# Patient Record
Sex: Female | Born: 1950 | Race: White | Hispanic: No | Marital: Married | Smoking: Never smoker
Health system: Southern US, Community
[De-identification: ages and names within clinical notes are randomized; demographics above are authoritative.]

## PROBLEM LIST (undated history)

## (undated) DIAGNOSIS — N6019 Diffuse cystic mastopathy of unspecified breast: Secondary | ICD-10-CM

## (undated) DIAGNOSIS — I10 Essential (primary) hypertension: Secondary | ICD-10-CM

## (undated) DIAGNOSIS — K589 Irritable bowel syndrome without diarrhea: Secondary | ICD-10-CM

## (undated) DIAGNOSIS — N029 Recurrent and persistent hematuria with unspecified morphologic changes: Secondary | ICD-10-CM

## (undated) DIAGNOSIS — G4733 Obstructive sleep apnea (adult) (pediatric): Secondary | ICD-10-CM

## (undated) DIAGNOSIS — H269 Unspecified cataract: Secondary | ICD-10-CM

## (undated) DIAGNOSIS — G4730 Sleep apnea, unspecified: Secondary | ICD-10-CM

## (undated) DIAGNOSIS — T7840XA Allergy, unspecified, initial encounter: Secondary | ICD-10-CM

## (undated) DIAGNOSIS — E785 Hyperlipidemia, unspecified: Secondary | ICD-10-CM

## (undated) HISTORY — DX: Irritable bowel syndrome, unspecified: K58.9

## (undated) HISTORY — DX: Recurrent and persistent hematuria with unspecified morphologic changes: N02.9

## (undated) HISTORY — DX: Hyperlipidemia, unspecified: E78.5

## (undated) HISTORY — DX: Obstructive sleep apnea (adult) (pediatric): G47.33

## (undated) HISTORY — DX: Essential (primary) hypertension: I10

## (undated) HISTORY — DX: Sleep apnea, unspecified: G47.30

## (undated) HISTORY — DX: Unspecified cataract: H26.9

## (undated) HISTORY — DX: Allergy, unspecified, initial encounter: T78.40XA

---
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
## 2007-07-24 ENCOUNTER — Encounter: Payer: Self-pay | Admitting: Primary Care

## 2007-09-07 LAB — HM COLONOSCOPY

## 2015-10-23 ENCOUNTER — Encounter: Payer: Self-pay | Admitting: Primary Care

## 2016-06-05 LAB — COLOGUARD: Cologuard: NEGATIVE

## 2016-07-08 LAB — COLOGUARD: Cologuard: NEGATIVE

## 2016-09-25 ENCOUNTER — Encounter: Payer: Self-pay | Admitting: Primary Care

## 2017-02-20 ENCOUNTER — Encounter (INDEPENDENT_AMBULATORY_CARE_PROVIDER_SITE_OTHER): Payer: Self-pay

## 2017-02-20 ENCOUNTER — Ambulatory Visit (INDEPENDENT_AMBULATORY_CARE_PROVIDER_SITE_OTHER): Payer: Medicare Other | Admitting: Primary Care

## 2017-02-20 ENCOUNTER — Encounter: Payer: Self-pay | Admitting: Primary Care

## 2017-02-20 VITALS — BP 124/74 | HR 78 | Temp 98.3°F | Ht 63.5 in | Wt 177.4 lb

## 2017-02-20 DIAGNOSIS — E785 Hyperlipidemia, unspecified: Secondary | ICD-10-CM | POA: Diagnosis not present

## 2017-02-20 DIAGNOSIS — I10 Essential (primary) hypertension: Secondary | ICD-10-CM | POA: Insufficient documentation

## 2017-02-20 DIAGNOSIS — R739 Hyperglycemia, unspecified: Secondary | ICD-10-CM

## 2017-02-20 DIAGNOSIS — K580 Irritable bowel syndrome with diarrhea: Secondary | ICD-10-CM | POA: Diagnosis not present

## 2017-02-20 DIAGNOSIS — K589 Irritable bowel syndrome without diarrhea: Secondary | ICD-10-CM | POA: Insufficient documentation

## 2017-02-20 LAB — LIPID PANEL
Cholesterol: 215 mg/dL — ABNORMAL HIGH (ref 0–200)
HDL: 41.4 mg/dL (ref >39.00)
LDL Cholesterol: 136 mg/dL — ABNORMAL HIGH (ref 0–99)
NONHDL: 173.2
Total CHOL/HDL Ratio: 5
Triglycerides: 186 mg/dL — ABNORMAL HIGH (ref 0.0–149.0)
VLDL: 37.2 mg/dL (ref 0.0–40.0)

## 2017-02-20 LAB — COMPREHENSIVE METABOLIC PANEL
ALT: 59 U/L — AB (ref 0–35)
AST: 41 U/L — AB (ref 0–37)
Albumin: 4.3 g/dL (ref 3.5–5.2)
Alkaline Phosphatase: 33 U/L — ABNORMAL LOW (ref 39–117)
BILIRUBIN TOTAL: 0.6 mg/dL (ref 0.2–1.2)
BUN: 17 mg/dL (ref 6–23)
CO2: 28 meq/L (ref 19–32)
CREATININE: 0.82 mg/dL (ref 0.40–1.20)
Calcium: 9.5 mg/dL (ref 8.4–10.5)
Chloride: 102 mEq/L (ref 96–112)
GFR: 74.26 mL/min (ref >60.00)
GLUCOSE: 141 mg/dL — AB (ref 70–99)
Potassium: 3.5 mEq/L (ref 3.5–5.1)
Sodium: 139 mEq/L (ref 135–145)
TOTAL PROTEIN: 6.7 g/dL (ref 6.0–8.3)

## 2017-02-20 LAB — HEMOGLOBIN A1C: HEMOGLOBIN A1C: 5.9 % (ref 4.6–6.5)

## 2017-02-20 NOTE — Progress Notes (Signed)
Subjective:    Patient ID: Amy Wilcox, female    DOB: October 22, 1950, 66 y.o.   MRN: [id number]  HPI  Amy Wilcox is a 66 year old female who presents today to establish care and discuss the problems mentioned below. Will obtain old records. She moved from [place] recently. Mammogram completed in   1) Hyperlipidemia: Currently managed on Zetia 10 mg. She cannot tolerate statins due to major myalgias. She's tried five different statins including Livalo. Her last lipid panel was in April 2018 with TC and LDL above goal. She started Zetia in early 2018.   2) Essential Hypertension: Currently managed on valsartan-HCTZ 80-12.5 mg and Toprol XL 50 mg. Her BP in the office is 124/74. She denies chest pain, shortness of breath. She's not checked to see if her Valsartan is on the recall list. She did recently pick up her Valsartan and was not told by the pharmacist that her medication was recalled.  3) IBS Diarrhea Type: Previously seeing GI and was once managed on Vibrizi. She will take Imodium as needed. Diarrhea is daily, sometimes several times daily. Overall she feels well managed on Imodium.   4) OSA: Diagnosed in 2003. Compliant to her CPAP machine since 2003. Her recent settings were evaluated in March 2018.  Review of Systems  Constitutional: Negative for fatigue.  Eyes: Negative for visual disturbance.  Respiratory: Negative for shortness of breath.        OSA  Cardiovascular: Negative for chest pain.  Gastrointestinal:       IBS  Neurological: Negative for dizziness and headaches.       Past Medical History:  Diagnosis Date  . Essential hypertension   . Hyperlipidemia      Social History   Social History  . Marital status: Married    Spouse name: N/A  . Number of children: N/A  . Years of education: N/A   Occupational History  . Not on file.   Social History Main Topics  . Smoking status: Not on file  . Smokeless tobacco: Not on file  . Alcohol use Not on file  . Drug  use: Unknown  . Sexual activity: Not on file   Other Topics Concern  . Not on file   Social History Narrative   Married.   3 children.   Retired. Once worked a Agricultural engineer.   Enjoys spending time with family.    Past Surgical History:  Procedure Laterality Date  . TONSILLECTOMY AND ADENOIDECTOMY  1961    Family History  Problem Relation Age of Onset  . Hypertension Mother   . Hyperlipidemia Mother   . Alzheimer's disease Mother   . Heart disease Father   . Heart attack Father   . Hyperlipidemia Father   . Hypertension Father     Allergies  Allergen Reactions  . Livalo [Pitavastatin]   . Statins     No current outpatient prescriptions on file prior to visit.   No current facility-administered medications on file prior to visit.     BP 124/74   Pulse 78   Temp 98.3 F (36.8 C) (Oral)   Ht 5' 3.5" (1.613 m)   Wt 177 lb 6.4 oz (80.5 kg)   SpO2 96%   BMI 30.93 kg/m    Objective:   Physical Exam  Constitutional: She appears well-nourished.  Neck: Neck supple.  Cardiovascular: Normal rate and regular rhythm.   Pulmonary/Chest: Effort normal and breath sounds normal.  Skin: Skin is warm and dry.  Psychiatric: She has a normal mood and affect.          Assessment & Plan:

## 2017-02-20 NOTE — Assessment & Plan Note (Signed)
TC and LDL above goal in April 2018 based off of records she brought today. Intolerant to statins. Check lipid panel today. Continue Zetia. Discussed the importance of a healthy diet and regular exercise in order for weight loss, and to reduce the risk of other medical problems.

## 2017-02-20 NOTE — Patient Instructions (Signed)
Complete lab work prior to leaving today.   I will be in touch with you regarding your next follow up visit based off of your lab results.  It was a pleasure to meet you today! Please don't hesitate to call me with any questions. Welcome to [facility]!

## 2017-02-20 NOTE — Assessment & Plan Note (Signed)
With diarrhea. Taking Imodium PRN, feels well managed.

## 2017-02-20 NOTE — Assessment & Plan Note (Signed)
Stable in the office today. Recall list provided for Valsartan check, she will check with the pharmacist. Continue valsarta-HCTZ and Toprol XL. BMP pending.

## 2017-03-17 ENCOUNTER — Other Ambulatory Visit: Payer: Self-pay

## 2017-03-17 MED ORDER — METOPROLOL SUCCINATE ER 50 MG PO TB24: 50.0000 mg | ORAL_TABLET | Freq: Every day | ORAL | 3 refills | Status: DC

## 2017-03-17 MED ORDER — EZETIMIBE 10 MG PO TABS: 10.0000 mg | ORAL_TABLET | Freq: Every day | ORAL | 3 refills | Status: DC

## 2017-03-17 MED ORDER — VALSARTAN-HYDROCHLOROTHIAZIDE 80-12.5 MG PO TABS: 1.0000 | ORAL_TABLET | Freq: Every day | ORAL | 3 refills | Status: DC

## 2017-03-17 NOTE — Telephone Encounter (Signed)
Pt established care on 02/20/17; pt has lab appt on 05/26/17. Per office note pt to continue zetia, metoprolol succinate and valsartan HCTZ 80-12.5 (pt checked and her valsartan HCTZ has not been recalled). Due to ins pt request # 30 day rx. Refilled per protocol # 30 on each with 3 refills and could be updated after lab appt on 05/26/17. Pt voiced understanding. FYI to [name] NP to verify OK. [facility]

## 2017-04-25 ENCOUNTER — Encounter: Payer: Self-pay | Admitting: Primary Care

## 2017-05-18 ENCOUNTER — Other Ambulatory Visit: Payer: Self-pay | Admitting: Primary Care

## 2017-05-18 DIAGNOSIS — E119 Type 2 diabetes mellitus without complications: Secondary | ICD-10-CM | POA: Insufficient documentation

## 2017-05-18 DIAGNOSIS — R945 Abnormal results of liver function studies: Secondary | ICD-10-CM

## 2017-05-18 DIAGNOSIS — E1165 Type 2 diabetes mellitus with hyperglycemia: Secondary | ICD-10-CM | POA: Insufficient documentation

## 2017-05-18 DIAGNOSIS — K760 Fatty (change of) liver, not elsewhere classified: Secondary | ICD-10-CM | POA: Insufficient documentation

## 2017-05-18 DIAGNOSIS — R7989 Other specified abnormal findings of blood chemistry: Secondary | ICD-10-CM

## 2017-05-18 DIAGNOSIS — R7303 Prediabetes: Secondary | ICD-10-CM | POA: Insufficient documentation

## 2017-05-18 DIAGNOSIS — E785 Hyperlipidemia, unspecified: Secondary | ICD-10-CM

## 2017-05-18 NOTE — Assessment & Plan Note (Signed)
A1C of 5.9 on prior labs in August 2018. Encouraged healthy diet and regular exercise. Repeat A1C pending.

## 2017-05-18 NOTE — Assessment & Plan Note (Signed)
Noted on labs from August 2018. Repeat LFT's pending. Consider liver US if no improvement from diet changes. Not currently on statin.

## 2017-05-26 ENCOUNTER — Other Ambulatory Visit (INDEPENDENT_AMBULATORY_CARE_PROVIDER_SITE_OTHER): Payer: Medicare Other

## 2017-05-26 DIAGNOSIS — R7303 Prediabetes: Secondary | ICD-10-CM

## 2017-05-26 DIAGNOSIS — E785 Hyperlipidemia, unspecified: Secondary | ICD-10-CM | POA: Diagnosis not present

## 2017-05-26 DIAGNOSIS — R945 Abnormal results of liver function studies: Secondary | ICD-10-CM | POA: Diagnosis not present

## 2017-05-26 DIAGNOSIS — R7989 Other specified abnormal findings of blood chemistry: Secondary | ICD-10-CM

## 2017-05-26 LAB — LIPID PANEL
CHOL/HDL RATIO: 5
Cholesterol: 232 mg/dL — ABNORMAL HIGH (ref 0–200)
HDL: 42.4 mg/dL (ref >39.00)
NONHDL: 189.68
Triglycerides: 202 mg/dL — ABNORMAL HIGH (ref 0.0–149.0)
VLDL: 40.4 mg/dL — ABNORMAL HIGH (ref 0.0–40.0)

## 2017-05-26 LAB — HEPATIC FUNCTION PANEL
ALBUMIN: 4.4 g/dL (ref 3.5–5.2)
ALK PHOS: 35 U/L — AB (ref 39–117)
ALT: 45 U/L — ABNORMAL HIGH (ref 0–35)
AST: 32 U/L (ref 0–37)
BILIRUBIN DIRECT: 0.1 mg/dL (ref 0.0–0.3)
BILIRUBIN TOTAL: 0.8 mg/dL (ref 0.2–1.2)
Total Protein: 7.2 g/dL (ref 6.0–8.3)

## 2017-05-26 LAB — HEMOGLOBIN A1C: Hgb A1c MFr Bld: 5.9 % (ref 4.6–6.5)

## 2017-05-26 LAB — LDL CHOLESTEROL, DIRECT: Direct LDL: 162 mg/dL

## 2017-09-19 ENCOUNTER — Other Ambulatory Visit: Payer: Self-pay | Admitting: Primary Care

## 2017-09-19 DIAGNOSIS — I10 Essential (primary) hypertension: Secondary | ICD-10-CM

## 2017-09-19 DIAGNOSIS — E785 Hyperlipidemia, unspecified: Secondary | ICD-10-CM

## 2017-09-19 DIAGNOSIS — Z1159 Encounter for screening for other viral diseases: Secondary | ICD-10-CM

## 2017-09-19 DIAGNOSIS — R7303 Prediabetes: Secondary | ICD-10-CM

## 2017-09-22 ENCOUNTER — Ambulatory Visit (INDEPENDENT_AMBULATORY_CARE_PROVIDER_SITE_OTHER): Payer: Medicare Other

## 2017-09-22 VITALS — BP 128/82 | HR 98 | Temp 98.9°F | Ht 62.5 in | Wt 177.5 lb

## 2017-09-22 DIAGNOSIS — R7303 Prediabetes: Secondary | ICD-10-CM | POA: Diagnosis not present

## 2017-09-22 DIAGNOSIS — I10 Essential (primary) hypertension: Secondary | ICD-10-CM | POA: Diagnosis not present

## 2017-09-22 DIAGNOSIS — E785 Hyperlipidemia, unspecified: Secondary | ICD-10-CM | POA: Diagnosis not present

## 2017-09-22 DIAGNOSIS — Z1159 Encounter for screening for other viral diseases: Secondary | ICD-10-CM

## 2017-09-22 DIAGNOSIS — Z0000 Encounter for general adult medical examination without abnormal findings: Secondary | ICD-10-CM

## 2017-09-22 LAB — COMPREHENSIVE METABOLIC PANEL
ALBUMIN: 4.5 g/dL (ref 3.5–5.2)
ALK PHOS: 32 U/L — AB (ref 39–117)
ALT: 54 U/L — ABNORMAL HIGH (ref 0–35)
AST: 35 U/L (ref 0–37)
BUN: 22 mg/dL (ref 6–23)
CALCIUM: 10.3 mg/dL (ref 8.4–10.5)
CHLORIDE: 101 meq/L (ref 96–112)
CO2: 30 mEq/L (ref 19–32)
Creatinine, Ser: 0.93 mg/dL (ref 0.40–1.20)
GFR: 64.1 mL/min (ref >60.00)
Glucose, Bld: 150 mg/dL — ABNORMAL HIGH (ref 70–99)
POTASSIUM: 3.8 meq/L (ref 3.5–5.1)
Sodium: 142 mEq/L (ref 135–145)
Total Bilirubin: 0.8 mg/dL (ref 0.2–1.2)
Total Protein: 7.4 g/dL (ref 6.0–8.3)

## 2017-09-22 LAB — LIPID PANEL
CHOL/HDL RATIO: 5
CHOLESTEROL: 231 mg/dL — AB (ref 0–200)
HDL: 43.7 mg/dL (ref >39.00)
NonHDL: 187.43
Triglycerides: 216 mg/dL — ABNORMAL HIGH (ref 0.0–149.0)
VLDL: 43.2 mg/dL — AB (ref 0.0–40.0)

## 2017-09-22 LAB — HEMOGLOBIN A1C: Hgb A1c MFr Bld: 6 % (ref 4.6–6.5)

## 2017-09-22 LAB — LDL CHOLESTEROL, DIRECT: Direct LDL: 178 mg/dL

## 2017-09-22 NOTE — Patient Instructions (Signed)
Amy Wilcox , Thank you for taking time to come for your Medicare Wellness Visit. I appreciate your ongoing commitment to your health goals. Please review the following plan we discussed and let me know if I can assist you in the future.   These are the goals we discussed: Goals    . DIET - INCREASE WATER INTAKE     Starting 09/22/2017, I will attempt to drink at least 3 glasses of water daily. Goal is 6-8 glasses of water daily.        This is a list of the screening recommended for you and due dates:  Health Maintenance  Topic Date Due  . Flu Shot  09/29/2017*  . DEXA scan (bone density measurement)  09/23/2018*  . Tetanus Vaccine  09/23/2018*  . Pneumonia vaccines (1 of 2 - PCV13) 09/23/2018*  . Mammogram  10/15/2018  . Colon Cancer Screening  01/22/2026  .  Hepatitis C: One time screening is recommended by Center for Disease Control  (CDC) for  adults born from [birth year] through 1964.   Completed  *Topic was postponed. The date shown is not the original due date.   Preventive Care for Adults  A healthy lifestyle and preventive care can promote health and wellness. Preventive health guidelines for adults include the following key practices.  . A routine yearly physical is a good way to check with your health care provider about your health and preventive screening. It is a chance to share any concerns and updates on your health and to receive a thorough exam.  . Visit your dentist for a routine exam and preventive care every 6 months. Brush your teeth twice a day and floss once a day. Good oral hygiene prevents tooth decay and gum disease.  . The frequency of eye exams is based on your age, health, family medical history, use  of contact lenses, and other factors. Follow your health care provider's recommendations for frequency of eye exams.  . Eat a healthy diet. Foods like vegetables, fruits, whole grains, low-fat dairy products, and lean protein foods contain the nutrients you need  without too many calories. Decrease your intake of foods high in solid fats, added sugars, and salt. Eat the right amount of calories for you. Get information about a proper diet from your health care provider, if necessary.  . Regular physical exercise is one of the most important things you can do for your health. Most adults should get at least 150 minutes of moderate-intensity exercise (any activity that increases your heart rate and causes you to sweat) each week. In addition, most adults need muscle-strengthening exercises on 2 or more days a week.  Silver Sneakers may be a benefit available to you. To determine eligibility, you may visit the website: www.silversneakers.com or contact program at 218 497 2651 Mon-Fri between 8AM-8PM.   . Maintain a healthy weight. The body mass index (BMI) is a screening tool to identify possible weight problems. It provides an estimate of body fat based on height and weight. Your health care provider can find your BMI and can help you achieve or maintain a healthy weight.   For adults 20 years and older: ? A BMI below 18.5 is considered underweight. ? A BMI of 18.5 to 24.9 is normal. ? A BMI of 25 to 29.9 is considered overweight. ? A BMI of 30 and above is considered obese.   . Maintain normal blood lipids and cholesterol levels by exercising and minimizing your intake of saturated fat. Eat  a balanced diet with plenty of fruit and vegetables. Blood tests for lipids and cholesterol should begin at age 28 and be repeated every 5 years. If your lipid or cholesterol levels are high, you are over 50, or you are at high risk for heart disease, you may need your cholesterol levels checked more frequently. Ongoing high lipid and cholesterol levels should be treated with medicines if diet and exercise are not working.  . If you smoke, find out from your health care provider how to quit. If you do not use tobacco, please do not start.  . If you choose to drink  alcohol, please do not consume more than 2 drinks per day. One drink is considered to be 12 ounces (355 mL) of beer, 5 ounces (148 mL) of wine, or 1.5 ounces (44 mL) of liquor.  . If you are 63-2 years old, ask your health care provider if you should take aspirin to prevent strokes.  . Use sunscreen. Apply sunscreen liberally and repeatedly throughout the day. You should seek shade when your shadow is shorter than you. Protect yourself by wearing long sleeves, pants, a wide-brimmed hat, and sunglasses year round, whenever you are outdoors.  . Once a month, do a whole body skin exam, using a mirror to look at the skin on your back. Tell your health care provider of new moles, moles that have irregular borders, moles that are larger than a pencil eraser, or moles that have changed in shape or color.

## 2017-09-22 NOTE — Progress Notes (Signed)
Subjective:   Amy Wilcox is a 67 y.o. female who presents for an Initial Medicare Annual Wellness Visit.  Review of Systems    N/A  Cardiac Risk Factors include: advanced age (>80men, >74 women);obesity (BMI >30kg/m2);hypertension;dyslipidemia;sedentary lifestyle     Objective:    Today's Vitals   09/22/17 0812  BP: 128/82  Pulse: 98  Temp: 98.9 F (37.2 C)  TempSrc: Oral  SpO2: 97%  Weight: 177 lb 8 oz (80.5 kg)  Height: 5' 2.5" (1.588 m)  PainSc: 3   PainLoc: Abdomen   Body mass index is 31.95 kg/m.  Advanced Directives 09/22/2017  Does Patient Have a Medical Advance Directive? Yes  Type of Paramedic of [place];Living will  Copy of [place] in Chart? No - copy requested    Current Medications (verified) Outpatient Encounter Medications as of 09/22/2017  Medication Sig  . aspirin EC 81 MG tablet Take 81 mg by mouth daily.  Marland Kitchen CALCIUM PO Take by mouth.  . Cholecalciferol (VITAMIN D) 2000 units CAPS Take by mouth.  . cyanocobalamin 500 MCG tablet Take 500 mcg by mouth daily.  Marland Kitchen ezetimibe (ZETIA) 10 MG tablet Take 1 tablet (10 mg total) by mouth daily.  Marland Kitchen loperamide (IMODIUM) 2 MG capsule Take 2 mg by mouth as needed for diarrhea or loose stools.  . metoprolol succinate (TOPROL-XL) 50 MG 24 hr tablet Take 1 tablet (50 mg total) by mouth daily. Take with or immediately following a meal.  . Omega-3 Fatty Acids (FISH OIL PO) Take 1,200 mg by mouth 2 (two) times daily.  . valsartan-hydrochlorothiazide (DIOVAN HCT) 80-12.5 MG tablet Take 1 tablet by mouth daily.   No facility-administered encounter medications on file as of 09/22/2017.     Allergies (verified) Livalo [pitavastatin] and Statins   History: Past Medical History:  Diagnosis Date  . Essential hypertension   . Hyperlipidemia   . IBS (irritable bowel syndrome)   . Idiopathic hematuria   . OSA (obstructive sleep apnea)    Past Surgical History:  Procedure  Laterality Date  . TONSILLECTOMY AND ADENOIDECTOMY  1961   Family History  Problem Relation Age of Onset  . Hypertension Mother   . Hyperlipidemia Mother   . Alzheimer's disease Mother   . Heart disease Father   . Heart attack Father   . Hyperlipidemia Father   . Hypertension Father    Social History   Socioeconomic History  . Marital status: Married    Spouse name: None  . Number of children: None  . Years of education: None  . Highest education level: None  Social Needs  . Financial resource strain: None  . Food insecurity - worry: None  . Food insecurity - inability: None  . Transportation needs - medical: None  . Transportation needs - non-medical: None  Occupational History  . None  Tobacco Use  . Smoking status: Never Smoker  . Smokeless tobacco: Never Used  Substance and Sexual Activity  . Alcohol use: No    Frequency: Never  . Drug use: No  . Sexual activity: Yes  Other Topics Concern  . None  Social History Narrative   Married.   3 children.   Retired. Once worked a Agricultural engineer.   Enjoys spending time with family.    Tobacco Counseling Counseling given: No   Clinical Intake:  Pre-visit preparation completed: Yes  Pain : No/denies pain Pain Score: 3      Nutritional Status: BMI > 30  Obese  Nutritional Risks: None Diabetes: No  How often do you need to have someone help you when you read instructions, pamphlets, or other written materials from your doctor or pharmacy?: 1 - Never What is the last grade level you completed in school?: 12th grade + 1 yr college  Interpreter Needed?: No  Comments: pt lives with spouse Information entered by :: LPinson, LPN   Activities of Daily Living In your present state of health, do you have any difficulty performing the following activities: 09/22/2017  Hearing? N  Vision? N  Difficulty concentrating or making decisions? N  Walking or climbing stairs? N  Dressing or bathing? N  Doing errands,  shopping? N  Preparing Food and eating ? N  Using the Toilet? N  In the past six months, have you accidently leaked urine? N  Do you have problems with loss of bowel control? N  Managing your Medications? N  Managing your Finances? N  Housekeeping or managing your Housekeeping? N  Some recent data might be hidden     Immunizations and Health Maintenance  There is no immunization history on file for this patient. There are no preventive care reminders to display for this patient.  Patient Care Team: [name], NP as PCP - General (Internal Medicine)  Indicate any recent Medical Services you may have received from other than [facility] providers in the past year (date may be approximate).     Assessment:   This is a routine wellness examination for Amy Wilcox.   Hearing Screening   125Hz  250Hz  500Hz  1000Hz  2000Hz  3000Hz  4000Hz  6000Hz  8000Hz   Right ear:   40 40 40  40    Left ear:   40 40 40  40      Visual Acuity Screening   Right eye Left eye Both eyes  Without correction:     With correction: 20/25-1 20/30 20/25     Hearing/Vision screen  Hearing Screening   125Hz  250Hz  500Hz  1000Hz  2000Hz  3000Hz  4000Hz  6000Hz  8000Hz   Right ear:   40 40 40  40    Left ear:   40 40 40  40      Visual Acuity Screening   Right eye Left eye Both eyes  Without correction:     With correction: 20/25-1 20/30 20/25    Dietary issues and exercise activities discussed: Current Exercise Habits: The patient does not participate in regular exercise at present, Exercise limited by: None identified  Goals    . DIET - INCREASE WATER INTAKE     Starting 09/22/2017, I will attempt to drink at least 3 glasses of water daily. Goal is 6-8 glasses of water daily.       Depression Screen PHQ 2/9 Scores 09/22/2017  PHQ - 2 Score 0  PHQ- 9 Score 0    Fall Risk Fall Risk  09/22/2017  Falls in the past year? No   Cognitive Function: MMSE - Mini Mental State Exam 09/22/2017  Orientation to time 5    Orientation to Place 5  Registration 3  Attention/ Calculation 0  Recall 3  Language- name 2 objects 0  Language- repeat 1  Language- follow 3 step command 3  Language- read & follow direction 0  Write a sentence 0  Copy design 0  Total score 20     PLEASE NOTE: A Mini-Cog screen was completed. Maximum score is 20. A value of 0 denotes this part of [place] MMSE was not completed or the patient failed this part of  the Mini-Cog screening.   Mini-Cog Screening Orientation to Time - Max 5 pts Orientation to Place - Max 5 pts Registration - Max 3 pts Recall - Max 3 pts Language Repeat - Max 1 pts Language Follow 3 Step Command - Max 3 pts     Screening Tests Health Maintenance  Topic Date Due  . INFLUENZA VACCINE  09/29/2017 (Originally 02/05/2017)  . DEXA SCAN  09/23/2018 (Originally 09/06/2016)  . TETANUS/TDAP  09/23/2018 (Originally 09/07/1970)  . PNA vac Low Risk Adult (1 of 2 - PCV13) 09/23/2018 (Originally 09/06/2016)  . MAMMOGRAM  10/15/2018  . COLONOSCOPY  01/22/2026  . Hepatitis C Screening  Completed       Plan:   I have personally reviewed, addressed, and noted the following in the patient's chart:  A. Medical and social history B. Use of alcohol, tobacco or illicit drugs  C. Current medications and supplements D. Functional ability and status E.  Nutritional status F.  Physical activity G. Advance directives H. List of other physicians I.  Hospitalizations, surgeries, and ER visits in previous 12 months J.  [place] to include hearing, vision, cognitive, depression L. Referrals and appointments - none  In addition, I have reviewed and discussed with patient certain preventive protocols, quality metrics, and best practice recommendations. A written personalized care plan for preventive services as well as general preventive health recommendations were provided to patient.  See attached scanned questionnaire for additional information.   Signed,    Lindell Noe, MHA, BS, LPN Health Coach

## 2017-09-22 NOTE — Progress Notes (Signed)
PCP notes:   Health maintenance:  Vaccination records will be updated upon review of previous medical records.   Bone density - PCP please address at next appt Flu vaccine - PCP please address at next appt  Abnormal screenings:   None  Patient concerns:   Pt verbalized concerns with IBS with diarrhea.   Nurse concerns:  None  Next PCP appt:   09/29/2017 @ 0815

## 2017-09-23 LAB — HEPATITIS C ANTIBODY
Hepatitis C Ab: NONREACTIVE
SIGNAL TO CUT-OFF: 0.02 (ref <1.00)

## 2017-09-28 NOTE — Progress Notes (Signed)
I reviewed health advisor's note, was available for consultation, and agree with documentation and plan.  

## 2017-09-29 ENCOUNTER — Encounter: Payer: Self-pay | Admitting: Primary Care

## 2017-09-29 ENCOUNTER — Ambulatory Visit (INDEPENDENT_AMBULATORY_CARE_PROVIDER_SITE_OTHER): Payer: Medicare Other | Admitting: Primary Care

## 2017-09-29 VITALS — BP 124/78 | HR 76 | Temp 98.2°F | Ht 62.5 in | Wt 177.5 lb

## 2017-09-29 DIAGNOSIS — R7989 Other specified abnormal findings of blood chemistry: Secondary | ICD-10-CM

## 2017-09-29 DIAGNOSIS — E785 Hyperlipidemia, unspecified: Secondary | ICD-10-CM

## 2017-09-29 DIAGNOSIS — K580 Irritable bowel syndrome with diarrhea: Secondary | ICD-10-CM | POA: Diagnosis not present

## 2017-09-29 DIAGNOSIS — I10 Essential (primary) hypertension: Secondary | ICD-10-CM

## 2017-09-29 DIAGNOSIS — Z1239 Encounter for other screening for malignant neoplasm of breast: Secondary | ICD-10-CM

## 2017-09-29 DIAGNOSIS — Z1231 Encounter for screening mammogram for malignant neoplasm of breast: Secondary | ICD-10-CM | POA: Diagnosis not present

## 2017-09-29 DIAGNOSIS — G4733 Obstructive sleep apnea (adult) (pediatric): Secondary | ICD-10-CM | POA: Diagnosis not present

## 2017-09-29 DIAGNOSIS — R7303 Prediabetes: Secondary | ICD-10-CM | POA: Diagnosis not present

## 2017-09-29 DIAGNOSIS — R945 Abnormal results of liver function studies: Secondary | ICD-10-CM

## 2017-09-29 DIAGNOSIS — E2839 Other primary ovarian failure: Secondary | ICD-10-CM | POA: Diagnosis not present

## 2017-09-29 MED ORDER — DICYCLOMINE HCL 10 MG PO CAPS: ORAL_CAPSULE | ORAL | 0 refills | Status: DC

## 2017-09-29 NOTE — Patient Instructions (Addendum)
Try dicyclomine 10 mg capsules for irritable bowel syndrome. Take 1 capsule by mouth prior to meals as needed for IBS.  Call the [facility] to schedule your mammogram and bone density.   Start exercising. You should be getting 150 minutes of moderate intensity exercise weekly.  It's important to improve your diet by reducing consumption of fast food, fried food, processed snack foods, sugary drinks. Increase consumption of fresh vegetables and fruits, whole grains, water.  Ensure you are drinking 64 ounces of water daily.  Please check on the dates for your tetanus, pneumonia, shingles.   Schedule a lab only appointment in 6 months to have your cholesterol, blood sugar, liver enzymes repeated. Work hard on [employer] as discussed.  It was a pleasure to see you today!

## 2017-09-29 NOTE — Assessment & Plan Note (Signed)
Stable in the office today, continue Diovan and metoprolol succinate.

## 2017-09-29 NOTE — Assessment & Plan Note (Signed)
Continued symptoms of cramping, diarrhea, constipation, bloating. Once on Vybrizi but could not afford. Rx for dicyclomine sent to pharmacy for her to try, she'll update.

## 2017-09-29 NOTE — Assessment & Plan Note (Signed)
Slight increase in ALT from November 2018. She endorses a poor diet and no exercise, will repeat in 6 months. If still elevated then consider ultrasound.

## 2017-09-29 NOTE — Progress Notes (Signed)
Subjective:    Patient ID: Amy Wilcox, female    DOB: 1950/10/02, 67 y.o.   MRN: [id number]  HPI  Amy Wilcox is a 67 year old female who presents today for [place] Part 2. She saw our health advisor last week.    Immunizations: -Tetanus: Completed within the last 5 years  -Pneumonia: Thinks she completed one vaccination -Shingles: Thinks she completed one vaccination  Colonoscopy: Completed Cologuard in 2018, negative Dexa: Completed over 2-3 years ago.  Mammogram: Completed in April 2018 Hep C Screen: Negative  The 10-year ASCVD risk score Mikey Bussing DC Jr., et al., 2013) is: 9.1%   Values used to calculate the score:     Age: 58 years     Sex: Female     Is Non-Hispanic African American: No     Diabetic: No     Tobacco smoker: No     Systolic Blood Pressure: 263 mmHg     Is BP treated: Yes     HDL Cholesterol: 43.7 mg/dL     Total Cholesterol: 231 mg/dL    Review of Systems  Constitutional: Negative for unexpected weight change.  HENT: Negative for rhinorrhea.   Respiratory: Negative for cough and shortness of breath.   Cardiovascular: Negative for chest pain.  Gastrointestinal:       Intermittent abdominal cramping with diarrhea/constipation.   Genitourinary: Negative for difficulty urinating.  Musculoskeletal: Negative for arthralgias and myalgias.  Skin: Negative for rash.  Allergic/Immunologic: Negative for environmental allergies.  Neurological: Negative for dizziness, numbness and headaches.       Past Medical History:  Diagnosis Date  . Essential hypertension   . Hyperlipidemia   . IBS (irritable bowel syndrome)   . Idiopathic hematuria   . OSA (obstructive sleep apnea)      Social History   Socioeconomic History  . Marital status: Married    Spouse name: Not on file  . Number of children: Not on file  . Years of education: Not on file  . Highest education level: Not on file  Occupational History  . Not on file  Social Needs  . Financial resource  strain: Not on file  . Food insecurity:    Worry: Not on file    Inability: Not on file  . Transportation needs:    Medical: Not on file    Non-medical: Not on file  Tobacco Use  . Smoking status: Never Smoker  . Smokeless tobacco: Never Used  Substance and Sexual Activity  . Alcohol use: No    Frequency: Never  . Drug use: No  . Sexual activity: Yes  Lifestyle  . Physical activity:    Days per week: Not on file    Minutes per session: Not on file  . Stress: Not on file  Relationships  . Social connections:    Talks on phone: Not on file    Gets together: Not on file    Attends religious service: Not on file    Active member of club or organization: Not on file    Attends meetings of clubs or organizations: Not on file    Relationship status: Not on file  . Intimate partner violence:    Fear of current or ex partner: Not on file    Emotionally abused: Not on file    Physically abused: Not on file    Forced sexual activity: Not on file  Other Topics Concern  . Not on file  Social History Narrative   Married.  3 children.   Retired. Once worked a Agricultural engineer.   Enjoys spending time with family.    Past Surgical History:  Procedure Laterality Date  . TONSILLECTOMY AND ADENOIDECTOMY  1961    Family History  Problem Relation Age of Onset  . Hypertension Mother   . Hyperlipidemia Mother   . Alzheimer's disease Mother   . Heart disease Father   . Heart attack Father   . Hyperlipidemia Father   . Hypertension Father     Allergies  Allergen Reactions  . Livalo [Pitavastatin]   . Statins     Current Outpatient Medications on File Prior to Visit  Medication Sig Dispense Refill  . aspirin EC 81 MG tablet Take 81 mg by mouth daily.    Marland Kitchen CALCIUM PO Take by mouth.    . Cholecalciferol (VITAMIN D) 2000 units CAPS Take by mouth.    . cyanocobalamin 500 MCG tablet Take 500 mcg by mouth daily.    Marland Kitchen ezetimibe (ZETIA) 10 MG tablet Take 1 tablet (10 mg total) by mouth  daily. 30 tablet 3  . loperamide (IMODIUM) 2 MG capsule Take 1 mg by mouth at bedtime.     . metoprolol succinate (TOPROL-XL) 50 MG 24 hr tablet Take 1 tablet (50 mg total) by mouth daily. Take with or immediately following a meal. 30 tablet 3  . Omega-3 Fatty Acids (FISH OIL PO) Take 1,200 mg by mouth 2 (two) times daily.    . valsartan-hydrochlorothiazide (DIOVAN HCT) 80-12.5 MG tablet Take 1 tablet by mouth daily. 30 tablet 3   No current facility-administered medications on file prior to visit.     BP 124/78   Pulse 76   Temp 98.2 F (36.8 C) (Oral)   Ht 5' 2.5" (1.588 m)   Wt 177 lb 8 oz (80.5 kg)   SpO2 98%   BMI 31.95 kg/m    Objective:   Physical Exam  Constitutional: She is oriented to person, place, and time. She appears well-nourished.  HENT:  Right Ear: Tympanic membrane and ear canal normal.  Left Ear: Tympanic membrane and ear canal normal.  Nose: Nose normal.  Mouth/Throat: Oropharynx is clear and moist.  Eyes: Pupils are equal, round, and reactive to light. Conjunctivae and EOM are normal.  Neck: Neck supple. No thyromegaly present.  Cardiovascular: Normal rate and regular rhythm.  No murmur heard. Pulmonary/Chest: Effort normal and breath sounds normal. She has no rales.  Abdominal: Soft. Bowel sounds are normal. There is no tenderness.  Musculoskeletal: Normal range of motion.  Lymphadenopathy:    She has no cervical adenopathy.  Neurological: She is alert and oriented to person, place, and time. She has normal reflexes. No cranial nerve deficit.  Skin: Skin is warm and dry. No rash noted.  Benign appearing nevus to right anterior shoulder  Psychiatric: She has a normal mood and affect.          Assessment & Plan:

## 2017-09-29 NOTE — Assessment & Plan Note (Signed)
A1C of 6.0 on recent labs, will have her work on lifestyle changes and repeat in 6 months.

## 2017-09-29 NOTE — Assessment & Plan Note (Signed)
Complaint to CPAP machine, needing Rx for new nasal pillows and hose. Rx provided.

## 2017-09-29 NOTE — Assessment & Plan Note (Addendum)
Above goal on recent labs, she is intolerant to statin medications. She endorses a poor diet and is not exercising, will have her work on lifestyle changes. Continue Zetia and Fish Oil. Repeat lipids in 6 months.   Discussed ascvd risk score of 9.1%, she verbalized understanding.

## 2017-10-02 ENCOUNTER — Other Ambulatory Visit: Payer: Self-pay | Admitting: Primary Care

## 2017-10-02 ENCOUNTER — Ambulatory Visit (INDEPENDENT_AMBULATORY_CARE_PROVIDER_SITE_OTHER): Payer: Medicare Other | Admitting: *Deleted

## 2017-10-02 DIAGNOSIS — Z23 Encounter for immunization: Secondary | ICD-10-CM | POA: Diagnosis not present

## 2017-10-02 DIAGNOSIS — K580 Irritable bowel syndrome with diarrhea: Secondary | ICD-10-CM

## 2017-10-03 ENCOUNTER — Encounter: Payer: Self-pay | Admitting: Primary Care

## 2017-10-05 ENCOUNTER — Other Ambulatory Visit: Payer: Self-pay | Admitting: Primary Care

## 2017-10-08 ENCOUNTER — Encounter: Payer: Self-pay | Admitting: Primary Care

## 2017-10-08 ENCOUNTER — Ambulatory Visit (INDEPENDENT_AMBULATORY_CARE_PROVIDER_SITE_OTHER): Payer: Medicare Other | Admitting: Primary Care

## 2017-10-08 VITALS — BP 136/82 | HR 84 | Temp 98.1°F | Ht 62.5 in | Wt 180.5 lb

## 2017-10-08 DIAGNOSIS — D229 Melanocytic nevi, unspecified: Secondary | ICD-10-CM | POA: Diagnosis not present

## 2017-10-08 DIAGNOSIS — L820 Inflamed seborrheic keratosis: Secondary | ICD-10-CM | POA: Diagnosis not present

## 2017-10-08 NOTE — Patient Instructions (Signed)
Keep the wound clean and dry, protected with a large band-aid.  Please call me if you develop redness, drainage, pain to the site. Please call me if you develop fevers.   It was a pleasure to see you today!

## 2017-10-08 NOTE — Progress Notes (Signed)
Subjective:    Patient ID: Amy Wilcox, female    DOB: 08/12/1950, 67 y.o.   MRN: [id number]  HPI  [name] is a 67 year old female who presents today for removal of nevus. The nevus is located to the right posterior shoulder that has been present for years. She denies changes in size, color, shape. The nevus is bothersome due to location and she'd like it removed.  Review of Systems  Constitutional: Negative for fever.  Skin: Negative for color change.       nevus       Past Medical History:  Diagnosis Date  . Essential hypertension   . Hyperlipidemia   . IBS (irritable bowel syndrome)   . Idiopathic hematuria   . OSA (obstructive sleep apnea)      Social History   Socioeconomic History  . Marital status: Married    Spouse name: Not on file  . Number of children: Not on file  . Years of education: Not on file  . Highest education level: Not on file  Occupational History  . Not on file  Social Needs  . Financial resource strain: Not on file  . Food insecurity:    Worry: Not on file    Inability: Not on file  . Transportation needs:    Medical: Not on file    Non-medical: Not on file  Tobacco Use  . Smoking status: Never Smoker  . Smokeless tobacco: Never Used  Substance and Sexual Activity  . Alcohol use: No    Frequency: Never  . Drug use: No  . Sexual activity: Yes  Lifestyle  . Physical activity:    Days per week: Not on file    Minutes per session: Not on file  . Stress: Not on file  Relationships  . Social connections:    Talks on phone: Not on file    Gets together: Not on file    Attends religious service: Not on file    Active member of club or organization: Not on file    Attends meetings of clubs or organizations: Not on file    Relationship status: Not on file  . Intimate partner violence:    Fear of current or ex partner: Not on file    Emotionally abused: Not on file    Physically abused: Not on file    Forced sexual activity: Not on  file  Other Topics Concern  . Not on file  Social History Narrative   Married.   3 children.   Retired. Once worked a Agricultural engineer.   Enjoys spending time with family.    Past Surgical History:  Procedure Laterality Date  . TONSILLECTOMY AND ADENOIDECTOMY  1961    Family History  Problem Relation Age of Onset  . Hypertension Mother   . Hyperlipidemia Mother   . Alzheimer's disease Mother   . Heart disease Father   . Heart attack Father   . Hyperlipidemia Father   . Hypertension Father     Allergies  Allergen Reactions  . Livalo [Pitavastatin]   . Statins     Current Outpatient Medications on File Prior to Visit  Medication Sig Dispense Refill  . aspirin EC 81 MG tablet Take 81 mg by mouth daily.    Marland Kitchen CALCIUM PO Take by mouth.    . Cholecalciferol (VITAMIN D) 2000 units CAPS Take by mouth.    . cyanocobalamin 500 MCG tablet Take 500 mcg by mouth daily.    Marland Kitchen dicyclomine (  BENTYL) 10 MG capsule Take 1 capsule by mouth three times daily prior to meals as needed for IBS. 30 capsule 0  . ezetimibe (ZETIA) 10 MG tablet TAKE 1 TABLET BY MOUTH EVERY DAY 30 tablet 5  . loperamide (IMODIUM) 2 MG capsule Take 1 mg by mouth at bedtime.     . metoprolol succinate (TOPROL-XL) 50 MG 24 hr tablet TAKE 1 TABLET (50 MG TOTAL) BY MOUTH DAILY. TAKE WITH OR IMMEDIATELY FOLLOWING A MEAL. 30 tablet 5  . Omega-3 Fatty Acids (FISH OIL PO) Take 1,200 mg by mouth 2 (two) times daily.    . valsartan-hydrochlorothiazide (DIOVAN HCT) 80-12.5 MG tablet Take 1 tablet by mouth daily. 30 tablet 3   No current facility-administered medications on file prior to visit.     BP 136/82   Pulse 84   Temp 98.1 F (36.7 C) (Oral)   Ht 5' 2.5" (1.588 m)   Wt 180 lb 8 oz (81.9 kg)   SpO2 98%   BMI 32.49 kg/m    Objective:   Physical Exam  Cardiovascular: Normal rate.  Pulmonary/Chest: Effort normal.  Skin: Skin is warm and dry. No erythema.     1 cm rounded, brown nevus to right posterior shoulder.  Non tender.          Assessment & Plan:  Nevus Removal:  Benign appearing nevus to right posterior shoulder. Consent obtained. Site cleansed, sprayed with pain ease. Injected 1% of lidocaine with epi injected around nevus for analgesia. Nevus shaved with dermablade Cautery used for bleeding. Site cleansed, dressed. Instructions provided for home care. Specimen sent off for testing. Patient tolerated well.  [name], NP

## 2017-10-15 ENCOUNTER — Other Ambulatory Visit: Payer: Self-pay | Admitting: Primary Care

## 2017-10-17 ENCOUNTER — Encounter: Payer: Self-pay | Admitting: Primary Care

## 2017-10-17 DIAGNOSIS — K580 Irritable bowel syndrome with diarrhea: Secondary | ICD-10-CM

## 2017-10-23 ENCOUNTER — Other Ambulatory Visit: Payer: Medicare Other

## 2017-11-11 ENCOUNTER — Ambulatory Visit
Admission: RE | Admit: 2017-11-11 | Discharge: 2017-11-11 | Disposition: A | Discharge status: Home / Self Care | Payer: Medicare Other | Source: Ambulatory Visit | Attending: Primary Care | Admitting: Primary Care

## 2017-11-11 DIAGNOSIS — M8588 Other specified disorders of bone density and structure, other site: Secondary | ICD-10-CM | POA: Insufficient documentation

## 2017-11-11 DIAGNOSIS — Z780 Asymptomatic menopausal state: Secondary | ICD-10-CM | POA: Diagnosis not present

## 2017-11-11 DIAGNOSIS — M85851 Other specified disorders of bone density and structure, right thigh: Secondary | ICD-10-CM | POA: Insufficient documentation

## 2017-11-11 DIAGNOSIS — E2839 Other primary ovarian failure: Secondary | ICD-10-CM | POA: Diagnosis not present

## 2017-11-11 DIAGNOSIS — M8589 Other specified disorders of bone density and structure, multiple sites: Secondary | ICD-10-CM | POA: Diagnosis not present

## 2017-11-11 DIAGNOSIS — Z1231 Encounter for screening mammogram for malignant neoplasm of breast: Secondary | ICD-10-CM | POA: Insufficient documentation

## 2017-11-11 DIAGNOSIS — Z1239 Encounter for other screening for malignant neoplasm of breast: Secondary | ICD-10-CM

## 2017-11-11 HISTORY — DX: Diffuse cystic mastopathy of unspecified breast: N60.19

## 2017-11-19 ENCOUNTER — Other Ambulatory Visit: Payer: Self-pay | Admitting: *Deleted

## 2017-11-19 ENCOUNTER — Inpatient Hospital Stay
Admission: RE | Admit: 2017-11-19 | Discharge: 2017-11-19 | Disposition: A | Discharge status: Home / Self Care | Payer: Self-pay | Source: Ambulatory Visit | Attending: *Deleted | Admitting: *Deleted

## 2017-11-19 DIAGNOSIS — Z9289 Personal history of other medical treatment: Secondary | ICD-10-CM

## 2017-12-02 ENCOUNTER — Ambulatory Visit (INDEPENDENT_AMBULATORY_CARE_PROVIDER_SITE_OTHER): Payer: Medicare Other | Admitting: Gastroenterology

## 2017-12-02 ENCOUNTER — Encounter: Payer: Self-pay | Admitting: Gastroenterology

## 2017-12-02 ENCOUNTER — Other Ambulatory Visit: Payer: Self-pay

## 2017-12-02 VITALS — BP 144/77 | HR 68 | Temp 98.0°F | Ht 62.5 in | Wt 178.6 lb

## 2017-12-02 DIAGNOSIS — R197 Diarrhea, unspecified: Secondary | ICD-10-CM

## 2017-12-02 DIAGNOSIS — R7989 Other specified abnormal findings of blood chemistry: Secondary | ICD-10-CM

## 2017-12-02 DIAGNOSIS — R945 Abnormal results of liver function studies: Secondary | ICD-10-CM

## 2017-12-02 DIAGNOSIS — K580 Irritable bowel syndrome with diarrhea: Secondary | ICD-10-CM

## 2017-12-02 MED ORDER — AMITRIPTYLINE HCL 50 MG PO TABS: 50.0000 mg | ORAL_TABLET | Freq: Every day | ORAL | 1 refills | Status: DC

## 2017-12-02 NOTE — Progress Notes (Signed)
Cephas Darby, MD [address]  [place], [place] [address]  Main: [phone]  Fax: [phone]    Gastroenterology Consultation  Referring Provider:     [name], NP Primary Care Physician:  [name], NP Primary Gastroenterologist:  Dr. Cephas Darby Reason for Consultation:     Increased bowel frequency        HPI:   Amy Wilcox is a 67 y.o. Caucasian female with metabolic syndrome referred by Dr. Carlis Abbott, Leticia Penna, NP  for consultation & management of chronic diarrhea. She describes sporadic episodes of non bloody diarrhea, approximately 5/day a/w abdominal cramps. Stools are of variable consistency. Certain foods like raw onions, cream cheese, spicy foods, raw fruits and vegetables, a/w bloating. These occur approximately upto 3 times/month. Also, has constant upper abdominal discomfort, and epigastric burning, on pepcid as needed. Takes imodium daily at bedtime. The symptoms have been chronic and occurring for the past 10 years. She underwent colonoscopy in [place] in 2009 and was normal. She reports that her symptoms got worse and her last colonoscopy and would prefer not to undergo another colonoscopy. Random biopsies was not performed at that time. She does not recall if she was tested for celiac disease or H. pylori infection. She did not have EGD in the past. She is not losing weight. She denies nausea or vomiting, use of antibiotics, sick contacts or recent travel. She is retired and moved along with her husband from [place] about 10 years ago. She has elderly parents and has to travel frequently to [place] which she finds it quite stressful.  She reports that she is trying to eat healthy food.  She is also found to have mildly elevated transaminases since 02/2017. She also has hyperlipidemia and unable to tolerate statins. He is currently on ezetimibe. HCV antibody negative. She denies alcohol use or IV drug abuse  NSAIDs:  None  Antiplts/Anticoagulants/Anti thrombotics: None  GI Procedures: Colonoscopy in 2009 in [place], normal, biopsies were not performed She denies family history of GI malignancy, inflammatory bowel disease or celiac disease  Past Medical History:  Diagnosis Date  . Essential hypertension   . Fibrocystic breast   . Hyperlipidemia   . IBS (irritable bowel syndrome)   . Idiopathic hematuria   . OSA (obstructive sleep apnea)     Past Surgical History:  Procedure Laterality Date  . TONSILLECTOMY AND ADENOIDECTOMY  1961    Current Outpatient Medications:  .  aspirin EC 81 MG tablet, Take 81 mg by mouth daily., Disp: , Rfl:  .  CALCIUM PO, Take by mouth., Disp: , Rfl:  .  Cholecalciferol (VITAMIN D) 2000 units CAPS, Take by mouth., Disp: , Rfl:  .  cyanocobalamin 500 MCG tablet, Take 500 mcg by mouth daily., Disp: , Rfl:  .  ezetimibe (ZETIA) 10 MG tablet, TAKE 1 TABLET BY MOUTH EVERY DAY, Disp: 30 tablet, Rfl: 5 .  famotidine (PEPCID) 20 MG tablet, Take 20 mg by mouth daily as needed for heartburn or indigestion., Disp: , Rfl:  .  loperamide (IMODIUM) 2 MG capsule, Take 1 mg by mouth at bedtime. , Disp: , Rfl:  .  metoprolol succinate (TOPROL-XL) 50 MG 24 hr tablet, TAKE 1 TABLET (50 MG TOTAL) BY MOUTH DAILY. TAKE WITH OR IMMEDIATELY FOLLOWING A MEAL., Disp: 30 tablet, Rfl: 5 .  Omega-3 Fatty Acids (FISH OIL PO), Take 1,200 mg by mouth 2 (two) times daily., Disp: , Rfl:  .  valsartan-hydrochlorothiazide (DIOVAN-HCT) 80-12.5 MG tablet, TAKE 1 TABLET BY MOUTH EVERY DAY, Disp: 90 tablet, Rfl: 1 .  amitriptyline (ELAVIL) 50 MG tablet, Take 1 tablet (50 mg total) by mouth at bedtime., Disp: 30 tablet, Rfl: 1 .  dicyclomine (BENTYL) 10 MG capsule, Take 1 capsule by mouth three times daily prior to meals as needed for IBS. (Patient not taking: Reported on 12/02/2017), Disp: 30 capsule, Rfl: 0    Family History  Problem Relation Age of Onset  . Hypertension Mother   . Hyperlipidemia  Mother   . Alzheimer's disease Mother   . Heart disease Father   . Heart attack Father   . Hyperlipidemia Father   . Hypertension Father      Social History   Tobacco Use  . Smoking status: Never Smoker  . Smokeless tobacco: Never Used  Substance Use Topics  . Alcohol use: No    Frequency: Never  . Drug use: No    Allergies as of 12/02/2017 - Review Complete 12/02/2017  Allergen Reaction Noted  . Livalo [pitavastatin]  02/20/2017  . Statins  02/20/2017    Review of Systems:    All systems reviewed and negative except where noted in HPI.   Physical Exam:  BP (!) 144/77   Pulse 68   Temp 98 F (36.7 C) (Oral)   Ht 5' 2.5" (1.588 m)   Wt 178 lb 9.6 oz (81 kg)   BMI 32.15 kg/m  No LMP recorded. Patient is postmenopausal.  General:   Alert,  Well-developed, well-nourished, pleasant and cooperative in NAD Head:  Normocephalic and atraumatic. Eyes:  Sclera clear, no icterus.   Conjunctiva pink. Ears:  Normal auditory acuity. Nose:  No deformity, discharge, or lesions. Mouth:  No deformity or lesions,oropharynx pink & moist. Neck:  Supple; no masses or thyromegaly. Lungs:  Respirations even and unlabored.  Clear throughout to auscultation.   No wheezes, crackles, or rhonchi. No acute distress. Heart:  Regular rate and rhythm; no murmurs, clicks, rubs, or gallops. Abdomen:  Normal bowel sounds. Soft, non-tender and non-distended without masses, hepatosplenomegaly or hernias noted.  No guarding or rebound tenderness.   Rectal: Not performed Msk:  Symmetrical without gross deformities. Good, equal movement & strength bilaterally. Pulses:  Normal pulses noted. Extremities:  No clubbing or edema.  No cyanosis. Neurologic:  Alert and oriented x3;  grossly normal neurologically. Skin:  Intact without significant lesions or rashes. No jaundice. Lymph Nodes:  No significant cervical adenopathy. Psych:  Alert and cooperative. Normal mood and affect.  Imaging Studies: No  abdominal imaging available  Assessment and Plan:   Amy Wilcox is a 67 y.o.  White female with metabolic syndrome, presents with chronic history of nonbloody diarrhea with abdominal bloating, cramps and epigastric discomfort without other alarm signs or symptoms. No evidence of iron deficiency anemia. 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 Other differentials include celiac disease or H. Pylori infection or microscopic colitis or inflammatory bowel disease or SIBO or less likely infectious diarrhea   - Recommend GI pathogen panel, stool for C. difficile, H. pylori stool antigen testing  - check celiac serologies and CRP, fecal calprotectin  - Recommend trial of amitriptyline 50 mg at bedtime - Recommend trial of probiotics, VSL #3  Elevated LFTs: Probably secondary to fatty liver with history of metabolic syndrome Recommend secondary liver disease workup Ultrasound right upper quadrant with elastography    Follow up in 4 weeks  Cephas Darby, MD

## 2017-12-09 ENCOUNTER — Telehealth: Payer: Self-pay | Admitting: Gastroenterology

## 2017-12-09 NOTE — Telephone Encounter (Signed)
[facility] -  Mia called regarding prior auth for amitriptyline 510 mg. Please call her at [phone].

## 2017-12-15 NOTE — Telephone Encounter (Signed)
Prior authorization has been submitted, waiting for response from cover my meds

## 2017-12-17 ENCOUNTER — Other Ambulatory Visit
Admission: RE | Admit: 2017-12-17 | Discharge: 2017-12-17 | Disposition: A | Discharge status: Home / Self Care | Payer: Medicare Other | Source: Ambulatory Visit | Attending: Gastroenterology | Admitting: Gastroenterology

## 2017-12-17 DIAGNOSIS — R197 Diarrhea, unspecified: Secondary | ICD-10-CM | POA: Insufficient documentation

## 2017-12-17 DIAGNOSIS — R945 Abnormal results of liver function studies: Secondary | ICD-10-CM | POA: Diagnosis not present

## 2017-12-17 DIAGNOSIS — R7989 Other specified abnormal findings of blood chemistry: Secondary | ICD-10-CM

## 2017-12-17 LAB — GASTROINTESTINAL PANEL BY PCR, STOOL (REPLACES STOOL CULTURE)
ADENOVIRUS F40/41: NOT DETECTED
ASTROVIRUS: NOT DETECTED
CAMPYLOBACTER SPECIES: NOT DETECTED
Cryptosporidium: NOT DETECTED
Cyclospora cayetanensis: NOT DETECTED
ENTEROAGGREGATIVE E COLI (EAEC): NOT DETECTED
ENTEROTOXIGENIC E COLI (ETEC): NOT DETECTED
Entamoeba histolytica: NOT DETECTED
Enteropathogenic E coli (EPEC): NOT DETECTED
GIARDIA LAMBLIA: NOT DETECTED
NOROVIRUS GI/GII: NOT DETECTED
PLESIMONAS SHIGELLOIDES: NOT DETECTED
ROTAVIRUS A: NOT DETECTED
Salmonella species: NOT DETECTED
Sapovirus (I, II, IV, and V): NOT DETECTED
Shiga like toxin producing E coli (STEC): NOT DETECTED
Shigella/Enteroinvasive E coli (EIEC): NOT DETECTED
Vibrio cholerae: NOT DETECTED
Vibrio species: NOT DETECTED
Yersinia enterocolitica: NOT DETECTED

## 2017-12-17 LAB — C DIFFICILE QUICK SCREEN W PCR REFLEX
C DIFFICILE (CDIFF) INTERP: NOT DETECTED
C DIFFICILE (CDIFF) TOXIN: NEGATIVE
C Diff antigen: NEGATIVE

## 2017-12-17 LAB — FERRITIN: FERRITIN: 236 ng/mL (ref 11–307)

## 2017-12-17 LAB — C-REACTIVE PROTEIN: CRP: <0.8 mg/dL (ref <1.0)

## 2017-12-18 ENCOUNTER — Encounter: Payer: Self-pay | Admitting: Gastroenterology

## 2017-12-18 ENCOUNTER — Other Ambulatory Visit: Payer: Self-pay | Admitting: Gastroenterology

## 2017-12-18 DIAGNOSIS — K580 Irritable bowel syndrome with diarrhea: Secondary | ICD-10-CM

## 2017-12-18 LAB — MITOCHONDRIAL ANTIBODIES: Mitochondrial M2 Ab, IgG: <20 Units (ref 0.0–20.0)

## 2017-12-18 LAB — HEPATITIS PANEL, ACUTE
HEP B S AG: NEGATIVE
Hep A IgM: NEGATIVE
Hep B C IgM: NEGATIVE

## 2017-12-18 LAB — ANA: Anti Nuclear Antibody(ANA): NEGATIVE

## 2017-12-18 LAB — IGA: IgA: 218 mg/dL (ref 87–352)

## 2017-12-18 LAB — CERULOPLASMIN: CERULOPLASMIN: 27 mg/dL (ref 19.0–39.0)

## 2017-12-18 LAB — ANTI-SMOOTH MUSCLE ANTIBODY, IGG: F-ACTIN AB IGG: 9 U (ref 0–19)

## 2017-12-18 MED ORDER — SERTRALINE HCL 50 MG PO TABS: 50.0000 mg | ORAL_TABLET | Freq: Every day | ORAL | 0 refills | Status: DC

## 2017-12-19 LAB — H. PYLORI ANTIGEN, STOOL: H. Pylori Stool Ag, Eia: NEGATIVE

## 2017-12-19 LAB — TISSUE TRANSGLUTAMINASE, IGA: Tissue Transglutaminase Ab, IgA: <2 U/mL (ref 0–3)

## 2017-12-19 LAB — CALPROTECTIN, FECAL: Calprotectin, Fecal: <16 ug/g (ref 0–120)

## 2017-12-24 ENCOUNTER — Encounter: Payer: Self-pay | Admitting: Gastroenterology

## 2017-12-24 ENCOUNTER — Ambulatory Visit
Admission: RE | Admit: 2017-12-24 | Discharge: 2017-12-24 | Disposition: A | Discharge status: Home / Self Care | Payer: Medicare Other | Source: Ambulatory Visit | Attending: Gastroenterology | Admitting: Gastroenterology

## 2017-12-24 DIAGNOSIS — K760 Fatty (change of) liver, not elsewhere classified: Secondary | ICD-10-CM | POA: Diagnosis not present

## 2017-12-24 DIAGNOSIS — R945 Abnormal results of liver function studies: Secondary | ICD-10-CM | POA: Insufficient documentation

## 2017-12-24 DIAGNOSIS — R7989 Other specified abnormal findings of blood chemistry: Secondary | ICD-10-CM

## 2017-12-31 ENCOUNTER — Encounter: Payer: Self-pay | Admitting: Gastroenterology

## 2017-12-31 ENCOUNTER — Ambulatory Visit (INDEPENDENT_AMBULATORY_CARE_PROVIDER_SITE_OTHER): Payer: Medicare Other | Admitting: Gastroenterology

## 2017-12-31 VITALS — BP 118/71 | HR 73 | Resp 17 | Ht 62.5 in | Wt 175.6 lb

## 2017-12-31 DIAGNOSIS — R1084 Generalized abdominal pain: Secondary | ICD-10-CM | POA: Diagnosis not present

## 2017-12-31 DIAGNOSIS — R197 Diarrhea, unspecified: Secondary | ICD-10-CM | POA: Diagnosis not present

## 2017-12-31 DIAGNOSIS — K7400 Hepatic fibrosis, unspecified: Secondary | ICD-10-CM

## 2017-12-31 DIAGNOSIS — K760 Fatty (change of) liver, not elsewhere classified: Secondary | ICD-10-CM

## 2017-12-31 DIAGNOSIS — K580 Irritable bowel syndrome with diarrhea: Secondary | ICD-10-CM

## 2017-12-31 DIAGNOSIS — R140 Abdominal distension (gaseous): Secondary | ICD-10-CM

## 2017-12-31 NOTE — Patient Instructions (Addendum)
Diet for Irritable Bowel Syndrome When you have irritable bowel syndrome (IBS), the foods you eat and your eating habits are very important. IBS may cause various symptoms, such as abdominal pain, constipation, or diarrhea. Choosing the right foods can help ease discomfort caused by these symptoms. Work with your health care provider and dietitian to find the best eating plan to help control your symptoms. What general guidelines do I need to follow?  Keep a food diary. This will help you identify foods that cause symptoms. Write down: ? What you eat and when. ? What symptoms you have. ? When symptoms occur in relation to your meals.  Avoid foods that cause symptoms. Talk with your dietitian about other ways to get the same nutrients that are in these foods.  Eat more foods that contain fiber. Take a fiber supplement if directed by your dietitian.  Eat your meals slowly, in a relaxed setting.  Aim to eat 5-6 small meals per day. Do not skip meals.  Drink enough fluids to keep your urine clear or pale yellow.  Ask your health care provider if you should take an over-the-counter probiotic during flare-ups to help restore healthy gut bacteria.  If you have cramping or diarrhea, try making your meals low in fat and high in carbohydrates. Examples of carbohydrates are pasta, rice, whole grain breads and cereals, fruits, and vegetables.  If dairy products cause your symptoms to flare up, try eating less of them. You might be able to handle yogurt better than other dairy products because it contains bacteria that help with digestion. What foods are not recommended? The following are some foods and drinks that may worsen your symptoms:  Fatty foods, such as [place] fries.  Milk products, such as cheese or ice cream.  Chocolate.  Alcohol.  Products with caffeine, such as coffee.  Carbonated drinks, such as soda.  The items listed above may not be a complete list of foods and beverages to  avoid. Contact your dietitian for more information. What foods are good sources of fiber? Your health care provider or dietitian may recommend that you eat more foods that contain fiber. Fiber can help reduce constipation and other IBS symptoms. Add foods with fiber to your diet a little at a time so that your body can get used to them. Too much fiber at once might cause gas and swelling of your abdomen. The following are some foods that are good sources of fiber:  Apples.  Peaches.  Pears.  Berries.  Figs.  Broccoli (raw).  Cabbage.  Carrots.  Raw peas.  Kidney beans.  Lima beans.  Whole grain bread.  Whole grain cereal.  Where to find more information: [employer] for Functional Gastrointestinal Disorders: www.iffgd.[employer] of Diabetes and Digestive and Kidney Diseases: NetworkAffair.co.za.aspx This information is not intended to replace advice given to you by your health care provider. Make sure you discuss any questions you have with your health care provider. Document Released: 09/14/2003 Document Revised: 11/30/2015 Document Reviewed: 09/24/2013 Elsevier Interactive Patient Education  2018 [employer]. Fat and Cholesterol Restricted Diet High levels of fat and cholesterol in your blood may lead to various health problems, such as diseases of the heart, blood vessels, gallbladder, liver, and pancreas. Fats are concentrated sources of energy that come in various forms. Certain types of fat, including saturated fat, may be harmful in excess. Cholesterol is a substance needed by your body in small amounts. Your body makes all the cholesterol it needs. Excess  cholesterol comes from the food you eat. When you have high levels of cholesterol and saturated fat in your blood, health problems can develop because the excess fat and cholesterol will gather along the walls of your blood  vessels, causing them to narrow. Choosing the right foods will help you control your intake of fat and cholesterol. This will help keep the levels of these substances in your blood within normal limits and reduce your risk of disease. What is my plan? Your health care provider recommends that you:  Limit your fat intake to ______% or less of your total calories per day.  Limit the amount of cholesterol in your diet to less than _________mg per day.  Eat 20-30 grams of fiber each day.  What types of fat should I choose?  Choose healthy fats more often. Choose monounsaturated and polyunsaturated fats, such as olive and canola oil, flaxseeds, walnuts, almonds, and seeds.  Eat more omega-3 fats. Good choices include salmon, mackerel, sardines, tuna, flaxseed oil, and ground flaxseeds. Aim to eat fish at least two times a week.  Limit saturated fats. Saturated fats are primarily found in animal products, such as meats, butter, and cream. Plant sources of saturated fats include palm oil, palm kernel oil, and coconut oil.  Avoid foods with partially hydrogenated oils in them. These contain trans fats. Examples of foods that contain trans fats are stick margarine, some tub margarines, cookies, crackers, and other baked goods. What general guidelines do I need to follow? These guidelines for healthy eating will help you control your intake of fat and cholesterol:  Check food labels carefully to identify foods with trans fats or high amounts of saturated fat.  Fill one half of your plate with vegetables and green salads.  Fill one fourth of your plate with whole grains. Look for the word "whole" as the first word in the ingredient list.  Fill one fourth of your plate with lean protein foods.  Limit fruit to two servings a day. Choose fruit instead of juice.  Eat more foods that contain fiber, such as apples, broccoli, carrots, beans, peas, and barley.  Eat more home-cooked food and less  restaurant, buffet, and fast food.  Limit or avoid alcohol.  Limit foods high in starch and sugar.  Limit fried foods.  Cook foods using methods other than frying. Baking, boiling, grilling, and broiling are all great options.  Lose weight if you are overweight. Losing just 5-10% of your initial body weight can help your overall health and prevent diseases such as diabetes and heart disease.  What foods can I eat? Grains  Whole grains, such as whole wheat or whole grain breads, crackers, cereals, and pasta. Unsweetened oatmeal, bulgur, barley, quinoa, or brown rice. Corn or whole wheat flour tortillas. Vegetables  Fresh or frozen vegetables (raw, steamed, roasted, or grilled). Green salads. Fruits  All fresh, canned (in natural juice), or frozen fruits. Meats and other protein foods  Ground beef (85% or leaner), grass-fed beef, or beef trimmed of fat. Skinless chicken or [place]. Ground chicken or [place]. Pork trimmed of fat. All fish and seafood. Eggs. Dried beans, peas, or lentils. Unsalted nuts or seeds. Unsalted canned or dry beans. Dairy  Low-fat dairy products, such as skim or 1% milk, 2% or reduced-fat cheeses, low-fat ricotta or cottage cheese, or plain low-fat yo Fats and oils  Tub margarines without trans fats. Light or reduced-fat mayonnaise and salad dressings. Avocado. Olive, canola, sesame, or safflower oils. Natural peanut or almond  butter (choose ones without added sugar and oil). The items listed above may not be a complete list of recommended foods or beverages. Contact your dietitian for more options. Foods to avoid Grains  White bread. White pasta. White rice. Cornbread. Bagels, pastries, and croissants. Crackers that contain trans fat. Vegetables  White potatoes. Corn. Creamed or fried vegetables. Vegetables in a cheese sauce. Fruits  Dried fruits. Canned fruit in light or heavy syrup. Fruit juice. Meats and other protein foods  Fatty cuts of meat.  Ribs, chicken wings, bacon, sausage, bologna, salami, chitterlings, fatback, hot dogs, bratwurst, and packaged luncheon meats. Liver and organ meats. Dairy  Whole or 2% milk, cream, half-and-half, and cream cheese. Whole milk cheeses. Whole-fat or sweetened yogurt. Full-fat cheeses. Nondairy creamers and whipped toppings. Processed cheese, cheese spreads, or cheese curds. Beverages  Alcohol. Sweetened drinks (such as sodas, lemonade, and fruit drinks or punches). Fats and oils  Butter, stick margarine, lard, shortening, ghee, or bacon fat. Coconut, palm kernel, or palm oils. Sweets and desserts  Corn syrup, sugars, honey, and molasses. Candy. Jam and jelly. Syrup. Sweetened cereals. Cookies, pies, cakes, donuts, muffins, and ice cream. The items listed above may not be a complete list of foods and beverages to avoid. Contact your dietitian for more information.  This information is not intended to replace advice given to you by your health care provider. Make sure you discuss any questions you have with your health care provider. Document Released: 06/24/2005 Document Revised: 07/15/2014 Document Reviewed: 09/22/2013 Elsevier Interactive Patient Education  [employer].

## 2017-12-31 NOTE — Progress Notes (Signed)
Cephas Darby, MD [address]  [place]  [place], [place] [address]  Main: [phone]  Fax: [phone]    Gastroenterology Consultation  Referring Provider:     [name], NP Primary Care Physician:  [name], NP Primary Gastroenterologist:  Dr. Cephas Darby Reason for Consultation:     Increased bowel frequency        HPI:   Amy Wilcox is a 67 y.o. Caucasian female with metabolic syndrome referred by Dr. Carlis Abbott, Leticia Penna, NP  for consultation & management of chronic diarrhea. She describes sporadic episodes of non bloody diarrhea, approximately 5/day a/w abdominal cramps. Stools are of variable consistency. Certain foods like raw onions, cream cheese, spicy foods, raw fruits and vegetables, a/w bloating. These occur approximately upto 3 times/month. Also, has constant upper abdominal discomfort, and epigastric burning, on pepcid as needed. Takes imodium daily at bedtime. The symptoms have been chronic and occurring for the past 10 years. She underwent colonoscopy in [place] in 2009 and was normal. She reports that her symptoms got worse and her last colonoscopy and would prefer not to undergo another colonoscopy. Random biopsies was not performed at that time. She does not recall if she was tested for celiac disease or H. pylori infection. She did not have EGD in the past. She is not losing weight. She denies nausea or vomiting, use of antibiotics, sick contacts or recent travel. She is retired and moved along with her husband from [place] about 10 years ago. She has elderly parents and has to travel frequently to [place] which she finds it quite stressful.  She reports that she is trying to eat healthy food.  She is also found to have mildly elevated transaminases since 02/2017. She also has hyperlipidemia and unable to tolerate statins. He is currently on ezetimibe. HCV antibody negative. She denies alcohol use or IV drug abuse  Follow-up visit  12/31/2017 Since last visit, patient underwent secondary liver disease workup which came back negative except for fatty liver and ultrasound elastography revealed moderate degree of fibrosis. She feels that her IBS symptoms have responded to VSL #3 but could not find it over-the-counter. her insurance did not approve amitriptyline. I switched to Zoloft and she took only one dose which caused headache. She is very anxious about her ultrasound results of the liver.she is here today to discuss about ultrasound results. Her husband is with her today. She wants to know if fatty liver and IBS are connected She is taking artificial sweeteners  NSAIDs: None  Antiplts/Anticoagulants/Anti thrombotics: None  GI Procedures: Colonoscopy in 2009 in [place], normal, biopsies were not performed She denies family history of GI malignancy, inflammatory bowel disease or celiac disease  Past Medical History:  Diagnosis Date  . Essential hypertension   . Fibrocystic breast   . Hyperlipidemia   . IBS (irritable bowel syndrome)   . Idiopathic hematuria   . OSA (obstructive sleep apnea)     Past Surgical History:  Procedure Laterality Date  . TONSILLECTOMY AND ADENOIDECTOMY  1961    Current Outpatient Medications:  .  aspirin EC 81 MG tablet, Take 81 mg by mouth daily., Disp: , Rfl:  .  CALCIUM PO, Take by mouth., Disp: , Rfl:  .  Cholecalciferol (VITAMIN D) 2000 units CAPS, Take by mouth., Disp: , Rfl:  .  dicyclomine (BENTYL) 10 MG capsule, Take 1 capsule by mouth three times daily prior to meals as needed for IBS., Disp: 30  capsule, Rfl: 0 .  ezetimibe (ZETIA) 10 MG tablet, TAKE 1 TABLET BY MOUTH EVERY DAY, Disp: 30 tablet, Rfl: 5 .  famotidine (PEPCID) 20 MG tablet, Take 20 mg by mouth daily as needed for heartburn or indigestion., Disp: , Rfl:  .  loperamide (IMODIUM) 2 MG capsule, Take 1 mg by mouth at bedtime. , Disp: , Rfl:  .  metoprolol succinate (TOPROL-XL) 50 MG 24 hr tablet, TAKE 1 TABLET (50  MG TOTAL) BY MOUTH DAILY. TAKE WITH OR IMMEDIATELY FOLLOWING A MEAL., Disp: 30 tablet, Rfl: 5 .  Omega-3 Fatty Acids (FISH OIL PO), Take 1,200 mg by mouth 2 (two) times daily., Disp: , Rfl:  .  valsartan-hydrochlorothiazide (DIOVAN-HCT) 80-12.5 MG tablet, TAKE 1 TABLET BY MOUTH EVERY DAY, Disp: 90 tablet, Rfl: 1 .  cyanocobalamin 500 MCG tablet, Take 500 mcg by mouth daily., Disp: , Rfl:  .  sertraline (ZOLOFT) 50 MG tablet, Take 1 tablet (50 mg total) by mouth at bedtime. (Patient not taking: Reported on 12/31/2017), Disp: 30 tablet, Rfl: 0    Family History  Problem Relation Age of Onset  . Hypertension Mother   . Hyperlipidemia Mother   . Alzheimer's disease Mother   . Heart disease Father   . Heart attack Father   . Hyperlipidemia Father   . Hypertension Father      Social History   Tobacco Use  . Smoking status: Never Smoker  . Smokeless tobacco: Never Used  Substance Use Topics  . Alcohol use: No    Frequency: Never  . Drug use: No    Allergies as of 12/31/2017 - Review Complete 12/31/2017  Allergen Reaction Noted  . Livalo [pitavastatin]  02/20/2017  . Statins  02/20/2017    Review of Systems:    All systems reviewed and negative except where noted in HPI.   Physical Exam:  BP 118/71 (BP Location: Left Arm, Patient Position: Sitting, Cuff Size: Large)   Pulse 73   Resp 17   Ht 5' 2.5" (1.588 m)   Wt 175 lb 9.6 oz (79.7 kg)   BMI 31.61 kg/m  No LMP recorded. Patient is postmenopausal.  General:   Alert,  Well-developed, well-nourished, pleasant and cooperative in NAD Head:  Normocephalic and atraumatic. Eyes:  Sclera clear, no icterus.   Conjunctiva pink. Ears:  Normal auditory acuity. Nose:  No deformity, discharge, or lesions. Mouth:  No deformity or lesions,oropharynx pink & moist. Neck:  Supple; no masses or thyromegaly. Lungs:  Respirations even and unlabored.  Clear throughout to auscultation.   No wheezes, crackles, or rhonchi. No acute  distress. Heart:  Regular rate and rhythm; no murmurs, clicks, rubs, or gallops. Abdomen:  Normal bowel sounds. Soft, non-tender and non-distended without masses, hepatosplenomegaly or hernias noted.  No guarding or rebound tenderness.   Rectal: Not performed Msk:  Symmetrical without gross deformities. Good, equal movement & strength bilaterally. Pulses:  Normal pulses noted. Extremities:  No clubbing or edema.  No cyanosis. Neurologic:  Alert and oriented x3;  grossly normal neurologically. Skin:  Intact without significant lesions or rashes. No jaundice. Lymph Nodes:  No significant cervical adenopathy. Psych:  Alert and cooperative. Normal mood and affect.  Imaging Studies: No abdominal imaging available  Assessment and Plan:   Amy Wilcox is a 67 y.o.  White female with metabolic syndrome, presents with chronic history of nonbloody diarrhea with abdominal bloating, cramps and epigastric discomfort without other alarm signs or symptoms. No evidence of iron deficiency anemia. 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   GI pathogen panel, stool for C. difficile, H. pylori stool antigen testing AND back negative   check celiac serologies and CRP, fecal calprotectin came back normal - Recommend trial IB guard - Recommend to continue VSL #3, provided her with resources where she can get the probiotics - she is hesitant to undergo colonoscopy because she is worried about the bowel prep precipitating her IBS symptoms as she experiencedit in the past  Elevated LFTs: secondary to fatty liver with moderate degree of fibrosis, given history of metabolic syndrome secondary liver disease workup unremarkable Discussed in length with her about fatty liver, its progression and management Reassured her that this can be irreversible the following healthy diet and exercise Monitor LFTs every 3-6  months   Follow up in 3 months   Cephas Darby, MD

## 2018-01-12 ENCOUNTER — Other Ambulatory Visit: Payer: Self-pay | Admitting: Gastroenterology

## 2018-01-12 DIAGNOSIS — K580 Irritable bowel syndrome with diarrhea: Secondary | ICD-10-CM

## 2018-03-23 ENCOUNTER — Other Ambulatory Visit: Payer: Self-pay | Admitting: Primary Care

## 2018-03-23 DIAGNOSIS — R7303 Prediabetes: Secondary | ICD-10-CM

## 2018-03-23 DIAGNOSIS — E785 Hyperlipidemia, unspecified: Secondary | ICD-10-CM

## 2018-04-01 ENCOUNTER — Other Ambulatory Visit (INDEPENDENT_AMBULATORY_CARE_PROVIDER_SITE_OTHER): Payer: Medicare Other

## 2018-04-01 DIAGNOSIS — E785 Hyperlipidemia, unspecified: Secondary | ICD-10-CM | POA: Diagnosis not present

## 2018-04-01 DIAGNOSIS — R7303 Prediabetes: Secondary | ICD-10-CM

## 2018-04-01 LAB — HEPATIC FUNCTION PANEL
ALT: 43 U/L — ABNORMAL HIGH (ref 0–35)
AST: 25 U/L (ref 0–37)
Albumin: 4.6 g/dL (ref 3.5–5.2)
Alkaline Phosphatase: 32 U/L — ABNORMAL LOW (ref 39–117)
Bilirubin, Direct: 0.1 mg/dL (ref 0.0–0.3)
Total Bilirubin: 0.8 mg/dL (ref 0.2–1.2)
Total Protein: 7.4 g/dL (ref 6.0–8.3)

## 2018-04-01 LAB — LIPID PANEL
Cholesterol: 215 mg/dL — ABNORMAL HIGH (ref 0–200)
HDL: 38.8 mg/dL — AB (ref >39.00)
LDL CALC: 138 mg/dL — AB (ref 0–99)
NonHDL: 176.26
TRIGLYCERIDES: 192 mg/dL — AB (ref 0.0–149.0)
Total CHOL/HDL Ratio: 6
VLDL: 38.4 mg/dL (ref 0.0–40.0)

## 2018-04-01 LAB — HEMOGLOBIN A1C: Hgb A1c MFr Bld: 5.9 % (ref 4.6–6.5)

## 2018-04-03 ENCOUNTER — Ambulatory Visit (INDEPENDENT_AMBULATORY_CARE_PROVIDER_SITE_OTHER): Payer: Medicare Other | Admitting: Gastroenterology

## 2018-04-03 ENCOUNTER — Other Ambulatory Visit: Payer: Self-pay

## 2018-04-03 ENCOUNTER — Encounter: Payer: Self-pay | Admitting: Gastroenterology

## 2018-04-03 VITALS — BP 132/73 | HR 67 | Resp 16 | Ht 62.5 in | Wt 176.2 lb

## 2018-04-03 DIAGNOSIS — K760 Fatty (change of) liver, not elsewhere classified: Secondary | ICD-10-CM

## 2018-04-03 DIAGNOSIS — K7400 Hepatic fibrosis, unspecified: Secondary | ICD-10-CM

## 2018-04-03 DIAGNOSIS — K580 Irritable bowel syndrome with diarrhea: Secondary | ICD-10-CM

## 2018-04-03 NOTE — Progress Notes (Signed)
Cephas Darby, MD [address]  [place]  [place], [place] [address]  Main: [phone]  Fax: [phone]    Gastroenterology Consultation  Referring Provider:     [name], NP Primary Care Physician:  [name], NP Primary Gastroenterologist:  Dr. Cephas Darby Reason for Consultation: IBS, NASH-fibrosis        HPI:   Amy Wilcox is a 67 y.o. Caucasian female with metabolic syndrome referred by Dr. Carlis Abbott, Leticia Penna, NP  for consultation & management of chronic diarrhea. She describes sporadic episodes of non bloody diarrhea, approximately 5/day a/w abdominal cramps. Stools are of variable consistency. Certain foods like raw onions, cream cheese, spicy foods, raw fruits and vegetables, a/w bloating. These occur approximately upto 3 times/month. Also, has constant upper abdominal discomfort, and epigastric burning, on pepcid as needed. Takes imodium daily at bedtime. The symptoms have been chronic and occurring for the past 10 years. She underwent colonoscopy in [place] in 2009 and was normal. She reports that her symptoms got worse and her last colonoscopy and would prefer not to undergo another colonoscopy. Random biopsies was not performed at that time. She does not recall if she was tested for celiac disease or H. pylori infection. She did not have EGD in the past. She is not losing weight. She denies nausea or vomiting, use of antibiotics, sick contacts or recent travel. She is retired and moved along with her husband from [place] about 10 years ago. She has elderly parents and has to travel frequently to [place] which she finds it quite stressful.  She reports that she is trying to eat healthy food.  She is also found to have mildly elevated transaminases since 02/2017. She also has hyperlipidemia and unable to tolerate statins. He is currently on ezetimibe. HCV antibody negative. She denies alcohol use or IV drug abuse  Follow-up visit 12/31/2017 Since  last visit, patient underwent secondary liver disease workup which came back negative except for fatty liver and ultrasound elastography revealed moderate degree of fibrosis. She feels that her IBS symptoms have responded to VSL #3 but could not find it over-the-counter. her insurance did not approve amitriptyline. I switched to Zoloft and she took only one dose which caused headache. She is very anxious about her ultrasound results of the liver.she is here today to discuss about ultrasound results. Her husband is with her today. She wants to know if fatty liver and IBS are connected She is taking artificial sweeteners  Follow-up visit 04/03/2018 She reports that her IBS symptoms are manageable. She tried amitriptyline 50 mg at bedtime which resulted in drowsiness all day next day.  She also tried IBgard which did not provide any symptom relief.  She ordered VSL#3 online, she reports that she is watching what she is eating. She did not lose any weight since last visit.  She denies any other complaints, otherwise   NSAIDs: None  Antiplts/Anticoagulants/Anti thrombotics: None  GI Procedures: Colonoscopy in 2009 in [place], normal, biopsies were not performed She denies family history of GI malignancy, inflammatory bowel disease or celiac disease  Past Medical History:  Diagnosis Date  . Essential hypertension   . Fibrocystic breast   . Hyperlipidemia   . IBS (irritable bowel syndrome)   . Idiopathic hematuria   . OSA (obstructive sleep apnea)     Past Surgical History:  Procedure Laterality Date  . TONSILLECTOMY AND ADENOIDECTOMY  1961    Current Outpatient Medications:  .  aspirin EC 81 MG tablet, Take 81 mg by mouth daily., Disp: , Rfl:  .  CALCIUM PO, Take by mouth., Disp: , Rfl:  .  Cholecalciferol (VITAMIN D) 2000 units CAPS, Take by mouth., Disp: , Rfl:  .  cyanocobalamin 500 MCG tablet, Take 500 mcg by mouth daily., Disp: , Rfl:  .  dicyclomine (BENTYL) 10 MG capsule, Take 1  capsule by mouth three times daily prior to meals as needed for IBS., Disp: 30 capsule, Rfl: 0 .  ezetimibe (ZETIA) 10 MG tablet, TAKE 1 TABLET BY MOUTH EVERY DAY, Disp: 30 tablet, Rfl: 5 .  famotidine (PEPCID) 20 MG tablet, Take 20 mg by mouth daily as needed for heartburn or indigestion., Disp: , Rfl:  .  loperamide (IMODIUM) 2 MG capsule, Take 1 mg by mouth at bedtime. , Disp: , Rfl:  .  metoprolol succinate (TOPROL-XL) 50 MG 24 hr tablet, TAKE 1 TABLET (50 MG TOTAL) BY MOUTH DAILY. TAKE WITH OR IMMEDIATELY FOLLOWING A MEAL., Disp: 30 tablet, Rfl: 5 .  Omega-3 Fatty Acids (FISH OIL PO), Take 1,200 mg by mouth 2 (two) times daily., Disp: , Rfl:  .  valsartan-hydrochlorothiazide (DIOVAN-HCT) 80-12.5 MG tablet, TAKE 1 TABLET BY MOUTH EVERY DAY, Disp: 90 tablet, Rfl: 1 .  amitriptyline (ELAVIL) 50 MG tablet, TAKE 1 TABLET BY MOUTH EVERYDAY AT BEDTIME, Disp: , Rfl: 1    Family History  Problem Relation Age of Onset  . Hypertension Mother   . Hyperlipidemia Mother   . Alzheimer's disease Mother   . Heart disease Father   . Heart attack Father   . Hyperlipidemia Father   . Hypertension Father      Social History   Tobacco Use  . Smoking status: Never Smoker  . Smokeless tobacco: Never Used  Substance Use Topics  . Alcohol use: No    Frequency: Never  . Drug use: No    Allergies as of 04/03/2018 - Review Complete 04/03/2018  Allergen Reaction Noted  . Livalo [pitavastatin]  02/20/2017  . Statins  02/20/2017    Review of Systems:    All systems reviewed and negative except where noted in HPI.   Physical Exam:  BP 132/73 (BP Location: Left Arm, Patient Position: Sitting, Cuff Size: Large)   Pulse 67   Resp 16   Ht 5' 2.5" (1.588 m)   Wt 176 lb 3.2 oz (79.9 kg)   BMI 31.71 kg/m  No LMP recorded. Patient is postmenopausal.  General:   Alert,  Well-developed, well-nourished, pleasant and cooperative in NAD Head:  Normocephalic and atraumatic. Eyes:  Sclera clear, no  icterus.   Conjunctiva pink. Ears:  Normal auditory acuity. Nose:  No deformity, discharge, or lesions. Mouth:  No deformity or lesions,oropharynx pink & moist. Neck:  Supple; no masses or thyromegaly. Lungs:  Respirations even and unlabored.  Clear throughout to auscultation.   No wheezes, crackles, or rhonchi. No acute distress. Heart:  Regular rate and rhythm; no murmurs, clicks, rubs, or gallops. Abdomen:  Normal bowel sounds. Soft, non-tender and mildly distended, tympanic without masses, hepatosplenomegaly or hernias noted.  No guarding or rebound tenderness.   Rectal: Not performed Msk:  Symmetrical without gross deformities. Good, equal movement & strength bilaterally. Pulses:  Normal pulses noted. Extremities:  No clubbing or edema.  No cyanosis. Neurologic:  Alert and oriented x3;  grossly normal neurologically. Skin:  Intact without significant lesions or rashes. No jaundice. Lymph Nodes:  No significant cervical adenopathy. Psych:  Alert and cooperative.  Normal mood and affect.  Imaging Studies: No abdominal imaging available  Assessment and Plan:   Amy Wilcox is a 67 y.o.  White female with metabolic syndrome, Nash fibrosis, diarrhea predominant IBS here for follow-up.  Diarrhea predominant IBS.   GI pathogen panel, stool for C. difficile, H. pylori stool antigen test negative   celiac serologies and CRP, fecal calprotectin came back normal -Discussed with her to try low-dose amitriptyline, 25 mg at bedtime -VSL #3  Colon cancer screening: Last colonoscopy in 2009, unremarkable Cologuard in 2018 negative She inquired about different bowel prep options that are available currently.  I discussed in detail with her.  She said she will think about it and contact my office to schedule screening colonoscopy   Elevated LFTs: secondary to fatty liver with moderate degree of fibrosis based on ultrasound elastography. secondary liver disease workup unremarkable Her LFTs are  mildly elevated, stable Discussed previously in length with her about fatty liver, its progression and management Reassured her that this can be irreversible by following healthy diet and exercise and weight loss Monitor LFTs every 6 months   Follow up in 6 months   Cephas Darby, MD

## 2018-04-08 DIAGNOSIS — Z23 Encounter for immunization: Secondary | ICD-10-CM | POA: Diagnosis not present

## 2018-04-15 ENCOUNTER — Other Ambulatory Visit: Payer: Self-pay | Admitting: Primary Care

## 2018-04-15 NOTE — Telephone Encounter (Signed)
Amy Wilcox, can you get her a copy of the CPAP compliance report under the Media Tab in Epic? It's dated 2018. See my chart message. Thanks.

## 2018-04-17 NOTE — Telephone Encounter (Signed)
Spoken to patient and left the report in the front office for pick up

## 2018-05-01 ENCOUNTER — Other Ambulatory Visit: Payer: Self-pay | Admitting: Primary Care

## 2018-05-11 ENCOUNTER — Ambulatory Visit (INDEPENDENT_AMBULATORY_CARE_PROVIDER_SITE_OTHER): Payer: Medicare Other | Admitting: Primary Care

## 2018-05-11 ENCOUNTER — Encounter: Payer: Self-pay | Admitting: Primary Care

## 2018-05-11 VITALS — BP 122/76 | HR 66 | Temp 98.2°F | Ht 62.5 in | Wt 176.2 lb

## 2018-05-11 DIAGNOSIS — I10 Essential (primary) hypertension: Secondary | ICD-10-CM | POA: Diagnosis not present

## 2018-05-11 DIAGNOSIS — R42 Dizziness and giddiness: Secondary | ICD-10-CM | POA: Insufficient documentation

## 2018-05-11 LAB — BASIC METABOLIC PANEL
BUN: 22 mg/dL (ref 6–23)
CALCIUM: 10.4 mg/dL (ref 8.4–10.5)
CO2: 28 mEq/L (ref 19–32)
CREATININE: 0.85 mg/dL (ref 0.40–1.20)
Chloride: 103 mEq/L (ref 96–112)
GFR: 70.97 mL/min (ref >60.00)
GLUCOSE: 139 mg/dL — AB (ref 70–99)
Potassium: 3.9 mEq/L (ref 3.5–5.1)
Sodium: 140 mEq/L (ref 135–145)

## 2018-05-11 LAB — CBC
HCT: 44.2 % (ref 36.0–46.0)
HEMOGLOBIN: 15.5 g/dL — AB (ref 12.0–15.0)
MCHC: 35 g/dL (ref 30.0–36.0)
MCV: 88.1 fl (ref 78.0–100.0)
Platelets: 205 10*3/uL (ref 150.0–400.0)
RBC: 5.01 Mil/uL (ref 3.87–5.11)
RDW: 12.7 % (ref 11.5–15.5)
WBC: 6.1 10*3/uL (ref 4.0–10.5)

## 2018-05-11 LAB — TSH: TSH: 0.95 u[IU]/mL (ref 0.35–4.50)

## 2018-05-11 NOTE — Assessment & Plan Note (Addendum)
Consistent for 3-4 days, now nearly resolved. Consider stress vs dehydration as cause. No CVA symptoms. Check CBC, TSH, BMP today.   Discussed importance of stress reduction, proper hydration with water. Discussed use of meclizine if needed. She will update.

## 2018-05-11 NOTE — Patient Instructions (Signed)
Stop by the lab prior to leaving today. I will notify you of your results once received.   Ensure you are consuming 64 ounces of water daily.  You can try meclizine as needed for dizziness. This can be purchased over the counter.  It was a pleasure to see you today!

## 2018-05-11 NOTE — Assessment & Plan Note (Signed)
Office readings at goal, home readings above goal. Question accuracy of BP cuff at home. Continue current regimen.

## 2018-05-11 NOTE — Progress Notes (Signed)
Subjective:    Patient ID: Amy Wilcox, female    DOB: 1950/11/26, 67 y.o.   MRN: [id number]  HPI  Ms. Behlke is a 67 year old female with a history of hypertension who presents today with a chief complaint of dizziness.  She sent a message via My Chart on 05/04/18 with reports of dizziness and feeling unsteady on her feet. She was encouraged to schedule an appointment if symptoms didn't improve.  Today she's feeling better. Her dizziness was consistent for 3-4 days total. She felt off balance each time she moved quickly, also some sensation of the room spinning. After she sent her message she worked on increasing water intake and monitored her BP. Gradually her symptoms improved and have nearly resolved. She does admit that she doesn't drink many liquids at all during the day. She has been under a lot of stress as she's helping to care for her mother who has Alzheimer's.   He home BP readings are ranging 130-160/80's. She denies chest pain, numbness/tingling, unilateral weakness, changes in speech.   BP Readings from Last 3 Encounters:  05/11/18 122/76  04/03/18 132/73  12/31/17 118/71     Review of Systems  Eyes: Negative for visual disturbance.  Respiratory: Negative for shortness of breath.   Cardiovascular: Negative for chest pain.  Neurological: Positive for dizziness.       Past Medical History:  Diagnosis Date  . Essential hypertension   . Fibrocystic breast   . Hyperlipidemia   . IBS (irritable bowel syndrome)   . Idiopathic hematuria   . OSA (obstructive sleep apnea)      Social History   Socioeconomic History  . Marital status: Married    Spouse name: Not on file  . Number of children: Not on file  . Years of education: Not on file  . Highest education level: Not on file  Occupational History  . Not on file  Social Needs  . Financial resource strain: Not on file  . Food insecurity:    Worry: Not on file    Inability: Not on file  . Transportation needs:      Medical: Not on file    Non-medical: Not on file  Tobacco Use  . Smoking status: Never Smoker  . Smokeless tobacco: Never Used  Substance and Sexual Activity  . Alcohol use: No    Frequency: Never  . Drug use: No  . Sexual activity: Yes  Lifestyle  . Physical activity:    Days per week: Not on file    Minutes per session: Not on file  . Stress: Not on file  Relationships  . Social connections:    Talks on phone: Not on file    Gets together: Not on file    Attends religious service: Not on file    Active member of club or organization: Not on file    Attends meetings of clubs or organizations: Not on file    Relationship status: Not on file  . Intimate partner violence:    Fear of current or ex partner: Not on file    Emotionally abused: Not on file    Physically abused: Not on file    Forced sexual activity: Not on file  Other Topics Concern  . Not on file  Social History Narrative   Married.   3 children.   Retired. Once worked a Agricultural engineer.   Enjoys spending time with family.    Past Surgical History:  Procedure Laterality Date  .  TONSILLECTOMY AND ADENOIDECTOMY  1961    Family History  Problem Relation Age of Onset  . Hypertension Mother   . Hyperlipidemia Mother   . Alzheimer's disease Mother   . Heart disease Father   . Heart attack Father   . Hyperlipidemia Father   . Hypertension Father     Allergies  Allergen Reactions  . Livalo [Pitavastatin]   . Statins     Current Outpatient Medications on File Prior to Visit  Medication Sig Dispense Refill  . aspirin EC 81 MG tablet Take 81 mg by mouth daily.    Marland Kitchen CALCIUM PO Take by mouth.    . Cholecalciferol (VITAMIN D) 2000 units CAPS Take by mouth.    . cyanocobalamin 500 MCG tablet Take 500 mcg by mouth daily.    Marland Kitchen ezetimibe (ZETIA) 10 MG tablet TAKE 1 TABLET BY MOUTH EVERY DAY 90 tablet 1  . famotidine (PEPCID) 20 MG tablet Take 20 mg by mouth daily as needed for heartburn or indigestion.    Marland Kitchen  loperamide (IMODIUM) 2 MG capsule Take 1 mg by mouth at bedtime.     . metoprolol succinate (TOPROL-XL) 50 MG 24 hr tablet TAKE 1 TABLET (50 MG TOTAL) BY MOUTH DAILY. TAKE WITH OR IMMEDIATELY FOLLOWING A MEAL. 90 tablet 1  . Omega-3 Fatty Acids (FISH OIL PO) Take 1,200 mg by mouth 2 (two) times daily.    . valsartan-hydrochlorothiazide (DIOVAN-HCT) 80-12.5 MG tablet TAKE 1 TABLET BY MOUTH EVERY DAY 30 tablet 5   No current facility-administered medications on file prior to visit.     BP 122/76   Pulse 66   Temp 98.2 F (36.8 C) (Oral)   Ht 5' 2.5" (1.588 m)   Wt 176 lb 4 oz (79.9 kg)   SpO2 98%   BMI 31.72 kg/m    Objective:   Physical Exam  Constitutional: She appears well-nourished.  HENT:  Right Ear: Tympanic membrane and ear canal normal.  Left Ear: Tympanic membrane and ear canal normal.  Eyes: EOM are normal.  Neck: Neck supple.  Cardiovascular: Normal rate and regular rhythm.  Respiratory: Effort normal and breath sounds normal.  Neurological: No cranial nerve deficit.  Skin: Skin is warm and dry.  Psychiatric: She has a normal mood and affect.           Assessment & Plan:

## 2018-06-10 DIAGNOSIS — Z9989 Dependence on other enabling machines and devices: Secondary | ICD-10-CM | POA: Diagnosis not present

## 2018-06-10 DIAGNOSIS — G4733 Obstructive sleep apnea (adult) (pediatric): Secondary | ICD-10-CM | POA: Diagnosis not present

## 2018-09-09 DIAGNOSIS — G4733 Obstructive sleep apnea (adult) (pediatric): Secondary | ICD-10-CM | POA: Diagnosis not present

## 2018-09-24 ENCOUNTER — Other Ambulatory Visit: Payer: Self-pay | Admitting: Primary Care

## 2018-09-24 DIAGNOSIS — R7303 Prediabetes: Secondary | ICD-10-CM

## 2018-09-24 DIAGNOSIS — I10 Essential (primary) hypertension: Secondary | ICD-10-CM

## 2018-09-24 DIAGNOSIS — E785 Hyperlipidemia, unspecified: Secondary | ICD-10-CM

## 2018-09-30 ENCOUNTER — Other Ambulatory Visit: Payer: Medicare Other

## 2018-09-30 ENCOUNTER — Ambulatory Visit: Payer: Medicare Other

## 2018-10-02 ENCOUNTER — Encounter: Payer: Medicare Other | Admitting: Primary Care

## 2018-10-18 ENCOUNTER — Other Ambulatory Visit: Payer: Self-pay | Admitting: Primary Care

## 2018-10-20 ENCOUNTER — Other Ambulatory Visit: Payer: Self-pay | Admitting: Primary Care

## 2018-11-02 NOTE — Telephone Encounter (Signed)
Left message asking pt to call office  °

## 2018-11-02 NOTE — Telephone Encounter (Signed)
Amy Wilcox, will you please schedule this patient for a virtual visit with me?

## 2018-11-03 ENCOUNTER — Ambulatory Visit (INDEPENDENT_AMBULATORY_CARE_PROVIDER_SITE_OTHER): Payer: Medicare Other | Admitting: Primary Care

## 2018-11-03 ENCOUNTER — Encounter: Payer: Self-pay | Admitting: Primary Care

## 2018-11-03 DIAGNOSIS — F418 Other specified anxiety disorders: Secondary | ICD-10-CM | POA: Diagnosis not present

## 2018-11-03 MED ORDER — HYDROXYZINE HCL 10 MG PO TABS: 10.0000 mg | ORAL_TABLET | Freq: Two times a day (BID) | ORAL | 0 refills | Status: DC | PRN

## 2018-11-03 NOTE — Progress Notes (Signed)
Subjective:    Patient ID: Amy Wilcox, female    DOB: [birth year], 68 y.o.   MRN: [id number]  HPI  Virtual Visit via Video Note  I connected with [name] on 11/03/18 at  2:40 PM EDT by a video enabled telemedicine application and verified that I am speaking with the correct person using two identifiers.   I discussed the limitations of evaluation and management by telemedicine and the availability of in person appointments. The patient expressed understanding and agreed to proceed. She is at home, I am in the office.  History of Present Illness:  Ms. Threat is a 68 year old female with a history of hypertension, OSA, prediabetes who presents today with a chief complaint of anxiety.  She's undergoing a lot of personal stress as her mother has end stage Alzheimer's Disease and she recently tested positive for Covid-19. No one is allowed to visit her mother as the nursing home is on lock down. Her father recently broke his knee so this has been tough on her. Her family often relies on her to be uplifting and positive, and over the last several weeks she's really struggled.   She is really wanting a medication to help with acute anxiety and to help calm her down during certain stressful situations. For the most part she's able to keep herself together but there are certain circumstances where she has trouble calming herself. She denies SI/HI, history of anxiety disorder, depression. Her daughter was prescribed hydroxyzine and it works well for her daughter, she would like to try as well.    Observations/Objective:  Mood appropriate. Calm. Speaking in complete sentences. No distress.  Assessment and Plan:  Situational anxiety from combination of chronically ill mother with Covid-19, general effects of Covid-19, and now her father with a fractured knee. She doesn't meet criteria for GAD and agree that this is situational given her recent circumstances. Discussed options for treatment and she  would like to move forward with low dose PRN hydroxyzine. Rx for hydroxyzine 10 mg sent to pharmacy. Instructions provided for use. She will update. Consider low dose SSRI if needed.  Follow Up Instructions:  You may take the hydroxyzine 10 mg twice daily as needed for anxiety. Start with one tablet, but you may increase up to two tablets if needed.  Please don't hesitate to message me with any questions.  It was a pleasure to see you today! [name], NP-C    I discussed the assessment and treatment plan with the patient. The patient was provided an opportunity to ask questions and all were answered. The patient agreed with the plan and demonstrated an understanding of the instructions.   The patient was advised to call back or seek an in-person evaluation if the symptoms worsen or if the condition fails to improve as anticipated.     [name], NP    Review of Systems  Constitutional: Negative for fever.  Respiratory: Negative for shortness of breath.   Cardiovascular: Negative for chest pain.  Neurological: Negative for dizziness.  Psychiatric/Behavioral: The patient is nervous/anxious.        See HPI       Past Medical History:  Diagnosis Date  . Essential hypertension   . Fibrocystic breast   . Hyperlipidemia   . IBS (irritable bowel syndrome)   . Idiopathic hematuria   . OSA (obstructive sleep apnea)      Social History   Socioeconomic History  . Marital status: Married  Spouse name: Not on file  . Number of children: Not on file  . Years of education: Not on file  . Highest education level: Not on file  Occupational History  . Not on file  Social Needs  . Financial resource strain: Not on file  . Food insecurity:    Worry: Not on file    Inability: Not on file  . Transportation needs:    Medical: Not on file    Non-medical: Not on file  Tobacco Use  . Smoking status: Never Smoker  . Smokeless tobacco: Never Used  Substance and Sexual  Activity  . Alcohol use: No    Frequency: Never  . Drug use: No  . Sexual activity: Yes  Lifestyle  . Physical activity:    Days per week: Not on file    Minutes per session: Not on file  . Stress: Not on file  Relationships  . Social connections:    Talks on phone: Not on file    Gets together: Not on file    Attends religious service: Not on file    Active member of club or organization: Not on file    Attends meetings of clubs or organizations: Not on file    Relationship status: Not on file  . Intimate partner violence:    Fear of current or ex partner: Not on file    Emotionally abused: Not on file    Physically abused: Not on file    Forced sexual activity: Not on file  Other Topics Concern  . Not on file  Social History Narrative   Married.   3 children.   Retired. Once worked a Agricultural engineer.   Enjoys spending time with family.    Past Surgical History:  Procedure Laterality Date  . TONSILLECTOMY AND ADENOIDECTOMY  1961    Family History  Problem Relation Age of Onset  . Hypertension Mother   . Hyperlipidemia Mother   . Alzheimer's disease Mother   . Heart disease Father   . Heart attack Father   . Hyperlipidemia Father   . Hypertension Father     Allergies  Allergen Reactions  . Livalo [Pitavastatin]   . Statins     Current Outpatient Medications on File Prior to Visit  Medication Sig Dispense Refill  . aspirin EC 81 MG tablet Take 81 mg by mouth daily.    Marland Kitchen CALCIUM PO Take by mouth.    . Cholecalciferol (VITAMIN D) 2000 units CAPS Take by mouth.    . cyanocobalamin 500 MCG tablet Take 500 mcg by mouth daily.    Marland Kitchen ezetimibe (ZETIA) 10 MG tablet TAKE 1 TABLET BY MOUTH EVERY DAY 90 tablet 1  . famotidine (PEPCID) 20 MG tablet Take 20 mg by mouth daily as needed for heartburn or indigestion.    Marland Kitchen loperamide (IMODIUM) 2 MG capsule Take 1 mg by mouth at bedtime.     . metoprolol succinate (TOPROL-XL) 50 MG 24 hr tablet TAKE 1 TABLET (50 MG TOTAL) BY  MOUTH DAILY. TAKE WITH OR IMMEDIATELY FOLLOWING A MEAL. 90 tablet 1  . Omega-3 Fatty Acids (FISH OIL PO) Take 1,200 mg by mouth 2 (two) times daily.    . valsartan-hydrochlorothiazide (DIOVAN-HCT) 80-12.5 MG tablet TAKE 1 TABLET BY MOUTH EVERY DAY 90 tablet 1   No current facility-administered medications on file prior to visit.     There were no vitals taken for this visit.   Objective:   Physical Exam  Constitutional: She is oriented  to person, place, and time. She appears well-nourished.  Respiratory: Effort normal.  Neurological: She is alert and oriented to person, place, and time.  Skin: Skin is dry.  Psychiatric: She has a normal mood and affect.           Assessment & Plan:

## 2018-11-03 NOTE — Patient Instructions (Signed)
You may take the hydroxyzine 10 mg twice daily as needed for anxiety. Start with one tablet, but you may increase up to two tablets if needed.  Please don't hesitate to message me with any questions.  It was a pleasure to see you today! [name], NP-C

## 2018-11-03 NOTE — Assessment & Plan Note (Signed)
Situational anxiety from combination of chronically ill mother with Covid-19, general effects of Covid-19, and now her father with a fractured knee. She doesn't meet criteria for GAD and agree that this is situational given her recent circumstances. Discussed options for treatment and she would like to move forward with low dose PRN hydroxyzine. Rx for hydroxyzine 10 mg sent to pharmacy. Instructions provided for use. She will update. Consider low dose SSRI if needed.

## 2018-11-09 NOTE — Telephone Encounter (Signed)
[facility] Patient Name: Amy Wilcox Gender: Female DOB: 1950-09-13 Age: 68 Y 46 M 29 D Return Phone Number: [phone] (Primary), [phone] (Secondary) Address[place] [address] [place] Night - Client Client Site [place] Physician Alma Friendly - NP Contact Type Call Who Is Calling Patient / Member / Family / Caregiver Call Type Triage / Clinical Relationship To Patient Self Return Phone Number [phone] (Primary) Chief Complaint Headache Reason for Call Symptomatic / Request for Health Information Initial Comment Her mother is in a nursing home and tested positive for COVID. The caller is needing to know if they have the virus before they go to see her. She has had a headache for the past few days along with a sore throat for about a month. Translation No Nurse Assessment Nurse: Alveta Heimlich, RN, Santiago Glad Date/Time Eilene Ghazi Time): 11/06/2018 6:09:30 PM Confirm and document reason for call. If symptomatic, describe symptoms. ---Caller states that mother tested positive for covid. Caller states that she has had a headache for the last three days and a sore throat for the last month. Patient denies any fever and has not seen her mother since March 11. Mother was placed in nursing home at beginning of March. Has the patient had close contact with a person known or suspected to have the novel coronavirus illness OR traveled / lives in area with major community spread (including international travel) in the last 14 days from the onset of symptoms? * If Asymptomatic, screen for exposure and travel within the last 14 days. ---No Does the patient have any new or worsening symptoms? ---No Please document clinical information provided and list any resource used. ---Patient was told to call local health  department and physician she states that her and husband have not been in contact with anyone. Patient was also given the cdc website for further recommendations. Guidelines Guideline Title Affirmed Question Affirmed Notes Nurse Date/Time (Eastern Time) Disp. Time Eilene Ghazi Time) Disposition Final User 11/06/2018 6:16:13 PM Clinical Call Yes Alveta Heimlich, RN, Santiago Glad

## 2018-11-09 NOTE — Telephone Encounter (Signed)
Please see documented responses below from the office regarding patient's concerns.

## 2018-11-25 DIAGNOSIS — F418 Other specified anxiety disorders: Secondary | ICD-10-CM

## 2018-11-26 MED ORDER — HYDROXYZINE HCL 10 MG PO TABS: 10.0000 mg | ORAL_TABLET | Freq: Two times a day (BID) | ORAL | 0 refills | Status: DC | PRN

## 2019-01-25 ENCOUNTER — Ambulatory Visit (INDEPENDENT_AMBULATORY_CARE_PROVIDER_SITE_OTHER): Payer: Medicare Other

## 2019-01-25 ENCOUNTER — Other Ambulatory Visit (INDEPENDENT_AMBULATORY_CARE_PROVIDER_SITE_OTHER): Payer: Medicare Other

## 2019-01-25 ENCOUNTER — Ambulatory Visit: Payer: Medicare Other

## 2019-01-25 VITALS — BP 139/75 | HR 68 | Temp 98.2°F | Ht 63.0 in | Wt 172.0 lb

## 2019-01-25 DIAGNOSIS — Z0000 Encounter for general adult medical examination without abnormal findings: Secondary | ICD-10-CM | POA: Diagnosis not present

## 2019-01-25 DIAGNOSIS — E785 Hyperlipidemia, unspecified: Secondary | ICD-10-CM | POA: Diagnosis not present

## 2019-01-25 DIAGNOSIS — R7303 Prediabetes: Secondary | ICD-10-CM

## 2019-01-25 LAB — LIPID PANEL
Cholesterol: 229 mg/dL — ABNORMAL HIGH (ref 0–200)
HDL: 43.5 mg/dL (ref >39.00)
NonHDL: 185.67
Total CHOL/HDL Ratio: 5
Triglycerides: 291 mg/dL — ABNORMAL HIGH (ref 0.0–149.0)
VLDL: 58.2 mg/dL — ABNORMAL HIGH (ref 0.0–40.0)

## 2019-01-25 LAB — COMPREHENSIVE METABOLIC PANEL
ALT: 38 U/L — ABNORMAL HIGH (ref 0–35)
AST: 27 U/L (ref 0–37)
Albumin: 4.7 g/dL (ref 3.5–5.2)
Alkaline Phosphatase: 33 U/L — ABNORMAL LOW (ref 39–117)
BUN: 19 mg/dL (ref 6–23)
CO2: 29 mEq/L (ref 19–32)
Calcium: 9.9 mg/dL (ref 8.4–10.5)
Chloride: 101 mEq/L (ref 96–112)
Creatinine, Ser: 0.83 mg/dL (ref 0.40–1.20)
GFR: 68.49 mL/min (ref >60.00)
Glucose, Bld: 133 mg/dL — ABNORMAL HIGH (ref 70–99)
Potassium: 3.6 mEq/L (ref 3.5–5.1)
Sodium: 141 mEq/L (ref 135–145)
Total Bilirubin: 0.6 mg/dL (ref 0.2–1.2)
Total Protein: 7.1 g/dL (ref 6.0–8.3)

## 2019-01-25 LAB — HEMOGLOBIN A1C: Hgb A1c MFr Bld: 5.8 % (ref 4.6–6.5)

## 2019-01-25 LAB — LDL CHOLESTEROL, DIRECT: Direct LDL: 160 mg/dL

## 2019-01-25 NOTE — Progress Notes (Addendum)
PCP notes:  Health Maintenance:  Tetanus is due. Wants to discuss shingles vaccine further.  Abnormal Screenings:  None  Patient concerns:  None  Nurse concerns:  None  Next PCP appt.: 02/01/2019 at 9:20  I reviewed health advisor's note, was available for consultation, and agree with documentation and plan. Loura Pardon MD

## 2019-01-25 NOTE — Patient Instructions (Signed)
Amy Wilcox , Thank you for taking time to come for your Medicare Wellness Visit. I appreciate your ongoing commitment to your health goals. Please review the following plan we discussed and let me know if I can assist you in the future.   Screening recommendations/referrals: Colonoscopy: 08/2015 Mammogram: 11/2017 Bone Density: 11/2017 Recommended yearly ophthalmology/optometry visit for glaucoma screening and checkup Recommended yearly dental visit for hygiene and checkup  Vaccinations: Influenza vaccine: 04/2018 Pneumococcal vaccine: 09/2017 Tdap vaccine: due Shingles vaccine: discussed    Advanced directives: Please bring a copy of your POA (Power of Attorney) and/or Living Will to your next appointment.    Conditions/risks identified: obesity  Next appointment: 02/01/2019 at 9:20   Preventive Care 78 Years and Older, Female Preventive care refers to lifestyle choices and visits with your health care provider that can promote health and wellness. What does preventive care include?  A yearly physical exam. This is also called an annual well check.  Dental exams once or twice a year.  Routine eye exams. Ask your health care provider how often you should have your eyes checked.  Personal lifestyle choices, including:  Daily care of your teeth and gums.  Regular physical activity.  Eating a healthy diet.  Avoiding tobacco and drug use.  Limiting alcohol use.  Practicing safe sex.  Taking low-dose aspirin every day.  Taking vitamin and mineral supplements as recommended by your health care provider. What happens during an annual well check? The services and screenings done by your health care provider during your annual well check will depend on your age, overall health, lifestyle risk factors, and family history of disease. Counseling  Your health care provider may ask you questions about your:  Alcohol use.  Tobacco use.  Drug use.  Emotional well-being.  Home  and relationship well-being.  Sexual activity.  Eating habits.  History of falls.  Memory and ability to understand (cognition).  Work and work Statistician.  Reproductive health. Screening  You may have the following tests or measurements:  Height, weight, and BMI.  Blood pressure.  Lipid and cholesterol levels. These may be checked every 5 years, or more frequently if you are over 31 years old.  Skin check.  Lung cancer screening. You may have this screening every year starting at age 63 if you have a 30-pack-year history of smoking and currently smoke or have quit within the past 15 years.  Fecal occult blood test (FOBT) of the stool. You may have this test every year starting at age 63.  Flexible sigmoidoscopy or colonoscopy. You may have a sigmoidoscopy every 5 years or a colonoscopy every 10 years starting at age 59.  Hepatitis C blood test.  Hepatitis B blood test.  Sexually transmitted disease (STD) testing.  Diabetes screening. This is done by checking your blood sugar (glucose) after you have not eaten for a while (fasting). You may have this done every 1-3 years.  Bone density scan. This is done to screen for osteoporosis. You may have this done starting at age 38.  Mammogram. This may be done every 1-2 years. Talk to your health care provider about how often you should have regular mammograms. Talk with your health care provider about your test results, treatment options, and if necessary, the need for more tests. Vaccines  Your health care provider may recommend certain vaccines, such as:  Influenza vaccine. This is recommended every year.  Tetanus, diphtheria, and acellular pertussis (Tdap, Td) vaccine. You may need a Td booster  every 10 years.  Zoster vaccine. You may need this after age 37.  Pneumococcal 13-valent conjugate (PCV13) vaccine. One dose is recommended after age 36.  Pneumococcal polysaccharide (PPSV23) vaccine. One dose is recommended  after age 53. Talk to your health care provider about which screenings and vaccines you need and how often you need them. This information is not intended to replace advice given to you by your health care provider. Make sure you discuss any questions you have with your health care provider. Document Released: 07/21/2015 Document Revised: 03/13/2016 Document Reviewed: 04/25/2015 Elsevier Interactive Patient Education  2017 [place] Prevention in the Home Falls can cause injuries. They can happen to people of all ages. There are many things you can do to make your home safe and to help prevent falls. What can I do on the outside of my home?  Regularly fix the edges of walkways and driveways and fix any cracks.  Remove anything that might make you trip as you walk through a door, such as a raised step or threshold.  Trim any bushes or trees on the path to your home.  Use bright outdoor lighting.  Clear any walking paths of anything that might make someone trip, such as rocks or tools.  Regularly check to see if handrails are loose or broken. Make sure that both sides of any steps have handrails.  Any raised decks and porches should have guardrails on the edges.  Have any leaves, snow, or ice cleared regularly.  Use sand or salt on walking paths during winter.  Clean up any spills in your garage right away. This includes oil or grease spills. What can I do in the bathroom?  Use night lights.  Install grab bars by the toilet and in the tub and shower. Do not use towel bars as grab bars.  Use non-skid mats or decals in the tub or shower.  If you need to sit down in the shower, use a plastic, non-slip stool.  Keep the floor dry. Clean up any water that spills on the floor as soon as it happens.  Remove soap buildup in the tub or shower regularly.  Attach bath mats securely with double-sided non-slip rug tape.  Do not have throw rugs and other things on the floor  that can make you trip. What can I do in the bedroom?  Use night lights.  Make sure that you have a light by your bed that is easy to reach.  Do not use any sheets or blankets that are too big for your bed. They should not hang down onto the floor.  Have a firm chair that has side arms. You can use this for support while you get dressed.  Do not have throw rugs and other things on the floor that can make you trip. What can I do in the kitchen?  Clean up any spills right away.  Avoid walking on wet floors.  Keep items that you use a lot in easy-to-reach places.  If you need to reach something above you, use a strong step stool that has a grab bar.  Keep electrical cords out of the way.  Do not use floor polish or wax that makes floors slippery. If you must use wax, use non-skid floor wax.  Do not have throw rugs and other things on the floor that can make you trip. What can I do with my stairs?  Do not leave any items on the stairs.  Make  sure that there are handrails on both sides of the stairs and use them. Fix handrails that are broken or loose. Make sure that handrails are as long as the stairways.  Check any carpeting to make sure that it is firmly attached to the stairs. Fix any carpet that is loose or worn.  Avoid having throw rugs at the top or bottom of the stairs. If you do have throw rugs, attach them to the floor with carpet tape.  Make sure that you have a light switch at the top of the stairs and the bottom of the stairs. If you do not have them, ask someone to add them for you. What else can I do to help prevent falls?  Wear shoes that:  Do not have high heels.  Have rubber bottoms.  Are comfortable and fit you well.  Are closed at the toe. Do not wear sandals.  If you use a stepladder:  Make sure that it is fully opened. Do not climb a closed stepladder.  Make sure that both sides of the stepladder are locked into place.  Ask someone to hold it  for you, if possible.  Clearly mark and make sure that you can see:  Any grab bars or handrails.  First and last steps.  Where the edge of each step is.  Use tools that help you move around (mobility aids) if they are needed. These include:  Canes.  Walkers.  Scooters.  Crutches.  Turn on the lights when you go into a dark area. Replace any light bulbs as soon as they burn out.  Set up your furniture so you have a clear path. Avoid moving your furniture around.  If any of your floors are uneven, fix them.  If there are any pets around you, be aware of where they are.  Review your medicines with your doctor. Some medicines can make you feel dizzy. This can increase your chance of falling. Ask your doctor what other things that you can do to help prevent falls. This information is not intended to replace advice given to you by your health care provider. Make sure you discuss any questions you have with your health care provider. Document Released: 04/20/2009 Document Revised: 11/30/2015 Document Reviewed: 07/29/2014 Elsevier Interactive Patient Education  2017 [employer].

## 2019-01-25 NOTE — Progress Notes (Signed)
Subjective:   Amy Wilcox is a 68 y.o. female who presents for Medicare Annual (Subsequent) preventive examination.  This visit type was conducted due to national recommendations for restrictions regarding the COVID-19 Pandemic (e.g. social distancing). This format is felt to be most appropriate for this patient at this time. All issues noted in this document were discussed and addressed. No physical exam was performed (except for noted visual exam findings with Video Visits). This patient, [name], has given permission to perform this visit via telephone. Vital signs may be absent or patient reported.  Patient location:  At home  Nurse location:  At home     Review of Systems:  n/a Cardiac Risk Factors include: advanced age (>13men, >36 women);dyslipidemia;hypertension;obesity (BMI >30kg/m2);sedentary lifestyle     Objective:     Vitals: BP 139/75 Comment: per patient  Pulse 68 Comment: per patient  Temp 98.2 F (36.8 C) Comment: per patient  Ht 5\' 3"  (1.6 m) Comment: per patient  Wt 172 lb (78 kg) Comment: per patient  BMI 30.47 kg/m   Body mass index is 30.47 kg/m.  Advanced Directives 01/25/2019 09/22/2017  Does Patient Have a Medical Advance Directive? Yes Yes  Type of Paramedic of [place];Living will [place];Living will  Does patient want to make changes to medical advance directive? No - Patient declined -  Copy of [place] in Chart? No - copy requested No - copy requested    Tobacco Social History   Tobacco Use  Smoking Status Never Smoker  Smokeless Tobacco Never Used     Counseling given: Not Answered   Clinical Intake:  Pre-visit preparation completed: Yes  Pain : No/denies pain     Nutritional Status: BMI > 30  Obese Nutritional Risks: Nausea/ vomitting/ diarrhea(has IBS) Diabetes: No  How often do you need to have someone help you when you read instructions, pamphlets, or  other written materials from your doctor or pharmacy?: 1 - Never What is the last grade level you completed in school?: some college  Interpreter Needed?: No  Information entered by :: NAllen LPN  Past Medical History:  Diagnosis Date  . Essential hypertension   . Fibrocystic breast   . Hyperlipidemia   . IBS (irritable bowel syndrome)   . Idiopathic hematuria   . OSA (obstructive sleep apnea)    Past Surgical History:  Procedure Laterality Date  . TONSILLECTOMY AND ADENOIDECTOMY  1961   Family History  Problem Relation Age of Onset  . Hypertension Mother   . Hyperlipidemia Mother   . Alzheimer's disease Mother   . Heart disease Father   . Heart attack Father   . Hyperlipidemia Father   . Hypertension Father    Social History   Socioeconomic History  . Marital status: Married    Spouse name: Not on file  . Number of children: Not on file  . Years of education: Not on file  . Highest education level: Not on file  Occupational History  . Occupation: retired  Scientific laboratory technician  . Financial resource strain: Not hard at all  . Food insecurity    Worry: Never true    Inability: Never true  . Transportation needs    Medical: No    Non-medical: No  Tobacco Use  . Smoking status: Never Smoker  . Smokeless tobacco: Never Used  Substance and Sexual Activity  . Alcohol use: No    Frequency: Never  . Drug use: No  .  Sexual activity: Yes  Lifestyle  . Physical activity    Days per week: 0 days    Minutes per session: 0 min  . Stress: Not at all  Relationships  . Social Herbalist on phone: Not on file    Gets together: Not on file    Attends religious service: Not on file    Active member of club or organization: Not on file    Attends meetings of clubs or organizations: Not on file    Relationship status: Not on file  Other Topics Concern  . Not on file  Social History Narrative   Married.   3 children.   Retired. Once worked a Agricultural engineer.   Enjoys  spending time with family.    Outpatient Encounter Medications as of 01/25/2019  Medication Sig  . aspirin EC 81 MG tablet Take 81 mg by mouth daily.  Marland Kitchen CALCIUM PO Take by mouth.  . Cholecalciferol (VITAMIN D) 2000 units CAPS Take by mouth.  . cyanocobalamin 500 MCG tablet Take 500 mcg by mouth daily.  Marland Kitchen ezetimibe (ZETIA) 10 MG tablet TAKE 1 TABLET BY MOUTH EVERY DAY  . famotidine (PEPCID) 20 MG tablet Take 20 mg by mouth daily as needed for heartburn or indigestion.  . hydrOXYzine (ATARAX/VISTARIL) 10 MG tablet Take 1-2 tablets (10-20 mg total) by mouth 2 (two) times daily as needed for anxiety.  Marland Kitchen loperamide (IMODIUM) 2 MG capsule Take 1 mg by mouth at bedtime.   . metoprolol succinate (TOPROL-XL) 50 MG 24 hr tablet TAKE 1 TABLET (50 MG TOTAL) BY MOUTH DAILY. TAKE WITH OR IMMEDIATELY FOLLOWING A MEAL.  Marland Kitchen Omega-3 Fatty Acids (FISH OIL PO) Take 1,200 mg by mouth 2 (two) times daily.  . valsartan-hydrochlorothiazide (DIOVAN-HCT) 80-12.5 MG tablet TAKE 1 TABLET BY MOUTH EVERY DAY   No facility-administered encounter medications on file as of 01/25/2019.     Activities of Daily Living In your present state of health, do you have any difficulty performing the following activities: 01/25/2019  Hearing? N  Vision? N  Difficulty concentrating or making decisions? N  Walking or climbing stairs? N  Dressing or bathing? N  Doing errands, shopping? N  Preparing Food and eating ? N  Using the Toilet? N  In the past six months, have you accidently leaked urine? N  Do you have problems with loss of bowel control? Y  Comment sometimes with IBS  Managing your Medications? N  Managing your Finances? N  Housekeeping or managing your Housekeeping? N  Some recent data might be hidden    Patient Care Team: [name], NP as PCP - General (Internal Medicine)    Assessment:   This is a routine wellness examination for Amy Wilcox.  Exercise Activities and Dietary recommendations Current  Exercise Habits: The patient does not participate in regular exercise at present  Goals    . DIET - INCREASE WATER INTAKE     Starting 09/22/2017, I will attempt to drink at least 3 glasses of water daily. Goal is 6-8 glasses of water daily.     . Weight (lb) < 200 lb (90.7 kg)     Wants to lose at least 10 pounds       Fall Risk Fall Risk  01/25/2019 09/22/2017  Falls in the past year? 0 No  Risk for fall due to : Medication side effect -  Follow up Falls evaluation completed;Falls prevention discussed -   Is the patient's home free of loose  throw rugs in walkways, pet beds, electrical cords, etc?   yes      Grab bars in the bathroom? yes      Handrails on the stairs?   yes      Adequate lighting?   yes  Timed Get Up and Go performed: n/a  Depression Screen PHQ 2/9 Scores 01/25/2019 09/22/2017  PHQ - 2 Score 0 0  PHQ- 9 Score 0 0     Cognitive Function MMSE - Mini Mental State Exam 01/25/2019 09/22/2017  Orientation to time 5 5  Orientation to Place 5 5  Registration 3 3  Attention/ Calculation 5 0  Recall 3 3  Language- name 2 objects 0 0  Language- repeat 1 1  Language- follow 3 step command 0 3  Language- read & follow direction 0 0  Write a sentence 0 0  Copy design 0 0  Total score 22 20   Mini Cog  Mini-Cog screen was completed. Maximum score is 22. A value of 0 denotes this part of the MMSE was not completed or the patient failed this part of the Mini-Cog screening.       Immunization History  Administered Date(s) Administered  . Influenza, High Dose Seasonal PF 04/08/2018  . Pneumococcal Conjugate-13 04/30/2016  . Pneumococcal Polysaccharide-23 10/02/2017  . Tdap 03/17/2007    Qualifies for Shingles Vaccine? yes  Screening Tests Health Maintenance  Topic Date Due  . TETANUS/TDAP  03/16/2017  . INFLUENZA VACCINE  02/06/2019  . MAMMOGRAM  11/12/2019  . COLONOSCOPY  01/22/2026  . DEXA SCAN  Completed  . Hepatitis C Screening  Completed  . PNA vac  Low Risk Adult  Completed    Cancer Screenings: Lung: Low Dose CT Chest recommended if Age 52-80 years, 30 pack-year currently smoking OR have quit w/in 15years. Patient does not qualify. Breast:  Up to date on Mammogram? Yes   Up to date of Bone Density/Dexa? Yes Colorectal: up to date  Additional Screenings: : Hepatitis C Screening: 12/2017     Plan:    Wants to lose at least 10 pounds. Will consider doing some exercise routines from youtube.   I have personally reviewed and noted the following in the patient's chart:   . Medical and social history . Use of alcohol, tobacco or illicit drugs  . Current medications and supplements . Functional ability and status . Nutritional status . Physical activity . Advanced directives . List of other physicians . Hospitalizations, surgeries, and ER visits in previous 12 months . Vitals . Screenings to include cognitive, depression, and falls . Referrals and appointments  In addition, I have reviewed and discussed with patient certain preventive protocols, quality metrics, and best practice recommendations. A written personalized care plan for preventive services as well as general preventive health recommendations were provided to patient.     Kellie Simmering, LPN  [id number]/41/[id number]

## 2019-02-01 ENCOUNTER — Other Ambulatory Visit: Payer: Self-pay

## 2019-02-01 ENCOUNTER — Ambulatory Visit (INDEPENDENT_AMBULATORY_CARE_PROVIDER_SITE_OTHER): Payer: Medicare Other | Admitting: Primary Care

## 2019-02-01 ENCOUNTER — Encounter: Payer: Self-pay | Admitting: Primary Care

## 2019-02-01 ENCOUNTER — Ambulatory Visit (INDEPENDENT_AMBULATORY_CARE_PROVIDER_SITE_OTHER)
Admission: RE | Admit: 2019-02-01 | Discharge: 2019-02-01 | Disposition: A | Discharge status: Home / Self Care | Payer: Medicare Other | Source: Ambulatory Visit | Attending: Primary Care | Admitting: Primary Care

## 2019-02-01 VITALS — BP 124/76 | HR 70 | Temp 97.8°F | Ht 63.0 in | Wt 173.5 lb

## 2019-02-01 DIAGNOSIS — K760 Fatty (change of) liver, not elsewhere classified: Secondary | ICD-10-CM

## 2019-02-01 DIAGNOSIS — R7303 Prediabetes: Secondary | ICD-10-CM | POA: Diagnosis not present

## 2019-02-01 DIAGNOSIS — I10 Essential (primary) hypertension: Secondary | ICD-10-CM

## 2019-02-01 DIAGNOSIS — E785 Hyperlipidemia, unspecified: Secondary | ICD-10-CM

## 2019-02-01 DIAGNOSIS — K580 Irritable bowel syndrome with diarrhea: Secondary | ICD-10-CM

## 2019-02-01 DIAGNOSIS — G4733 Obstructive sleep apnea (adult) (pediatric): Secondary | ICD-10-CM

## 2019-02-01 DIAGNOSIS — M25551 Pain in right hip: Secondary | ICD-10-CM

## 2019-02-01 DIAGNOSIS — G8929 Other chronic pain: Secondary | ICD-10-CM

## 2019-02-01 DIAGNOSIS — F418 Other specified anxiety disorders: Secondary | ICD-10-CM

## 2019-02-01 DIAGNOSIS — Z1239 Encounter for other screening for malignant neoplasm of breast: Secondary | ICD-10-CM

## 2019-02-01 DIAGNOSIS — Z23 Encounter for immunization: Secondary | ICD-10-CM | POA: Diagnosis not present

## 2019-02-01 MED ORDER — ZOSTER VAC RECOMB ADJUVANTED 50 MCG/0.5ML IM SUSR: 0.5000 mL | Freq: Once | INTRAMUSCULAR | 1 refills | Status: AC

## 2019-02-01 NOTE — Patient Instructions (Signed)
Complete xray(s) prior to leaving today. I will notify you of your results once received.  You will be contacted regarding your referral to orthopedics.  Please let us know if you have not been contacted within one week.   Start exercising. You should be getting 150 minutes of moderate intensity exercise weekly.  It's important to improve your diet by reducing consumption of fast food, fried food, processed snack foods, sugary drinks. Increase consumption of fresh vegetables and fruits, whole grains, water.  Ensure you are drinking 64 ounces of water daily.  Call the breast center to schedule your mammogram.  Take the shingles vaccination to your pharmacy.  Continue calcium and vitamin D daily.  It was a pleasure to see you today!

## 2019-02-01 NOTE — Assessment & Plan Note (Addendum)
Absolutely intolerant to statin therapy. LDL at 160 on recent labs, compliant to Zetia. Cannot take Fish Oil due to GERD symptoms.  Discussed risks of uncontrolled hyperlipidemia, she verbalized understanding and would like to proceed off of statin therapy. Continue Zetia.  The 10-year ASCVD risk score Mikey Bussing DC Brooke Bonito., et al., 2013) is: 9.9%   Values used to calculate the score:     Age: 68 years     Sex: Female     Is Non-Hispanic African American: No     Diabetic: No     Tobacco smoker: No     Systolic Blood Pressure: 958 mmHg     Is BP treated: Yes     HDL Cholesterol: 43.5 mg/dL     Total Cholesterol: 229 mg/dL

## 2019-02-01 NOTE — Assessment & Plan Note (Signed)
Compliant to CPAP machine nightly. Continue same. 

## 2019-02-01 NOTE — Assessment & Plan Note (Signed)
Chronic to right hip for about one year. No radiation of pain. Slight decrease in ROM due to pain with external rotation and flexion.  Check plain films today. Referral placed to orthopedics for evaluation.

## 2019-02-01 NOTE — Assessment & Plan Note (Signed)
Recent A1C about the same as last year. Encouraged regular exercise, healthy diet. Continue to monitor.

## 2019-02-01 NOTE — Assessment & Plan Note (Signed)
Recent LFT's stable, nearly normal. Continue to monitor.

## 2019-02-01 NOTE — Assessment & Plan Note (Signed)
Improved, using hydroxyzine PRN with improvement. Continue same.

## 2019-02-01 NOTE — Assessment & Plan Note (Signed)
Following with GI. Taking Imodium nightly with improvement. Will be scheduled for colonoscopy per GI.

## 2019-02-01 NOTE — Progress Notes (Signed)
Subjective:    Patient ID: Amy Wilcox, female    DOB: [birth year], 68 y.o.   MRN: [id number]  HPI  Amy Wilcox is a 68 year old female who presents today for [place] Part 2. She was evaluated by our health advisor last week. She also reports chronic right hip pain.  Chronic pain to the right hip for nearly one year. She denies injury/trauma. Pain is with ROM such as climbing stairs, sitting or standing for prolonged periods of time. She denies weakness, radiculopathy. 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  Immunizations: -Tetanus: Completed in 2008 -Influenza: Due this season  -Pneumonia: Completed course. -Shingles: Never completed.   Diet: She endorses a healthy diet. Exercise: She is not exercising.  Colonoscopy: Completed in 2009, did Cologuard in 2018 Mammogram: Completed in 2019 Dexa: Completed in 2019, osteopenia. Compliant to calcium and vitamin D. Hep C Screen: Negative  BP Readings from Last 3 Encounters:  02/01/19 124/76  01/25/19 139/75  05/11/18 122/76      Review of Systems  Eyes: Negative for visual disturbance.  Respiratory: Negative for shortness of breath.   Cardiovascular: Negative for chest pain.  Gastrointestinal:       Chronic IBS diarrhea type symptoms  Neurological: Negative for dizziness.  Psychiatric/Behavioral:       Doing well on PRN hydroxyzine, continue same.       Past Medical History:  Diagnosis Date  . Essential hypertension   . Fibrocystic breast   . Hyperlipidemia   . IBS (irritable bowel syndrome)   . Idiopathic hematuria   . OSA (obstructive sleep apnea)      Social History   Socioeconomic History  . Marital status: Married    Spouse name: Not on file  . Number of children: Not on file  . Years of education: Not on file  . Highest education level: Not on file  Occupational History  . Occupation: retired  Scientific laboratory technician  . Financial resource strain: Not hard at all  . Food  insecurity    Worry: Never true    Inability: Never true  . Transportation needs    Medical: No    Non-medical: No  Tobacco Use  . Smoking status: Never Smoker  . Smokeless tobacco: Never Used  Substance and Sexual Activity  . Alcohol use: No    Frequency: Never  . Drug use: No  . Sexual activity: Yes  Lifestyle  . Physical activity    Days per week: 0 days    Minutes per session: 0 min  . Stress: Not at all  Relationships  . Social Herbalist on phone: Not on file    Gets together: Not on file    Attends religious service: Not on file    Active member of club or organization: Not on file    Attends meetings of clubs or organizations: Not on file    Relationship status: Not on file  . Intimate partner violence    Fear of current or ex partner: No    Emotionally abused: No    Physically abused: No    Forced sexual activity: No  Other Topics Concern  . Not on file  Social History Narrative   Married.   3 children.   Retired. Once worked a Agricultural engineer.   Enjoys spending time with family.    Past Surgical History:  Procedure Laterality Date  . [place]  Family History  Problem Relation Age of Onset  . Hypertension Mother   . Hyperlipidemia Mother   . Alzheimer's disease Mother   . Heart disease Father   . Heart attack Father   . Hyperlipidemia Father   . Hypertension Father     Allergies  Allergen Reactions  . Livalo [Pitavastatin]   . Statins     Current Outpatient Medications on File Prior to Visit  Medication Sig Dispense Refill  . aspirin EC 81 MG tablet Take 81 mg by mouth daily.    Marland Kitchen CALCIUM PO Take by mouth.    . Cholecalciferol (VITAMIN D) 2000 units CAPS Take by mouth.    . cyanocobalamin 500 MCG tablet Take 500 mcg by mouth daily.    Marland Kitchen ezetimibe (ZETIA) 10 MG tablet TAKE 1 TABLET BY MOUTH EVERY DAY 90 tablet 1  . famotidine (PEPCID) 20 MG tablet Take 20 mg by mouth daily as needed for heartburn or  indigestion.    . hydrOXYzine (ATARAX/VISTARIL) 10 MG tablet Take 1-2 tablets (10-20 mg total) by mouth 2 (two) times daily as needed for anxiety. 30 tablet 0  . loperamide (IMODIUM) 2 MG capsule Taking 1/2 capsule by mouth at bedtime    . metoprolol succinate (TOPROL-XL) 50 MG 24 hr tablet TAKE 1 TABLET (50 MG TOTAL) BY MOUTH DAILY. TAKE WITH OR IMMEDIATELY FOLLOWING A MEAL. 90 tablet 1  . Omega-3 Fatty Acids (FISH OIL PO) Take 1,200 mg by mouth 2 (two) times daily.    . valsartan-hydrochlorothiazide (DIOVAN-HCT) 80-12.5 MG tablet TAKE 1 TABLET BY MOUTH EVERY DAY 90 tablet 1   No current facility-administered medications on file prior to visit.     BP 124/76   Pulse 70   Temp 97.8 F (36.6 C) (Temporal)   Ht 5\' 3"  (1.6 m)   Wt 173 lb 8 oz (78.7 kg)   SpO2 98%   BMI 30.73 kg/m    Objective:   Physical Exam  Constitutional: She appears well-nourished.  Neck: Neck supple.  Cardiovascular: Normal rate and regular rhythm.  Respiratory: Effort normal and breath sounds normal.  Skin: Skin is warm and dry.  Psychiatric: She has a normal mood and affect.           Assessment & Plan:

## 2019-02-01 NOTE — Assessment & Plan Note (Signed)
Stable in the office today, continue to monitor. BMP reviewed.

## 2019-02-16 DIAGNOSIS — G8929 Other chronic pain: Secondary | ICD-10-CM | POA: Diagnosis not present

## 2019-02-16 DIAGNOSIS — M47816 Spondylosis without myelopathy or radiculopathy, lumbar region: Secondary | ICD-10-CM | POA: Diagnosis not present

## 2019-02-16 DIAGNOSIS — M25551 Pain in right hip: Secondary | ICD-10-CM | POA: Diagnosis not present

## 2019-04-02 DIAGNOSIS — Z23 Encounter for immunization: Secondary | ICD-10-CM | POA: Diagnosis not present

## 2019-04-07 ENCOUNTER — Other Ambulatory Visit: Payer: Self-pay

## 2019-04-07 DIAGNOSIS — F418 Other specified anxiety disorders: Secondary | ICD-10-CM

## 2019-04-08 ENCOUNTER — Other Ambulatory Visit: Payer: Self-pay | Admitting: Primary Care

## 2019-04-08 DIAGNOSIS — Z1231 Encounter for screening mammogram for malignant neoplasm of breast: Secondary | ICD-10-CM

## 2019-04-08 MED ORDER — HYDROXYZINE HCL 10 MG PO TABS: 10.0000 mg | ORAL_TABLET | Freq: Two times a day (BID) | ORAL | 0 refills | Status: DC | PRN

## 2019-04-13 ENCOUNTER — Other Ambulatory Visit: Payer: Self-pay | Admitting: Primary Care

## 2019-05-10 DIAGNOSIS — H2513 Age-related nuclear cataract, bilateral: Secondary | ICD-10-CM | POA: Diagnosis not present

## 2019-05-10 DIAGNOSIS — H16223 Keratoconjunctivitis sicca, not specified as Sjogren's, bilateral: Secondary | ICD-10-CM | POA: Diagnosis not present

## 2019-05-10 DIAGNOSIS — H02882 Meibomian gland dysfunction right lower eyelid: Secondary | ICD-10-CM | POA: Diagnosis not present

## 2019-05-10 DIAGNOSIS — H02885 Meibomian gland dysfunction left lower eyelid: Secondary | ICD-10-CM | POA: Diagnosis not present

## 2019-05-14 ENCOUNTER — Ambulatory Visit
Admission: RE | Admit: 2019-05-14 | Discharge: 2019-05-14 | Disposition: A | Discharge status: Home / Self Care | Payer: Medicare Other | Source: Ambulatory Visit | Attending: Primary Care | Admitting: Primary Care

## 2019-05-14 DIAGNOSIS — Z1231 Encounter for screening mammogram for malignant neoplasm of breast: Secondary | ICD-10-CM | POA: Diagnosis not present

## 2019-06-02 ENCOUNTER — Other Ambulatory Visit: Payer: Self-pay

## 2019-06-18 ENCOUNTER — Other Ambulatory Visit: Payer: Self-pay

## 2019-06-18 DIAGNOSIS — F418 Other specified anxiety disorders: Secondary | ICD-10-CM

## 2019-06-21 MED ORDER — HYDROXYZINE HCL 10 MG PO TABS: 10.0000 mg | ORAL_TABLET | Freq: Two times a day (BID) | ORAL | 0 refills | Status: DC | PRN

## 2019-06-21 NOTE — Telephone Encounter (Signed)
Refill sent to pharmacy.   

## 2019-06-21 NOTE — Telephone Encounter (Signed)
Last prescribed on 04/08/2019 . Last appointment on 02/01/2019. Next future appointment on 08/06/2019

## 2019-07-26 ENCOUNTER — Other Ambulatory Visit: Payer: Self-pay | Admitting: Primary Care

## 2019-07-26 DIAGNOSIS — R7303 Prediabetes: Secondary | ICD-10-CM

## 2019-07-26 DIAGNOSIS — E785 Hyperlipidemia, unspecified: Secondary | ICD-10-CM

## 2019-08-03 ENCOUNTER — Other Ambulatory Visit (INDEPENDENT_AMBULATORY_CARE_PROVIDER_SITE_OTHER): Payer: Medicare Other

## 2019-08-03 ENCOUNTER — Other Ambulatory Visit: Payer: Self-pay

## 2019-08-03 DIAGNOSIS — E785 Hyperlipidemia, unspecified: Secondary | ICD-10-CM

## 2019-08-03 DIAGNOSIS — R7303 Prediabetes: Secondary | ICD-10-CM

## 2019-08-03 LAB — LIPID PANEL
Cholesterol: 226 mg/dL — ABNORMAL HIGH (ref 0–200)
HDL: 41 mg/dL (ref >39.00)
NonHDL: 184.74
Total CHOL/HDL Ratio: 6
Triglycerides: 343 mg/dL — ABNORMAL HIGH (ref 0.0–149.0)
VLDL: 68.6 mg/dL — ABNORMAL HIGH (ref 0.0–40.0)

## 2019-08-03 LAB — HEMOGLOBIN A1C: Hgb A1c MFr Bld: 5.9 % (ref 4.6–6.5)

## 2019-08-03 LAB — LDL CHOLESTEROL, DIRECT: Direct LDL: 142 mg/dL

## 2019-08-06 ENCOUNTER — Other Ambulatory Visit: Payer: Self-pay

## 2019-08-06 ENCOUNTER — Ambulatory Visit (INDEPENDENT_AMBULATORY_CARE_PROVIDER_SITE_OTHER): Payer: Medicare Other | Admitting: Primary Care

## 2019-08-06 ENCOUNTER — Encounter: Payer: Self-pay | Admitting: Primary Care

## 2019-08-06 DIAGNOSIS — R7303 Prediabetes: Secondary | ICD-10-CM | POA: Diagnosis not present

## 2019-08-06 DIAGNOSIS — I10 Essential (primary) hypertension: Secondary | ICD-10-CM | POA: Diagnosis not present

## 2019-08-06 DIAGNOSIS — E785 Hyperlipidemia, unspecified: Secondary | ICD-10-CM

## 2019-08-06 NOTE — Assessment & Plan Note (Signed)
Home readings stable, continue current regimen.

## 2019-08-06 NOTE — Patient Instructions (Signed)
Start exercising. You should be getting 150 minutes of moderate intensity exercise weekly.  Continue to work on a healthy diet. Ensure you are consuming 64 ounces of water daily.  We will see you in July/August 2021 for your annual exam.  It was a pleasure to see you today! [name], NP-C

## 2019-08-06 NOTE — Progress Notes (Signed)
Subjective:    Patient ID: Amy Wilcox, female    DOB: 1950/07/29, 69 y.o.   MRN: [id number]  HPI  Virtual Visit via Video Note  I connected with Amy Wilcox on 08/06/19 at  9:00 AM EST by a video enabled telemedicine application and verified that I am speaking with the correct person using two identifiers.  Location: Patient: Home Provider: Office   I discussed the limitations of evaluation and management by telemedicine and the availability of in person appointments. The patient expressed understanding and agreed to proceed.  History of Present Illness:  Amy Wilcox is a 69 year old female who presents today for follow up.  1) Prediabetes: Recent A1C of 5.8 which is about the same as A1C from July 2020. Since her last visit she's working on improving her diet, has been limiting fried/fatty foods. She thinks Christmas kept her reading from decreasing due to cookies, etc. She is motivated to work on weight loss.   2) Hyperlipidemia: Currently managed on Zetia. She is absolutely intolerant to all statin therapy. LDL of 160 in July 2020, now 142. Since her last visit she's avoiding fatty/fried food. She just resumed her fish oil use as she realized that it doesn't cause GERD.  BP Readings from Last 3 Encounters:  08/06/19 130/70  02/01/19 124/76  01/25/19 139/75       Observations/Objective:  Alert and oriented. Appears well, not sickly. No distress. Speaking in complete sentences.   Assessment and Plan:  See problem based charting.  Follow Up Instructions:  Start exercising. You should be getting 150 minutes of moderate intensity exercise weekly.  Continue to work on a healthy diet. Ensure you are consuming 64 ounces of water daily.  We will see you in July/August 2021 for your annual exam.  It was a pleasure to see you today! Amy Bossier, NP-C    I discussed the assessment and treatment plan with the patient. The patient was provided an opportunity to ask questions  and all were answered. The patient agreed with the plan and demonstrated an understanding of the instructions.   The patient was advised to call back or seek an in-person evaluation if the symptoms worsen or if the condition fails to improve as anticipated.   Amy Koch, NP     Review of Systems  Respiratory: Negative for shortness of breath.   Cardiovascular: Negative for chest pain.  Neurological: Negative for dizziness and headaches.       Past Medical History:  Diagnosis Date  . Essential hypertension   . Fibrocystic breast   . Hyperlipidemia   . IBS (irritable bowel syndrome)   . Idiopathic hematuria   . OSA (obstructive sleep apnea)      Social History   Socioeconomic History  . Marital status: Married    Spouse name: Not on file  . Number of children: Not on file  . Years of education: Not on file  . Highest education level: Not on file  Occupational History  . Occupation: retired  Tobacco Use  . Smoking status: Never Smoker  . Smokeless tobacco: Never Used  Substance and Sexual Activity  . Alcohol use: No  . Drug use: No  . Sexual activity: Yes  Other Topics Concern  . Not on file  Social History Narrative   Married.   3 children.   Retired. Once worked a Agricultural engineer.   Enjoys spending time with family.   Social Determinants of Health   Financial Resource Strain: Low  Risk   . Difficulty of Paying Living Expenses: Not hard at all  Food Insecurity: No Food Insecurity  . Worried About Charity fundraiser in the Last Year: Never true  . Ran Out of Food in the Last Year: Never true  Transportation Needs: No Transportation Needs  . Lack of Transportation (Medical): No  . Lack of Transportation (Non-Medical): No  Physical Activity: Inactive  . Days of Exercise per Week: 0 days  . Minutes of Exercise per Session: 0 min  Stress: No Stress Concern Present  . Feeling of Stress : Not at all  Social Connections:   . Frequency of Communication with  Friends and Family: Not on file  . Frequency of Social Gatherings with Friends and Family: Not on file  . Attends Religious Services: Not on file  . Active Member of Clubs or Organizations: Not on file  . Attends Archivist Meetings: Not on file  . Marital Status: Not on file  Intimate Partner Violence: Not At Risk  . Fear of Current or Ex-Partner: No  . Emotionally Abused: No  . Physically Abused: No  . Sexually Abused: No    Past Surgical History:  Procedure Laterality Date  . TONSILLECTOMY AND ADENOIDECTOMY  1961    Family History  Problem Relation Age of Onset  . Hypertension Mother   . Hyperlipidemia Mother   . Alzheimer's disease Mother   . Heart disease Father   . Heart attack Father   . Hyperlipidemia Father   . Hypertension Father     Allergies  Allergen Reactions  . Livalo [Pitavastatin]   . Statins     Current Outpatient Medications on File Prior to Visit  Medication Sig Dispense Refill  . aspirin EC 81 MG tablet Take 81 mg by mouth daily.    Marland Kitchen CALCIUM PO Take by mouth.    . Cholecalciferol (VITAMIN D) 2000 units CAPS Take by mouth.    . cyanocobalamin 500 MCG tablet Take 500 mcg by mouth daily.    Marland Kitchen ezetimibe (ZETIA) 10 MG tablet TAKE 1 TABLET BY MOUTH EVERY DAY 90 tablet 1  . famotidine (PEPCID) 20 MG tablet Take 20 mg by mouth daily as needed for heartburn or indigestion.    . hydrOXYzine (ATARAX/VISTARIL) 10 MG tablet Take 1-2 tablets (10-20 mg total) by mouth 2 (two) times daily as needed for anxiety. 30 tablet 0  . loperamide (IMODIUM) 2 MG capsule Taking 1/2 capsule by mouth at bedtime    . metoprolol succinate (TOPROL-XL) 50 MG 24 hr tablet TAKE 1 TABLET (50 MG TOTAL) BY MOUTH DAILY. TAKE WITH OR IMMEDIATELY FOLLOWING A MEAL. 90 tablet 1  . Omega-3 Fatty Acids (FISH OIL PO) Take 1,200 mg by mouth 2 (two) times daily.    . valsartan-hydrochlorothiazide (DIOVAN-HCT) 80-12.5 MG tablet TAKE 1 TABLET BY MOUTH EVERY DAY 90 tablet 1   No current  facility-administered medications on file prior to visit.    BP 130/70   Pulse 67   Wt 166 lb (75.3 kg)   BMI 29.41 kg/m    Objective:   Physical Exam  Constitutional: She is oriented to person, place, and time. She appears well-nourished.  Respiratory: Effort normal.  Neurological: She is alert and oriented to person, place, and time.  Psychiatric: She has a normal mood and affect.           Assessment & Plan:

## 2019-08-06 NOTE — Assessment & Plan Note (Signed)
Improvement in LDL to 142, commended her on dietary changes. Trigs are up, she has recently resumed Fish Oil.  Repeat labs in 6 months.

## 2019-08-06 NOTE — Assessment & Plan Note (Signed)
A1C on recent labs about the same, she is motivated to work on diet and exercise. Repeat in 6 months.

## 2019-09-06 ENCOUNTER — Ambulatory Visit (INDEPENDENT_AMBULATORY_CARE_PROVIDER_SITE_OTHER): Payer: Medicare Other | Admitting: Gastroenterology

## 2019-09-06 ENCOUNTER — Other Ambulatory Visit: Payer: Self-pay

## 2019-09-06 ENCOUNTER — Telehealth: Payer: Self-pay

## 2019-09-06 ENCOUNTER — Encounter: Payer: Self-pay | Admitting: Gastroenterology

## 2019-09-06 VITALS — BP 146/82 | HR 71 | Temp 98.2°F | Ht 63.0 in | Wt 170.0 lb

## 2019-09-06 DIAGNOSIS — K219 Gastro-esophageal reflux disease without esophagitis: Secondary | ICD-10-CM

## 2019-09-06 DIAGNOSIS — K760 Fatty (change of) liver, not elsewhere classified: Secondary | ICD-10-CM | POA: Diagnosis not present

## 2019-09-06 DIAGNOSIS — G8929 Other chronic pain: Secondary | ICD-10-CM | POA: Diagnosis not present

## 2019-09-06 DIAGNOSIS — K580 Irritable bowel syndrome with diarrhea: Secondary | ICD-10-CM

## 2019-09-06 DIAGNOSIS — R1013 Epigastric pain: Secondary | ICD-10-CM | POA: Diagnosis not present

## 2019-09-06 DIAGNOSIS — Z1211 Encounter for screening for malignant neoplasm of colon: Secondary | ICD-10-CM | POA: Diagnosis not present

## 2019-09-06 MED ORDER — PEG 3350-KCL-NA BICARB-NACL 420 G PO SOLR: 4000.0000 mL | Freq: Once | ORAL | 0 refills | Status: AC

## 2019-09-06 MED ORDER — SUTAB 1479-225-188 MG PO TABS: 12.0000 | ORAL_TABLET | Freq: Two times a day (BID) | ORAL | 0 refills | Status: DC

## 2019-09-06 NOTE — Progress Notes (Addendum)
Cephas Darby, MD [address]  [place]  [place], [place] [address]  Main: [phone]  Fax: [phone]    Gastroenterology Consultation  Referring Provider:     [name], NP Primary Care Physician:  [name], NP Primary Gastroenterologist:  Dr. Cephas Darby Reason for Consultation: IBS, NASH-fibrosis        HPI:   Amy Wilcox is a 69 y.o. Caucasian female with metabolic syndrome referred by Dr. Carlis Abbott, Leticia Penna, NP  for consultation & management of chronic diarrhea. She describes sporadic episodes of non bloody diarrhea, approximately 5/day a/w abdominal cramps. Stools are of variable consistency. Certain foods like raw onions, cream cheese, spicy foods, raw fruits and vegetables, a/w bloating. These occur approximately upto 3 times/month. Also, has constant upper abdominal discomfort, and epigastric burning, on pepcid as needed. Takes imodium daily at bedtime. The symptoms have been chronic and occurring for the past 10 years. She underwent colonoscopy in [place] in 2009 and was normal. She reports that her symptoms got worse and her last colonoscopy and would prefer not to undergo another colonoscopy. Random biopsies was not performed at that time. She does not recall if she was tested for celiac disease or H. pylori infection. She did not have EGD in the past. She is not losing weight. She denies nausea or vomiting, use of antibiotics, sick contacts or recent travel. She is retired and moved along with her husband from [place] about 10 years ago. She has elderly parents and has to travel frequently to [place] which she finds it quite stressful.  She reports that she is trying to eat healthy food.  She is also found to have mildly elevated transaminases since 02/2017. She also has hyperlipidemia and unable to tolerate statins. He is currently on ezetimibe. HCV antibody negative. She denies alcohol use or IV drug abuse  Follow-up visit 12/31/2017 Since  last visit, patient underwent secondary liver disease workup which came back negative except for fatty liver and ultrasound elastography revealed moderate degree of fibrosis. She feels that her IBS symptoms have responded to VSL #3 but could not find it over-the-counter. her insurance did not approve amitriptyline. I switched to Zoloft and she took only one dose which caused headache. She is very anxious about her ultrasound results of the liver.she is here today to discuss about ultrasound results. Her husband is with her today. She wants to know if fatty liver and IBS are connected She is taking artificial sweeteners  Follow-up visit 04/03/2018 She reports that her IBS symptoms are manageable. She tried amitriptyline 50 mg at bedtime which resulted in drowsiness all day next day.  She also tried IBgard which did not provide any symptom relief.  She ordered VSL#3 online, she reports that she is watching what she is eating. She did not lose any weight since last visit.  She denies any other complaints, otherwise  Follow-up visit 09/06/2019 Patient continues to have ongoing loose bowel movements for which she takes half a pill of Imodium at bedtime daily.  She had 2 flareups in the last few months that have lasted for about 2 days.  She is interested to undergo colonoscopy for colon cancer screening.  She is also concerned about ongoing heartburn, epigastric discomfort.  She takes Pepcid as needed which does provide some relief.   NSAIDs: None  Antiplts/Anticoagulants/Anti thrombotics: None  GI Procedures: Colonoscopy in 2009 in [place], normal, biopsies were not performed She denies family history of  GI malignancy, inflammatory bowel disease or celiac disease  Past Medical History:  Diagnosis Date  . Essential hypertension   . Fibrocystic breast   . Hyperlipidemia   . IBS (irritable bowel syndrome)   . Idiopathic hematuria   . OSA (obstructive sleep apnea)     Past Surgical History:    Procedure Laterality Date  . TONSILLECTOMY AND ADENOIDECTOMY  1961    Current Outpatient Medications:  .  aspirin EC 81 MG tablet, Take 81 mg by mouth daily., Disp: , Rfl:  .  CALCIUM PO, Take by mouth., Disp: , Rfl:  .  Cholecalciferol (VITAMIN D) 2000 units CAPS, Take by mouth., Disp: , Rfl:  .  cyanocobalamin 500 MCG tablet, Take 500 mcg by mouth daily., Disp: , Rfl:  .  ezetimibe (ZETIA) 10 MG tablet, TAKE 1 TABLET BY MOUTH EVERY DAY, Disp: 90 tablet, Rfl: 1 .  famotidine (PEPCID) 20 MG tablet, Take 20 mg by mouth daily as needed for heartburn or indigestion., Disp: , Rfl:  .  hydrOXYzine (ATARAX/VISTARIL) 10 MG tablet, Take 1-2 tablets (10-20 mg total) by mouth 2 (two) times daily as needed for anxiety., Disp: 30 tablet, Rfl: 0 .  loperamide (IMODIUM) 2 MG capsule, Taking 1/2 capsule by mouth at bedtime, Disp: , Rfl:  .  metoprolol succinate (TOPROL-XL) 50 MG 24 hr tablet, TAKE 1 TABLET (50 MG TOTAL) BY MOUTH DAILY. TAKE WITH OR IMMEDIATELY FOLLOWING A MEAL., Disp: 90 tablet, Rfl: 1 .  Omega-3 Fatty Acids (FISH OIL PO), Take 1,200 mg by mouth 2 (two) times daily., Disp: , Rfl:  .  valsartan-hydrochlorothiazide (DIOVAN-HCT) 80-12.5 MG tablet, TAKE 1 TABLET BY MOUTH EVERY DAY, Disp: 90 tablet, Rfl: 1 .  polyethylene glycol-electrolytes (NULYTELY) 420 g solution, Take 4,000 mLs by mouth once for 1 dose., Disp: 4000 mL, Rfl: 0 .  Sodium Sulfate-Mag Sulfate-KCl (SUTAB) [phone] MG TABS, Take 12 tablets by mouth in the morning and at bedtime. At 5pm take 12 tablets and then 5 hours prior to colonoscopy take the other 12., Disp: 24 tablet, Rfl: 0    Family History  Problem Relation Age of Onset  . Hypertension Mother   . Hyperlipidemia Mother   . Alzheimer's disease Mother   . Heart disease Father   . Heart attack Father   . Hyperlipidemia Father   . Hypertension Father      Social History   Tobacco Use  . Smoking status: Never Smoker  . Smokeless tobacco: Never Used   Substance Use Topics  . Alcohol use: No  . Drug use: No    Allergies as of 09/06/2019 - Review Complete 09/06/2019  Allergen Reaction Noted  . Livalo [pitavastatin]  02/20/2017  . Statins  02/20/2017    Review of Systems:    All systems reviewed and negative except where noted in HPI.   Physical Exam:  BP (!) 146/82 (BP Location: Left Arm, Patient Position: Sitting, Cuff Size: Normal)   Pulse 71   Temp 98.2 F (36.8 C) (Oral)   Ht 5\' 3"  (1.6 m)   Wt 170 lb (77.1 kg)   BMI 30.11 kg/m  No LMP recorded. Patient is postmenopausal.  General:   Alert,  Well-developed, well-nourished, pleasant and cooperative in NAD Head:  Normocephalic and atraumatic. Eyes:  Sclera clear, no icterus.   Conjunctiva pink. Ears:  Normal auditory acuity. Nose:  No deformity, discharge, or lesions. Mouth:  No deformity or lesions,oropharynx pink & moist. Neck:  Supple; no masses or thyromegaly. Lungs:  Respirations even and unlabored.  Clear throughout to auscultation.   No wheezes, crackles, or rhonchi. No acute distress. Heart:  Regular rate and rhythm; no murmurs, clicks, rubs, or gallops. Abdomen:  Normal bowel sounds. Soft, non-tender and mildly distended, tympanic without masses, hepatosplenomegaly or hernias noted.  No guarding or rebound tenderness.   Rectal: Not performed Msk:  Symmetrical without gross deformities. Good, equal movement & strength bilaterally. Pulses:  Normal pulses noted. Extremities:  No clubbing or edema.  No cyanosis. Neurologic:  Alert and oriented x3;  grossly normal neurologically. Skin:  Intact without significant lesions or rashes. No jaundice. Psych:  Alert and cooperative. Normal mood and affect.  Imaging Studies: No abdominal imaging available  Assessment and Plan:   Amy Wilcox is a 69 y.o.  White female with metabolic syndrome, [place] fibrosis, diarrhea predominant IBS here for follow-up.  Diarrhea predominant IBS.   GI pathogen panel, stool for C.  difficile, H. pylori stool antigen test negative   celiac serologies and CRP, fecal calprotectin came back normal Colonoscopy with colon biopsies to evaluate for microscopic colitis given her age EGD with duodenal biopsies as patient is on valsartan  Colon cancer screening: Last colonoscopy in 2009, unremarkable Cologuard in 2018 negative Schedule colonoscopy for colon cancer screening  Elevated LFTs: secondary to fatty liver with moderate degree of fibrosis based on ultrasound elastography. secondary liver disease workup unremarkable Her LFTs are mildly elevated, stable Discussed previously in length with her about fatty liver, its progression and management Reassured her that this can be irreversible by following healthy diet and exercise and weight loss Monitor LFTs every 6 months, recheck today Recommend NASH fibrosure  Chronic GERD and epigastric pain Recommend EGD for further evaluation Continue Pepcid for now   Follow up in 6 months   Cephas Darby, MD

## 2019-09-06 NOTE — Telephone Encounter (Signed)
Patient pharmacy does not cover the Sutab. Called in alternative for patient

## 2019-09-07 NOTE — Telephone Encounter (Signed)
Called and informed patient. Patient states she really wants to try the tablets. Informed patient I would give her samples. Placed samples up front

## 2019-09-08 DIAGNOSIS — G4733 Obstructive sleep apnea (adult) (pediatric): Secondary | ICD-10-CM | POA: Diagnosis not present

## 2019-09-08 LAB — NASH FIBROSURE
ALPHA 2-MACROGLOBULINS, QN: 372 mg/dL — ABNORMAL HIGH (ref 110–276)
ALT (SGPT) P5P: 39 IU/L (ref 0–40)
AST (SGOT) P5P: 36 IU/L (ref 0–40)
Apolipoprotein A-1: 158 mg/dL (ref 116–209)
Bilirubin, Total: 0.3 mg/dL (ref 0.0–1.2)
Cholesterol, Total: 269 mg/dL — ABNORMAL HIGH (ref 100–199)
Fibrosis Score: 0.35 — ABNORMAL HIGH (ref 0.00–0.21)
GGT: 25 IU/L (ref 0–60)
Glucose: 107 mg/dL — ABNORMAL HIGH (ref 65–99)
Haptoglobin: 178 mg/dL (ref 37–355)
Height: 62 in
NASH Score: 0.75 — ABNORMAL HIGH
Steatosis Score: 0.63 — ABNORMAL HIGH (ref 0.00–0.30)
Triglycerides: 254 mg/dL — ABNORMAL HIGH (ref 0–149)
Weight: 170 [lb_av]

## 2019-09-08 LAB — HEPATIC FUNCTION PANEL
ALT: 34 IU/L — ABNORMAL HIGH (ref 0–32)
AST: 28 IU/L (ref 0–40)
Albumin: 5 g/dL — ABNORMAL HIGH (ref 3.8–4.8)
Alkaline Phosphatase: 38 IU/L — ABNORMAL LOW (ref 39–117)
Bilirubin Total: 0.5 mg/dL (ref 0.0–1.2)
Bilirubin, Direct: 0.14 mg/dL (ref 0.00–0.40)
Total Protein: 7.3 g/dL (ref 6.0–8.5)

## 2019-09-20 DIAGNOSIS — H16223 Keratoconjunctivitis sicca, not specified as Sjogren's, bilateral: Secondary | ICD-10-CM | POA: Diagnosis not present

## 2019-09-20 DIAGNOSIS — H02882 Meibomian gland dysfunction right lower eyelid: Secondary | ICD-10-CM | POA: Diagnosis not present

## 2019-09-20 DIAGNOSIS — H02885 Meibomian gland dysfunction left lower eyelid: Secondary | ICD-10-CM | POA: Diagnosis not present

## 2019-09-23 ENCOUNTER — Other Ambulatory Visit
Admission: RE | Admit: 2019-09-23 | Discharge: 2019-09-23 | Disposition: A | Discharge status: Home / Self Care | Payer: Medicare Other | Source: Ambulatory Visit | Attending: Gastroenterology | Admitting: Gastroenterology

## 2019-09-23 DIAGNOSIS — Z01812 Encounter for preprocedural laboratory examination: Secondary | ICD-10-CM | POA: Insufficient documentation

## 2019-09-23 DIAGNOSIS — Z20822 Contact with and (suspected) exposure to covid-19: Secondary | ICD-10-CM | POA: Diagnosis not present

## 2019-09-23 LAB — SARS CORONAVIRUS 2 (TAT 6-24 HRS): SARS Coronavirus 2: NEGATIVE

## 2019-09-27 ENCOUNTER — Other Ambulatory Visit: Payer: Self-pay

## 2019-09-27 ENCOUNTER — Ambulatory Visit: Payer: Medicare Other | Admitting: Anesthesiology

## 2019-09-27 ENCOUNTER — Encounter
Admission: RE | Disposition: A | Discharge status: Home / Self Care | Payer: Self-pay | Source: Home / Self Care | Attending: Gastroenterology

## 2019-09-27 ENCOUNTER — Ambulatory Visit
Admission: RE | Admit: 2019-09-27 | Discharge: 2019-09-27 | Disposition: A | Discharge status: Home / Self Care | Payer: Medicare Other | Attending: Gastroenterology | Admitting: Gastroenterology

## 2019-09-27 DIAGNOSIS — G4733 Obstructive sleep apnea (adult) (pediatric): Secondary | ICD-10-CM | POA: Insufficient documentation

## 2019-09-27 DIAGNOSIS — K2951 Unspecified chronic gastritis with bleeding: Secondary | ICD-10-CM | POA: Diagnosis not present

## 2019-09-27 DIAGNOSIS — K219 Gastro-esophageal reflux disease without esophagitis: Secondary | ICD-10-CM | POA: Diagnosis not present

## 2019-09-27 DIAGNOSIS — Z1211 Encounter for screening for malignant neoplasm of colon: Secondary | ICD-10-CM | POA: Diagnosis not present

## 2019-09-27 DIAGNOSIS — I10 Essential (primary) hypertension: Secondary | ICD-10-CM | POA: Diagnosis not present

## 2019-09-27 DIAGNOSIS — Z888 Allergy status to other drugs, medicaments and biological substances status: Secondary | ICD-10-CM | POA: Insufficient documentation

## 2019-09-27 DIAGNOSIS — Z8349 Family history of other endocrine, nutritional and metabolic diseases: Secondary | ICD-10-CM | POA: Insufficient documentation

## 2019-09-27 DIAGNOSIS — Z7982 Long term (current) use of aspirin: Secondary | ICD-10-CM | POA: Diagnosis not present

## 2019-09-27 DIAGNOSIS — K253 Acute gastric ulcer without hemorrhage or perforation: Secondary | ICD-10-CM | POA: Diagnosis not present

## 2019-09-27 DIAGNOSIS — K2950 Unspecified chronic gastritis without bleeding: Secondary | ICD-10-CM | POA: Diagnosis not present

## 2019-09-27 DIAGNOSIS — K319 Disease of stomach and duodenum, unspecified: Secondary | ICD-10-CM | POA: Insufficient documentation

## 2019-09-27 DIAGNOSIS — K2100 Gastro-esophageal reflux disease with esophagitis, without bleeding: Secondary | ICD-10-CM | POA: Diagnosis not present

## 2019-09-27 DIAGNOSIS — E785 Hyperlipidemia, unspecified: Secondary | ICD-10-CM | POA: Diagnosis not present

## 2019-09-27 DIAGNOSIS — Z79899 Other long term (current) drug therapy: Secondary | ICD-10-CM | POA: Insufficient documentation

## 2019-09-27 DIAGNOSIS — K589 Irritable bowel syndrome without diarrhea: Secondary | ICD-10-CM | POA: Insufficient documentation

## 2019-09-27 DIAGNOSIS — Z8249 Family history of ischemic heart disease and other diseases of the circulatory system: Secondary | ICD-10-CM | POA: Insufficient documentation

## 2019-09-27 DIAGNOSIS — R1013 Epigastric pain: Secondary | ICD-10-CM | POA: Diagnosis present

## 2019-09-27 DIAGNOSIS — K259 Gastric ulcer, unspecified as acute or chronic, without hemorrhage or perforation: Secondary | ICD-10-CM | POA: Diagnosis not present

## 2019-09-27 HISTORY — PX: COLONOSCOPY WITH PROPOFOL: SHX5780

## 2019-09-27 HISTORY — PX: ESOPHAGOGASTRODUODENOSCOPY (EGD) WITH PROPOFOL: SHX5813

## 2019-09-27 SURGERY — COLONOSCOPY WITH PROPOFOL
Anesthesia: General

## 2019-09-27 MED ORDER — LIDOCAINE HCL (CARDIAC) PF 100 MG/5ML IV SOSY
PREFILLED_SYRINGE | INTRAVENOUS | Status: DC | PRN
  Administered 2019-09-27: 40 mg via INTRAVENOUS

## 2019-09-27 MED ORDER — EPHEDRINE SULFATE 50 MG/ML IJ SOLN
INTRAMUSCULAR | Status: DC | PRN
  Administered 2019-09-27: 10 mg via INTRAVENOUS

## 2019-09-27 MED ORDER — OMEPRAZOLE 40 MG PO CPDR: 40.0000 mg | DELAYED_RELEASE_CAPSULE | Freq: Two times a day (BID) | ORAL | 2 refills | Status: DC

## 2019-09-27 MED ORDER — MIDAZOLAM HCL 2 MG/2ML IJ SOLN
INTRAMUSCULAR | Status: DC | PRN
  Administered 2019-09-27: 2 mg via INTRAVENOUS

## 2019-09-27 MED ORDER — MIDAZOLAM HCL 2 MG/2ML IJ SOLN
INTRAMUSCULAR | Status: AC
  Filled 2019-09-27: qty 2

## 2019-09-27 MED ORDER — PROPOFOL 10 MG/ML IV BOLUS
INTRAVENOUS | Status: DC | PRN
  Administered 2019-09-27: 80 mg via INTRAVENOUS
  Administered 2019-09-27: 10 mg via INTRAVENOUS

## 2019-09-27 MED ORDER — SODIUM CHLORIDE 0.9 % IV SOLN
INTRAVENOUS | Status: DC
  Administered 2019-09-27: 1000 mL via INTRAVENOUS

## 2019-09-27 MED ORDER — PROPOFOL 500 MG/50ML IV EMUL
INTRAVENOUS | Status: DC | PRN
  Administered 2019-09-27: 150 ug/kg/min via INTRAVENOUS

## 2019-09-27 MED ORDER — PROPOFOL 500 MG/50ML IV EMUL
INTRAVENOUS | Status: AC
  Filled 2019-09-27: qty 50

## 2019-09-27 MED ORDER — GLYCOPYRROLATE 0.2 MG/ML IJ SOLN
INTRAMUSCULAR | Status: DC | PRN
  Administered 2019-09-27: .2 mg via INTRAVENOUS

## 2019-09-27 NOTE — H&P (Signed)
Cephas Darby, MD [address]  [place]  [place], [place] [address]  Main: [phone]  Fax: [phone] Pager: [phone]  Primary Care Physician:  [name], NP Primary Gastroenterologist:  Dr. Cephas Darby  Pre-Procedure History & Physical: HPI:  Amy Wilcox is a 69 y.o. female is here for an endoscopy and colonoscopy.   Past Medical History:  Diagnosis Date  . Essential hypertension   . Fibrocystic breast   . Hyperlipidemia   . IBS (irritable bowel syndrome)   . Idiopathic hematuria   . OSA (obstructive sleep apnea)     Past Surgical History:  Procedure Laterality Date  . TONSILLECTOMY AND ADENOIDECTOMY  1961    Prior to Admission medications   Medication Sig Start Date End Date Taking? Authorizing Provider  aspirin EC 81 MG tablet Take 81 mg by mouth daily.   Yes [provider]  CALCIUM PO Take by mouth.   Yes [provider]  Cholecalciferol (VITAMIN D) 2000 units CAPS Take by mouth.   Yes [provider]  cyanocobalamin 500 MCG tablet Take 500 mcg by mouth daily.   Yes [provider]  ezetimibe (ZETIA) 10 MG tablet TAKE 1 TABLET BY MOUTH EVERY DAY 04/13/19  Yes [name], NP  famotidine (PEPCID) 20 MG tablet Take 20 mg by mouth daily as needed for heartburn or indigestion.   Yes [provider]  hydrOXYzine (ATARAX/VISTARIL) 10 MG tablet Take 1-2 tablets (10-20 mg total) by mouth 2 (two) times daily as needed for anxiety. 06/21/19  Yes [name], NP  loperamide (IMODIUM) 2 MG capsule Taking 1/2 capsule by mouth at bedtime   Yes [provider]  metoprolol succinate (TOPROL-XL) 50 MG 24 hr tablet TAKE 1 TABLET (50 MG TOTAL) BY MOUTH DAILY. TAKE WITH OR IMMEDIATELY FOLLOWING A MEAL. 04/13/19  Yes [name], NP  Omega-3 Fatty Acids (FISH OIL PO) Take 1,200 mg by mouth 2 (two) times daily.   Yes [provider]  Sodium Sulfate-Mag Sulfate-KCl (SUTAB)  [id number] MG TABS Take 12 tablets by mouth in the morning and at bedtime. At 5pm take 12 tablets and then 5 hours prior to colonoscopy take the other 12. 09/06/19  Yes Danajah Birdsell, Tally Due, MD  valsartan-hydrochlorothiazide (DIOVAN-HCT) 80-12.5 MG tablet TAKE 1 TABLET BY MOUTH EVERY DAY 04/08/19  Yes [name], NP    Allergies as of 09/07/2019 - Review Complete 09/06/2019  Allergen Reaction Noted  . Livalo [pitavastatin]  02/20/2017  . Statins  02/20/2017    Family History  Problem Relation Age of Onset  . Hypertension Mother   . Hyperlipidemia Mother   . Alzheimer's disease Mother   . Heart disease Father   . Heart attack Father   . Hyperlipidemia Father   . Hypertension Father     Social History   Socioeconomic History  . Marital status: Married    Spouse name: Not on file  . Number of children: Not on file  . Years of education: Not on file  . Highest education level: Not on file  Occupational History  . Occupation: retired  Tobacco Use  . Smoking status: Never Smoker  . Smokeless tobacco: Never Used  Substance and Sexual Activity  . Alcohol use: No  . Drug use: No  . Sexual activity: Yes  Other Topics Concern  . Not on file  Social History Narrative   Married.   3 children.   Retired. Once worked a Agricultural engineer.  Enjoys spending time with family.   Social Determinants of Health   Financial Resource Strain: Low Risk   . Difficulty of Paying Living Expenses: Not hard at all  Food Insecurity: No Food Insecurity  . Worried About Charity fundraiser in the Last Year: Never true  . Ran Out of Food in the Last Year: Never true  Transportation Needs: No Transportation Needs  . Lack of Transportation (Medical): No  . Lack of Transportation (Non-Medical): No  Physical Activity: Inactive  . Days of Exercise per Week: 0 days  . Minutes of Exercise per Session: 0 min  Stress: No Stress Concern Present  . Feeling of Stress : Not at all  Social Connections:     . Frequency of Communication with Friends and Family:   . Frequency of Social Gatherings with Friends and Family:   . Attends Religious Services:   . Active Member of Clubs or Organizations:   . Attends Archivist Meetings:   Marland Kitchen Marital Status:   Intimate Partner Violence: Not At Risk  . Fear of Current or Ex-Partner: No  . Emotionally Abused: No  . Physically Abused: No  . Sexually Abused: No    Review of Systems: See HPI, otherwise negative ROS  Physical Exam: BP (!) 145/87   Pulse 92   Temp (!) 96.1 F (35.6 C) (Temporal)   Resp 20   Ht 5\' 2"  (1.575 m)   Wt 75.3 kg   SpO2 100%   BMI 30.36 kg/m  General:   Alert,  pleasant and cooperative in NAD Head:  Normocephalic and atraumatic. Neck:  Supple; no masses or thyromegaly. Lungs:  Clear throughout to auscultation.    Heart:  Regular rate and rhythm. Abdomen:  Soft, nontender and nondistended. Normal bowel sounds, without guarding, and without rebound.   Neurologic:  Alert and  oriented x4;  grossly normal neurologically.  Impression/Plan: Amy Wilcox is here for an endoscopy and colonoscopy to be performed for colon cancer screening and GERD, epigastric pain  Risks, benefits, limitations, and alternatives regarding  endoscopy and colonoscopy have been reviewed with the patient.  Questions have been answered.  All parties agreeable.   Sherri Sear, MD  09/27/2019, 8:15 AM

## 2019-09-27 NOTE — Op Note (Signed)
[facility] Gastroenterology Patient Name: Amy Wilcox Procedure Date: 09/27/2019 8:33 AM MRN: [id number] Account #: [id number] Date of Birth: 28-Nov-1950 Admit Type: Outpatient Age: 69 Room: [facility] ENDO ROOM 1 Gender: Female Note Status: Finalized Procedure:             Colonoscopy Indications:           Screening for colorectal malignant neoplasm Providers:             Lin Landsman MD, MD Medicines:             Monitored Anesthesia Care Complications:         No immediate complications. Estimated blood loss: None. Procedure:             Pre-Anesthesia Assessment:                        - Prior to the procedure, a History and Physical was                         performed, and patient medications and allergies were                         reviewed. The patient is competent. The risks and                         benefits of the procedure and the sedation options and                         risks were discussed with the patient. All questions                         were answered and informed consent was obtained.                         Patient identification and proposed procedure were                         verified by the physician, the nurse, the                         anesthesiologist, the anesthetist and the technician                         in the pre-procedure area in the procedure room in the                         endoscopy suite. Mental Status Examination: alert and                         oriented. Airway Examination: normal oropharyngeal                         airway and neck mobility. Respiratory Examination:                         clear to auscultation. CV Examination: normal.                         Prophylactic Antibiotics: The patient does not  require                         prophylactic antibiotics. Prior Anticoagulants: The                         patient has taken no previous anticoagulant or                         antiplatelet agents.  ASA Grade Assessment: II - A                         patient with mild systemic disease. After reviewing                         the risks and benefits, the patient was deemed in                         satisfactory condition to undergo the procedure. The                         anesthesia plan was to use monitored anesthesia care                         (MAC). Immediately prior to administration of                         medications, the patient was re-assessed for adequacy                         to receive sedatives. The heart rate, respiratory                         rate, oxygen saturations, blood pressure, adequacy of                         pulmonary ventilation, and response to care were                         monitored throughout the procedure. The physical                         status of the patient was re-assessed after the                         procedure.                        After obtaining informed consent, the colonoscope was                         passed under direct vision. Throughout the procedure,                         the patient's blood pressure, pulse, and oxygen                         saturations were monitored continuously. The  Colonoscope was introduced through the anus and                         advanced to the the terminal ileum, with                         identification of the appendiceal orifice and IC                         valve. The colonoscopy was performed without                         difficulty. The patient tolerated the procedure well.                         The quality of the bowel preparation was evaluated                         using the BBPS [facility] Bowel Preparation Scale) with                         scores of: Right Colon = 3, Transverse Colon = 3 and                         Left Colon = 3 (entire mucosa seen well with no                         residual staining, small fragments of stool or opaque                          liquid). The total BBPS score equals 9. Findings:      The perianal and digital rectal examinations were normal. Pertinent       negatives include normal sphincter tone and no palpable rectal lesions.      The terminal ileum appeared normal.      The entire examined colon appeared normal.      The retroflexed view of the distal rectum and anal verge was normal and       showed no anal or rectal abnormalities. Impression:            - The examined portion of the ileum was normal.                        - The entire examined colon is normal.                        - The distal rectum and anal verge are normal on                         retroflexion view.                        - No specimens collected. Recommendation:        - Discharge patient to home (with escort).                        - Resume previous diet today.                        -  Continue present medications.                        - Repeat colonoscopy in 10 years for surveillance. Procedure Code(s):     --- Professional ---                        I1030, Colorectal cancer screening; colonoscopy on                         individual not meeting criteria for high risk Diagnosis Code(s):     --- Professional ---                        Z12.11, Encounter for screening for malignant neoplasm                         of colon CPT copyright 2019 American Medical Association. All rights reserved. The codes documented in this report are preliminary and upon coder review may  be revised to meet current compliance requirements. Dr. Ulyess Mort Lin Landsman MD, MD 09/27/2019 9:15:05 AM This report has been signed electronically. Number of Addenda: 0 Note Initiated On: 09/27/2019 8:33 AM Scope Withdrawal Time: 0 hours 8 minutes 25 seconds  Total Procedure Duration: 0 hours 11 minutes 45 seconds  Estimated Blood Loss:  Estimated blood loss: none.      [facility]

## 2019-09-27 NOTE — Anesthesia Postprocedure Evaluation (Signed)
Anesthesia Post Note  Patient: Amy Wilcox  Procedure(s) Performed: COLONOSCOPY WITH PROPOFOL (N/A ) ESOPHAGOGASTRODUODENOSCOPY (EGD) WITH PROPOFOL (N/A )  Patient location during evaluation: Endoscopy Anesthesia Type: General Level of consciousness: awake and alert Pain management: pain level controlled Vital Signs Assessment: post-procedure vital signs reviewed and stable Respiratory status: spontaneous breathing, nonlabored ventilation, respiratory function stable and patient connected to nasal cannula oxygen Cardiovascular status: blood pressure returned to baseline and stable Postop Assessment: no apparent nausea or vomiting Anesthetic complications: no     Last Vitals:  Vitals:   09/27/19 0927 09/27/19 0937  BP: (!) 109/53 (!) 113/49  Pulse: 87 81  Resp: 18 16  Temp:    SpO2: 100% 98%    Last Pain:  Vitals:   09/27/19 0937  TempSrc:   PainSc: 0-No pain                 Arita Miss

## 2019-09-27 NOTE — Anesthesia Procedure Notes (Signed)
Date/Time: 09/27/2019 8:44 AM Performed by: Doreen Salvage, CRNA Pre-anesthesia Checklist: Patient identified, Emergency Drugs available, Suction available and Patient being monitored Patient Re-evaluated:Patient Re-evaluated prior to induction Oxygen Delivery Method: Nasal cannula Induction Type: IV induction Dental Injury: Teeth and Oropharynx as per pre-operative assessment  Comments: Nasal cannula with etCO2 monitoring

## 2019-09-27 NOTE — Transfer of Care (Signed)
Immediate Anesthesia Transfer of Care Note  Patient: Amy Wilcox  Procedure(s) Performed: Procedure(s): COLONOSCOPY WITH PROPOFOL (N/A) ESOPHAGOGASTRODUODENOSCOPY (EGD) WITH PROPOFOL (N/A)  Patient Location: PACU and Endoscopy Unit  Anesthesia Type:General  Level of Consciousness: sedated  Airway & Oxygen Therapy: Patient Spontanous Breathing and Patient connected to nasal cannula oxygen  Post-op Assessment: Report given to RN and Post -op Vital signs reviewed and stable  Post vital signs: Reviewed and stable  Last Vitals:  Vitals:   09/27/19 0808 09/27/19 0917  BP: (!) 145/87 (!) 102/51  Pulse: 92 93  Resp: 20 18  Temp: (!) 35.6 C 36.4 C  SpO2: [id number]    Complications: No apparent anesthesia complications

## 2019-09-27 NOTE — Anesthesia Preprocedure Evaluation (Signed)
Anesthesia Evaluation  Patient identified by MRN, date of birth, ID band Patient awake    Reviewed: Allergy & Precautions, NPO status , Patient's Chart, lab work & pertinent test results  History of Anesthesia Complications Negative for: history of anesthetic complications  Airway Mallampati: II  TM Distance: >3 FB Neck ROM: Full    Dental no notable dental hx. (+) Teeth Intact   Pulmonary sleep apnea and Continuous Positive Airway Pressure Ventilation , neg COPD, Patient abstained from smoking.Not current smoker,    Pulmonary exam normal breath sounds clear to auscultation       Cardiovascular Exercise Tolerance: Good METShypertension, Pt. on medications (-) CAD and (-) Past MI negative cardio ROS  (-) dysrhythmias  Rhythm:Regular Rate:Normal - Systolic murmurs    Neuro/Psych PSYCHIATRIC DISORDERS Anxiety negative neurological ROS     GI/Hepatic neg GERD  ,(+)     (-) substance abuse  ,   Endo/Other  neg diabetes  Renal/GU negative Renal ROS     Musculoskeletal   Abdominal   Peds  Hematology   Anesthesia Other Findings Past Medical History: No date: Essential hypertension No date: Fibrocystic breast No date: Hyperlipidemia No date: IBS (irritable bowel syndrome) No date: Idiopathic hematuria No date: OSA (obstructive sleep apnea)  Reproductive/Obstetrics                             Anesthesia Physical Anesthesia Plan  ASA: II  Anesthesia Plan: General   Post-op Pain Management:    Induction: Intravenous  PONV Risk Score and Plan: 3 and Ondansetron, Propofol infusion and TIVA  Airway Management Planned: Nasal Cannula  Additional Equipment: None  Intra-op Plan:   Post-operative Plan:   Informed Consent: I have reviewed the patients History and Physical, chart, labs and discussed the procedure including the risks, benefits and alternatives for the proposed anesthesia  with the patient or authorized representative who has indicated his/her understanding and acceptance.     Dental advisory given  Plan Discussed with: CRNA and Surgeon  Anesthesia Plan Comments: (Discussed risks of anesthesia with patient, including possibility of difficulty with spontaneous ventilation under anesthesia necessitating airway intervention, PONV, and rare risks such as cardiac or respiratory or neurological events. Patient understands.)        Anesthesia Quick Evaluation

## 2019-09-27 NOTE — Op Note (Signed)
[facility] Gastroenterology Patient Name: Amy Wilcox Procedure Date: 09/27/2019 8:34 AM MRN: [id number] Account #: [id number] Date of Birth: [birth year] Admit Type: Outpatient Age: 69 Room: [facility] ENDO ROOM 1 Gender: Female Note Status: Finalized Procedure:             Upper GI endoscopy Indications:           Epigastric abdominal pain, Heartburn Providers:             Lin Landsman MD, MD Referring MD:          [name] (Referring MD) Medicines:             Monitored Anesthesia Care Complications:         No immediate complications. Estimated blood loss: None. Procedure:             Pre-Anesthesia Assessment:                        - Prior to the procedure, a History and Physical was                         performed, and patient medications and allergies were                         reviewed. The patient is competent. The risks and                         benefits of the procedure and the sedation options and                         risks were discussed with the patient. All questions                         were answered and informed consent was obtained.                         Patient identification and proposed procedure were                         verified by the physician, the nurse, the                         anesthesiologist, the anesthetist and the technician                         in the pre-procedure area in the procedure room in the                         endoscopy suite. Mental Status Examination: alert and                         oriented. Airway Examination: normal oropharyngeal                         airway and neck mobility. Respiratory Examination:                         clear to auscultation. CV Examination: normal.  Prophylactic Antibiotics: The patient does not require                         prophylactic antibiotics. Prior Anticoagulants: The                         patient has taken no previous  anticoagulant or                         antiplatelet agents. ASA Grade Assessment: II - A                         patient with mild systemic disease. After reviewing                         the risks and benefits, the patient was deemed in                         satisfactory condition to undergo the procedure. The                         anesthesia plan was to use monitored anesthesia care                         (MAC). Immediately prior to administration of                         medications, the patient was re-assessed for adequacy                         to receive sedatives. The heart rate, respiratory                         rate, oxygen saturations, blood pressure, adequacy of                         pulmonary ventilation, and response to care were                         monitored throughout the procedure. The physical                         status of the patient was re-assessed after the                         procedure.                        After obtaining informed consent, the endoscope was                         passed under direct vision. Throughout the procedure,                         the patient's blood pressure, pulse, and oxygen                         saturations were monitored continuously. The Endoscope  was introduced through the mouth, and advanced to the                         second part of duodenum. The upper GI endoscopy was                         accomplished without difficulty. The patient tolerated                         the procedure well. Findings:      The duodenal bulb and second portion of the duodenum were normal.      Multiple dispersed medium sized erosions with stigmata of recent       bleeding were found on the greater curvature of the gastric body.       Biopsies were taken with a cold forceps for Helicobacter pylori testing.      The cardia and gastric fundus were normal on retroflexion.      Esophagogastric  landmarks were identified: the gastroesophageal junction       was found at 36 cm from the incisors.      The gastroesophageal junction and examined esophagus were normal. Impression:            - Normal duodenal bulb and second portion of the                         duodenum.                        - Erosive gastropathy with stigmata of recent                         bleeding. Biopsied.                        - Esophagogastric landmarks identified.                        - Normal gastroesophageal junction and esophagus. Recommendation:        - Await pathology results.                        - Use Prilosec (omeprazole) 40 mg PO BID.                        - Return to my office as previously scheduled.                        - Proceed with colonoscopy as scheduled                        See colonoscopy report Procedure Code(s):     --- Professional ---                        [id number], Esophagogastroduodenoscopy, flexible,                         transoral; with biopsy, single or multiple Diagnosis Code(s):     --- Professional ---  K92.2, Gastrointestinal hemorrhage, unspecified                        R10.13, Epigastric pain                        R12, Heartburn CPT copyright 2019 American Medical Association. All rights reserved. The codes documented in this report are preliminary and upon coder review may  be revised to meet current compliance requirements. Dr. Ulyess Mort Lin Landsman MD, MD 09/27/2019 9:00:19 AM This report has been signed electronically. Number of Addenda: 0 Note Initiated On: 09/27/2019 8:34 AM Estimated Blood Loss:  Estimated blood loss: none.      [facility]

## 2019-09-28 ENCOUNTER — Encounter: Payer: Self-pay | Admitting: *Deleted

## 2019-09-29 ENCOUNTER — Other Ambulatory Visit: Payer: Self-pay | Admitting: Primary Care

## 2019-09-29 LAB — SURGICAL PATHOLOGY

## 2019-09-30 ENCOUNTER — Other Ambulatory Visit: Payer: Self-pay | Admitting: Primary Care

## 2019-10-05 ENCOUNTER — Encounter: Payer: Self-pay | Admitting: Gastroenterology

## 2019-10-06 ENCOUNTER — Other Ambulatory Visit: Payer: Self-pay | Admitting: Gastroenterology

## 2019-10-06 DIAGNOSIS — K580 Irritable bowel syndrome with diarrhea: Secondary | ICD-10-CM

## 2019-10-06 MED ORDER — DICYCLOMINE HCL 10 MG PO CAPS: 10.0000 mg | ORAL_CAPSULE | Freq: Three times a day (TID) | ORAL | 2 refills | Status: DC

## 2019-10-11 ENCOUNTER — Telehealth: Payer: Self-pay

## 2019-10-11 NOTE — Telephone Encounter (Signed)
Patient pharmacy is calling because the patient is requesting 120 capsules of the Dicyclomine instead of 30 pills. We sent 30 pills on 10/06/2019

## 2019-10-11 NOTE — Telephone Encounter (Signed)
I'm ok with it  RV

## 2019-10-12 ENCOUNTER — Other Ambulatory Visit: Payer: Self-pay

## 2019-10-12 DIAGNOSIS — K580 Irritable bowel syndrome with diarrhea: Secondary | ICD-10-CM

## 2019-10-12 MED ORDER — DICYCLOMINE HCL 10 MG PO CAPS: 10.0000 mg | ORAL_CAPSULE | Freq: Three times a day (TID) | ORAL | 2 refills | Status: DC

## 2019-10-12 NOTE — Telephone Encounter (Signed)
Prescription for Dicyclomine has been sent to pt's pharmacy for #120 tablets. Tried contacting pt to inform her but her mailbox was full.

## 2019-10-18 ENCOUNTER — Telehealth: Payer: Self-pay

## 2019-10-18 NOTE — Telephone Encounter (Signed)
Yes, I can see her on Wednesday  RV

## 2019-10-18 NOTE — Telephone Encounter (Signed)
Patient called and wants an emergency appt for this week. Says she has felt bad since her procedure.

## 2019-10-18 NOTE — Telephone Encounter (Signed)
Patient states she has IBS for years and it was very controlled. Patient states ever since her colonoscopy she has had diarrhea every 3 days. Patient states she can not control it now. Patient states the only thing that helps is Imodium. Patient states she would really like to be seen this week but can not come Tuesday or Friday. Please advised you do have 1 appointments on Wednesday in [place]. Can I bring her in Wednesday in [place]

## 2019-10-18 NOTE — Telephone Encounter (Signed)
Made appointment for Wednesday

## 2019-10-20 ENCOUNTER — Ambulatory Visit (INDEPENDENT_AMBULATORY_CARE_PROVIDER_SITE_OTHER): Payer: Medicare Other | Admitting: Gastroenterology

## 2019-10-20 ENCOUNTER — Other Ambulatory Visit: Payer: Self-pay

## 2019-10-20 ENCOUNTER — Encounter: Payer: Self-pay | Admitting: Gastroenterology

## 2019-10-20 VITALS — BP 127/74 | HR 67 | Temp 98.3°F | Ht 63.0 in | Wt 167.4 lb

## 2019-10-20 DIAGNOSIS — K580 Irritable bowel syndrome with diarrhea: Secondary | ICD-10-CM

## 2019-10-20 NOTE — Progress Notes (Signed)
Cephas Darby, MD [address]  [place]  [place], [place] [address]  Main: [phone]  Fax: [phone]    Gastroenterology Consultation  Referring Provider:     [name], NP Primary Care Physician:  [name], NP Primary Gastroenterologist:  Dr. Cephas Darby Reason for Consultation: IBS, NASH-fibrosis        HPI:   Amy Wilcox is a 69 y.o. Caucasian female with metabolic syndrome referred by Dr. Carlis Abbott, Leticia Penna, NP  for consultation & management of chronic diarrhea. She describes sporadic episodes of non bloody diarrhea, approximately 5/day a/w abdominal cramps. Stools are of variable consistency. Certain foods like raw onions, cream cheese, spicy foods, raw fruits and vegetables, a/w bloating. These occur approximately upto 3 times/month. Also, has constant upper abdominal discomfort, and epigastric burning, on pepcid as needed. Takes imodium daily at bedtime. The symptoms have been chronic and occurring for the past 10 years. She underwent colonoscopy in [place] in 2009 and was normal. She reports that her symptoms got worse and her last colonoscopy and would prefer not to undergo another colonoscopy. Random biopsies was not performed at that time. She does not recall if she was tested for celiac disease or H. pylori infection. She did not have EGD in the past. She is not losing weight. She denies nausea or vomiting, use of antibiotics, sick contacts or recent travel. She is retired and moved along with her husband from [place] about 10 years ago. She has elderly parents and has to travel frequently to [place] which she finds it quite stressful.  She reports that she is trying to eat healthy food.  She is also found to have mildly elevated transaminases since 02/2017. She also has hyperlipidemia and unable to tolerate statins. He is currently on ezetimibe. HCV antibody negative. She denies alcohol use or IV drug abuse  Follow-up visit 12/31/2017 Since  last visit, patient underwent secondary liver disease workup which came back negative except for fatty liver and ultrasound elastography revealed moderate degree of fibrosis. She feels that her IBS symptoms have responded to VSL #3 but could not find it over-the-counter. her insurance did not approve amitriptyline. I switched to Zoloft and she took only one dose which caused headache. She is very anxious about her ultrasound results of the liver.she is here today to discuss about ultrasound results. Her husband is with her today. She wants to know if fatty liver and IBS are connected She is taking artificial sweeteners  Follow-up visit 04/03/2018 She reports that her IBS symptoms are manageable. She tried amitriptyline 50 mg at bedtime which resulted in drowsiness all day next day.  She also tried IBgard which did not provide any symptom relief.  She ordered VSL#3 online, she reports that she is watching what she is eating. She did not lose any weight since last visit.  She denies any other complaints, otherwise  Follow-up visit 09/06/2019 Patient continues to have ongoing loose bowel movements for which she takes half a pill of Imodium at bedtime daily.  She had 2 flareups in the last few months that have lasted for about 2 days.  She is interested to undergo colonoscopy for colon cancer screening.  She is also concerned about ongoing heartburn, epigastric discomfort.  She takes Pepcid as needed which does provide some relief.  Follow-up visit 10/20/2019 Patient requested an urgent follow-up visit due to onset of diarrhea right after the colonoscopy performed on 09/27/2019.  Patient reports that 2  days later, she started experiencing nonbloody bowel movements, several times a day which would last for 3 days followed by no bowel movement for 1 to 2 days.  She has been very frustrated with diarrhea as she cannot go out or visit her father.  Her weight has been stable.  She has been taking Imodium as needed.  She  denies consuming carbonated beverages   NSAIDs: None  Antiplts/Anticoagulants/Anti thrombotics: None  GI Procedures: Colonoscopy in 2009 in [place], normal, biopsies were not performed She denies family history of GI malignancy, inflammatory bowel disease or celiac disease  Past Medical History:  Diagnosis Date  . Essential hypertension   . Fibrocystic breast   . Hyperlipidemia   . IBS (irritable bowel syndrome)   . Idiopathic hematuria   . OSA (obstructive sleep apnea)     Past Surgical History:  Procedure Laterality Date  . COLONOSCOPY WITH PROPOFOL N/A 09/27/2019   Procedure: COLONOSCOPY WITH PROPOFOL;  Surgeon: Lin Landsman, MD;  Location: [facility] ENDOSCOPY;  Service: Gastroenterology;  Laterality: N/A;  . ESOPHAGOGASTRODUODENOSCOPY (EGD) WITH PROPOFOL N/A 09/27/2019   Procedure: ESOPHAGOGASTRODUODENOSCOPY (EGD) WITH PROPOFOL;  Surgeon: Lin Landsman, MD;  Location: [facility] ENDOSCOPY;  Service: Gastroenterology;  Laterality: N/A;  . TONSILLECTOMY AND ADENOIDECTOMY  1961    Current Outpatient Medications:  .  aspirin EC 81 MG tablet, Take 81 mg by mouth daily., Disp: , Rfl:  .  CALCIUM PO, Take by mouth., Disp: , Rfl:  .  Cholecalciferol (VITAMIN D) 2000 units CAPS, Take by mouth., Disp: , Rfl:  .  cyanocobalamin 500 MCG tablet, Take 500 mcg by mouth daily., Disp: , Rfl:  .  dicyclomine (BENTYL) 10 MG capsule, Take 1 capsule (10 mg total) by mouth 4 (four) times daily -  before meals and at bedtime., Disp: 120 capsule, Rfl: 2 .  ezetimibe (ZETIA) 10 MG tablet, TAKE 1 TABLET BY MOUTH EVERY DAY, Disp: 90 tablet, Rfl: 1 .  hydrOXYzine (ATARAX/VISTARIL) 10 MG tablet, Take 1-2 tablets (10-20 mg total) by mouth 2 (two) times daily as needed for anxiety., Disp: 30 tablet, Rfl: 0 .  loperamide (IMODIUM) 2 MG capsule, Taking 1/2 capsule by mouth at bedtime, Disp: , Rfl:  .  metoprolol succinate (TOPROL-XL) 50 MG 24 hr tablet, TAKE 1 TABLET (50 MG TOTAL) BY MOUTH DAILY. TAKE WITH  OR IMMEDIATELY FOLLOWING A MEAL., Disp: 90 tablet, Rfl: 1 .  Omega-3 Fatty Acids (FISH OIL PO), Take 1,200 mg by mouth 2 (two) times daily., Disp: , Rfl:  .  omeprazole (PRILOSEC) 40 MG capsule, Take 1 capsule (40 mg total) by mouth 2 (two) times daily before a meal., Disp: 60 capsule, Rfl: 2 .  valsartan-hydrochlorothiazide (DIOVAN-HCT) 80-12.5 MG tablet, TAKE 1 TABLET BY MOUTH EVERY DAY, Disp: 90 tablet, Rfl: 1    Family History  Problem Relation Age of Onset  . Hypertension Mother   . Hyperlipidemia Mother   . Alzheimer's disease Mother   . Heart disease Father   . Heart attack Father   . Hyperlipidemia Father   . Hypertension Father      Social History   Tobacco Use  . Smoking status: Never Smoker  . Smokeless tobacco: Never Used  Substance Use Topics  . Alcohol use: No  . Drug use: No    Allergies as of 10/20/2019 - Review Complete 10/20/2019  Allergen Reaction Noted  . Livalo [pitavastatin]  02/20/2017  . Statins  02/20/2017    Review of Systems:    All  systems reviewed and negative except where noted in HPI.   Physical Exam:  BP 127/74   Pulse 67   Temp 98.3 F (36.8 C) (Oral)   Ht 5\' 3"  (1.6 m)   Wt 167 lb 6.4 oz (75.9 kg)   BMI 29.65 kg/m  No LMP recorded. Patient is postmenopausal.  General:   Alert,  Well-developed, well-nourished, pleasant and cooperative in NAD Head:  Normocephalic and atraumatic. Eyes:  Sclera clear, no icterus.   Conjunctiva pink. Ears:  Normal auditory acuity. Nose:  No deformity, discharge, or lesions. Mouth:  No deformity or lesions,oropharynx pink & moist. Neck:  Supple; no masses or thyromegaly. Lungs:  Respirations even and unlabored.  Clear throughout to auscultation.   No wheezes, crackles, or rhonchi. No acute distress. Heart:  Regular rate and rhythm; no murmurs, clicks, rubs, or gallops. Abdomen:  Normal bowel sounds. Soft, non-tender and nondistended without masses, hepatosplenomegaly or hernias noted.  No guarding  or rebound tenderness.   Rectal: Not performed Msk:  Symmetrical without gross deformities. Good, equal movement & strength bilaterally. Pulses:  Normal pulses noted. Extremities:  No clubbing or edema.  No cyanosis. Neurologic:  Alert and oriented x3;  grossly normal neurologically. Skin:  Intact without significant lesions or rashes. No jaundice. Psych:  Alert and cooperative. Normal mood and affect.  Imaging Studies: No abdominal imaging available  Assessment and Plan:   Amy Wilcox is a 69 y.o.  White female with metabolic syndrome, Nash fibrosis, diarrhea predominant IBS here for follow-up of recent exacerbation of diarrhea after colonoscopy.  Diarrhea predominant IBS, recent flareup post colonoscopy.  GI pathogen panel, stool for C. difficile, H. pylori stool antigen test negative, celiac serologies and CRP, fecal calprotectin came back normal Recheck stool studies to rule out infection Trial of align probiotic and IBgard, samples provided If diarrhea is persistent, will try amitriptyline at bedtime Patient will message me via MyChart in 2 weeks  Follow up as needed   Cephas Darby, MD

## 2019-10-21 DIAGNOSIS — K580 Irritable bowel syndrome with diarrhea: Secondary | ICD-10-CM | POA: Diagnosis not present

## 2019-10-25 ENCOUNTER — Other Ambulatory Visit: Payer: Self-pay | Admitting: Gastroenterology

## 2019-10-25 ENCOUNTER — Encounter: Payer: Self-pay | Admitting: Gastroenterology

## 2019-10-25 ENCOUNTER — Telehealth: Payer: Self-pay

## 2019-10-25 DIAGNOSIS — A498 Other bacterial infections of unspecified site: Secondary | ICD-10-CM

## 2019-10-25 DIAGNOSIS — A09 Infectious gastroenteritis and colitis, unspecified: Secondary | ICD-10-CM

## 2019-10-25 LAB — GI PROFILE, STOOL, PCR
Adenovirus F 40/41: NOT DETECTED
Astrovirus: NOT DETECTED
C difficile toxin A/B: NOT DETECTED
Campylobacter: NOT DETECTED
Cryptosporidium: NOT DETECTED
Cyclospora cayetanensis: NOT DETECTED
Entamoeba histolytica: NOT DETECTED
Enteroaggregative E coli: NOT DETECTED
Enteropathogenic E coli: NOT DETECTED
Enterotoxigenic E coli: DETECTED — AB
Giardia lamblia: NOT DETECTED
Norovirus GI/GII: NOT DETECTED
Plesiomonas shigelloides: NOT DETECTED
Rotavirus A: NOT DETECTED
Salmonella: NOT DETECTED
Sapovirus: NOT DETECTED
Shiga-toxin-producing E coli: NOT DETECTED
Shigella/Enteroinvasive E coli: NOT DETECTED
Vibrio cholerae: NOT DETECTED
Vibrio: NOT DETECTED
Yersinia enterocolitica: NOT DETECTED

## 2019-10-25 MED ORDER — AZITHROMYCIN 500 MG PO TABS: 500.0000 mg | ORAL_TABLET | Freq: Every day | ORAL | 0 refills | Status: AC

## 2019-10-25 NOTE — Telephone Encounter (Signed)
-----   Message from Lin Landsman, MD sent at 10/25/2019 12:40 PM EDT ----- I have released results via MyChart.  Please let her know to pick up prescription for antibiotic, azithromycin for 3 daysThank youRohini Vanga

## 2019-10-25 NOTE — Telephone Encounter (Signed)
Patient verbalized understanding. Patient wants to thank Dr. Marius Ditch for all her care

## 2019-11-29 IMAGING — MG MM DIGITAL SCREENING BILAT W/ TOMO W/ CAD
8 series · 8 of 24 positions shown · non-contrast
Comparison: Previous exam(s).

CLINICAL DATA: Screening.

EXAM:
DIGITAL SCREENING BILATERAL MAMMOGRAM WITH TOMO AND CAD

[L MLO synth-2D]
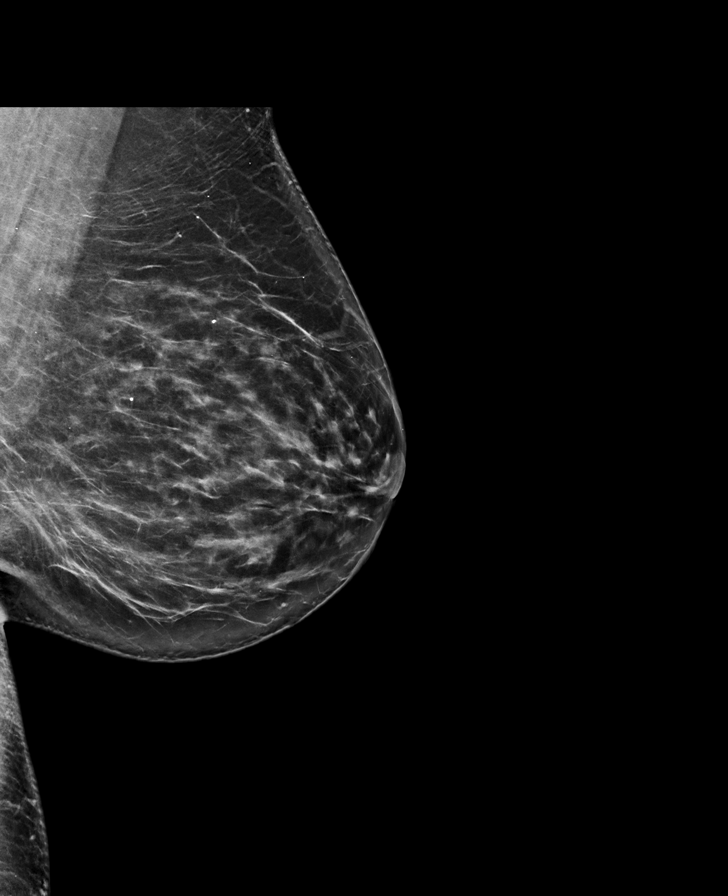

[R CC synth-2D]
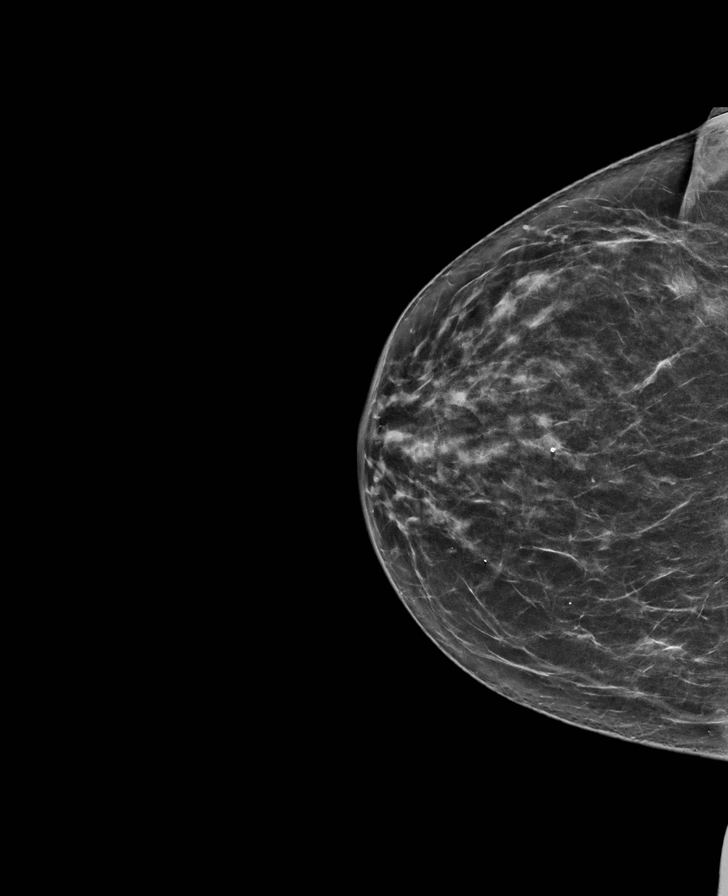

[L CC synth-2D]
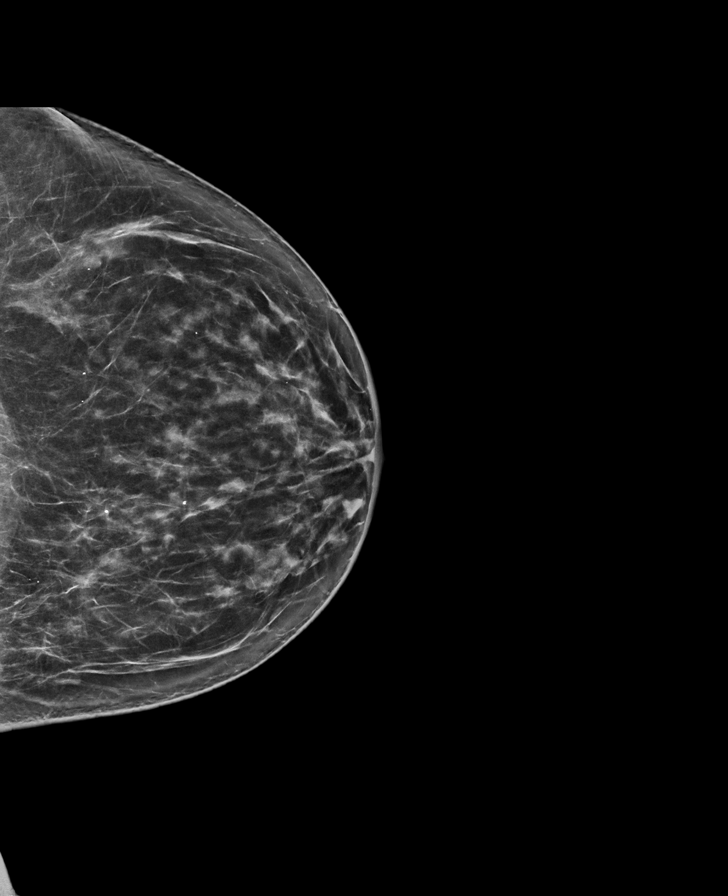

[R MLO synth-2D]
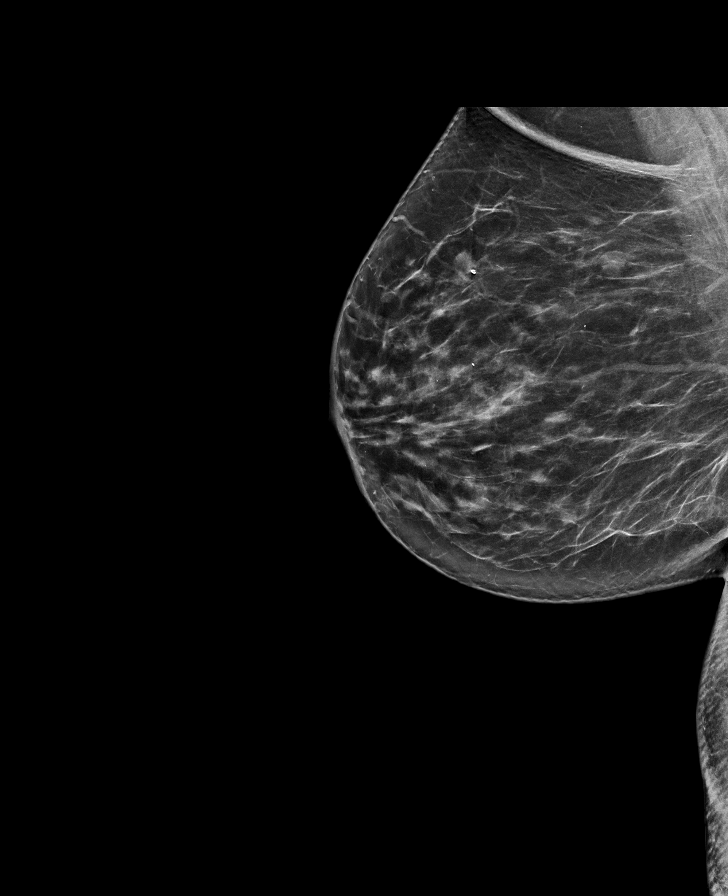

[R CC tomo · tomo slice 34/67.0]
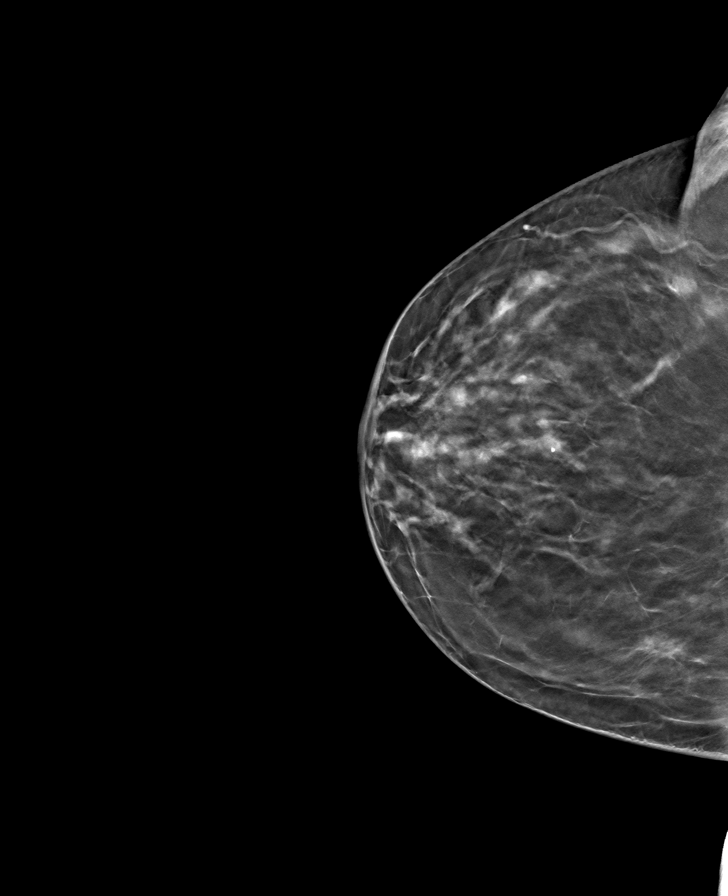

[L MLO tomo · tomo slice 43/85.0]
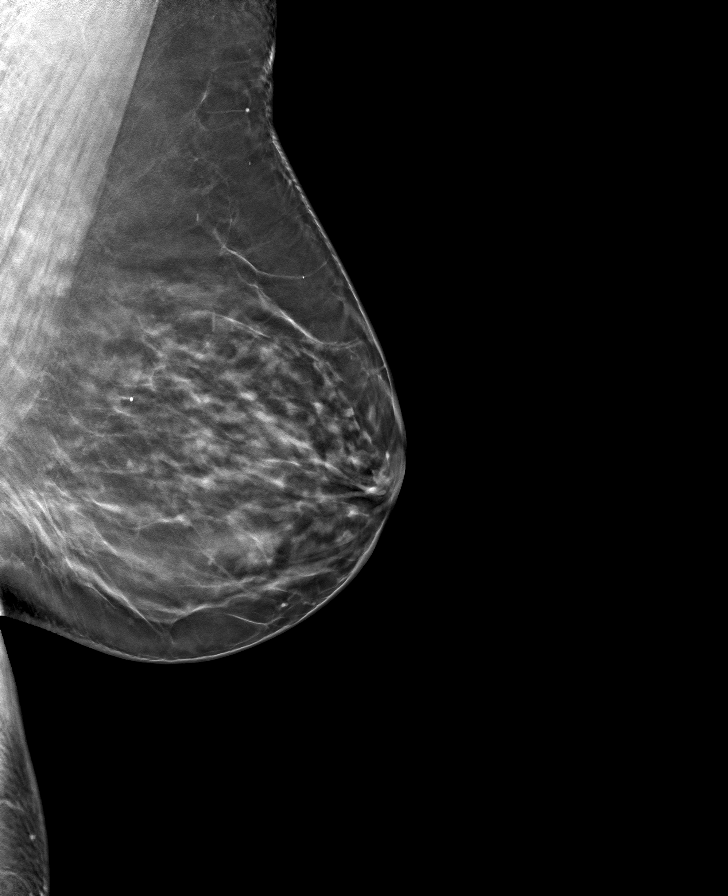

[R MLO tomo · tomo slice 39/78.0]
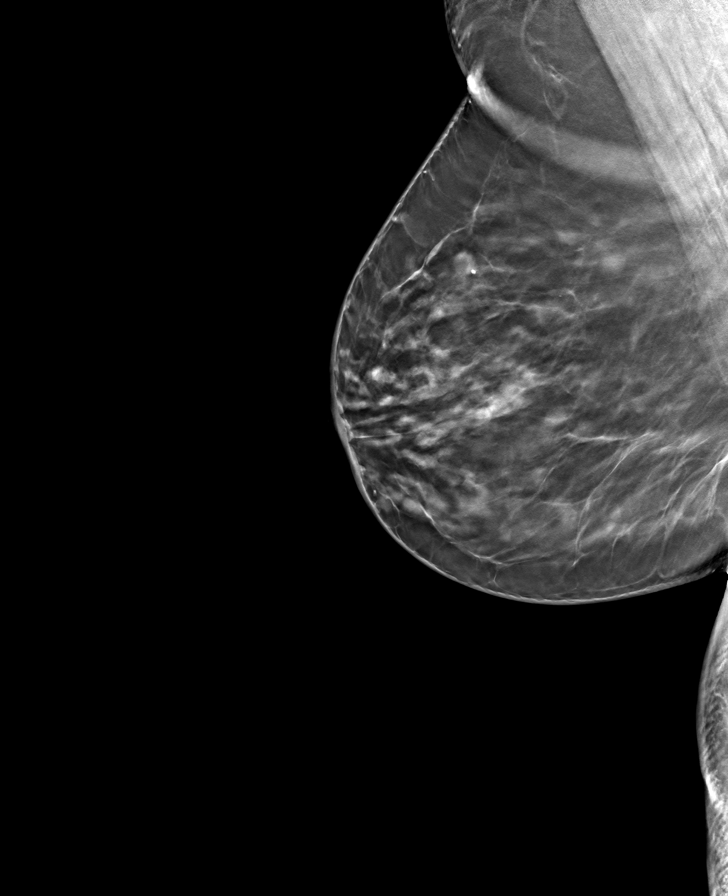

[L CC tomo · tomo slice 37/74.0]
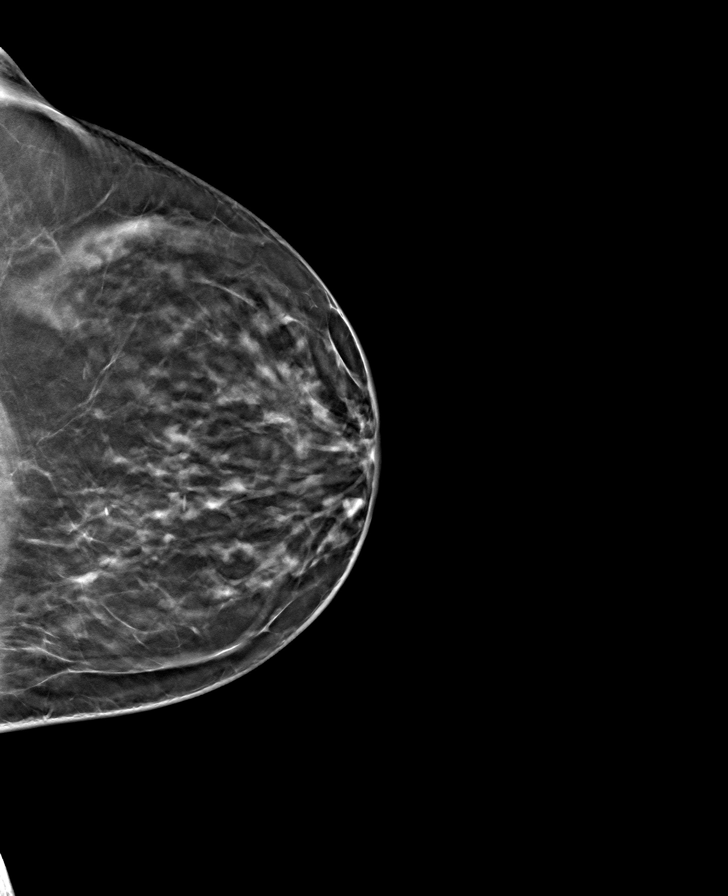

[8 of 24 positions shown; findings below may reference images not displayed]

ACR Breast Density Category b: There are scattered areas of
fibroglandular density.
FINDINGS: There are no findings suspicious for malignancy. Images were
processed with CAD.
IMPRESSION: No mammographic evidence of malignancy. A result letter of this
screening mammogram will be mailed directly to the patient.

RECOMMENDATION:
Screening mammogram in one year. (Code:[id number])

BI-RADS CATEGORY  1: Negative.

## 2019-12-18 ENCOUNTER — Other Ambulatory Visit: Payer: Self-pay | Admitting: Gastroenterology

## 2019-12-28 ENCOUNTER — Encounter: Payer: Self-pay | Admitting: Gastroenterology

## 2019-12-28 ENCOUNTER — Other Ambulatory Visit: Payer: Self-pay | Admitting: Gastroenterology

## 2019-12-28 ENCOUNTER — Other Ambulatory Visit: Payer: Self-pay

## 2019-12-28 DIAGNOSIS — R197 Diarrhea, unspecified: Secondary | ICD-10-CM

## 2019-12-28 DIAGNOSIS — F418 Other specified anxiety disorders: Secondary | ICD-10-CM

## 2019-12-29 MED ORDER — HYDROXYZINE HCL 10 MG PO TABS: 10.0000 mg | ORAL_TABLET | Freq: Two times a day (BID) | ORAL | 0 refills | Status: DC | PRN

## 2019-12-30 ENCOUNTER — Telehealth: Payer: Self-pay

## 2019-12-30 NOTE — Telephone Encounter (Signed)
Per Nichole: husband wants wife's prescriptions sent to [facility] in [place] [phone] if any medicines need to sent/ it's only while she's out of town temporarily

## 2019-12-31 ENCOUNTER — Telehealth: Payer: Self-pay | Admitting: *Deleted

## 2019-12-31 NOTE — Telephone Encounter (Signed)
PA for pt's Atarax was approved. Pharmacy notified and letter placed in PCP's inbox to sign and send for scanning.

## 2019-12-31 NOTE — Telephone Encounter (Signed)
Noted, signed  placed in Chan's inbox

## 2020-01-02 LAB — GI PROFILE, STOOL, PCR
Adenovirus F 40/41: NOT DETECTED
Astrovirus: NOT DETECTED
C difficile toxin A/B: NOT DETECTED
Campylobacter: NOT DETECTED
Cryptosporidium: NOT DETECTED
Cyclospora cayetanensis: NOT DETECTED
Entamoeba histolytica: NOT DETECTED
Enteroaggregative E coli: NOT DETECTED
Enteropathogenic E coli: DETECTED — AB
Enterotoxigenic E coli: NOT DETECTED
Giardia lamblia: NOT DETECTED
Norovirus GI/GII: DETECTED — AB
Plesiomonas shigelloides: NOT DETECTED
Rotavirus A: NOT DETECTED
Salmonella: NOT DETECTED
Sapovirus: NOT DETECTED
Shiga-toxin-producing E coli: NOT DETECTED
Shigella/Enteroinvasive E coli: NOT DETECTED
Vibrio cholerae: NOT DETECTED
Vibrio: NOT DETECTED
Yersinia enterocolitica: NOT DETECTED

## 2020-01-03 ENCOUNTER — Telehealth: Payer: Self-pay | Admitting: Gastroenterology

## 2020-01-03 ENCOUNTER — Encounter: Payer: Self-pay | Admitting: Gastroenterology

## 2020-01-03 MED ORDER — AZITHROMYCIN 500 MG PO TABS: 500.0000 mg | ORAL_TABLET | Freq: Every day | ORAL | 0 refills | Status: AC

## 2020-01-03 NOTE — Telephone Encounter (Signed)
Lori with [employer] called & states pt had a GI Panel dos 10-21-19 by DR Marius Ditch and was denied insurance. They need additional codes to resubmit. DJT#[id number].

## 2020-01-04 NOTE — Telephone Encounter (Signed)
Already took care of this she called me on my phone yesterday. Gave her all the codes that could be used

## 2020-01-17 ENCOUNTER — Other Ambulatory Visit: Payer: Self-pay | Admitting: Primary Care

## 2020-01-17 DIAGNOSIS — I10 Essential (primary) hypertension: Secondary | ICD-10-CM

## 2020-01-17 DIAGNOSIS — E785 Hyperlipidemia, unspecified: Secondary | ICD-10-CM

## 2020-01-17 DIAGNOSIS — R7303 Prediabetes: Secondary | ICD-10-CM

## 2020-01-28 ENCOUNTER — Other Ambulatory Visit (INDEPENDENT_AMBULATORY_CARE_PROVIDER_SITE_OTHER): Payer: Medicare Other

## 2020-01-28 ENCOUNTER — Other Ambulatory Visit: Payer: Self-pay

## 2020-01-28 ENCOUNTER — Ambulatory Visit (INDEPENDENT_AMBULATORY_CARE_PROVIDER_SITE_OTHER): Payer: Medicare Other

## 2020-01-28 DIAGNOSIS — E785 Hyperlipidemia, unspecified: Secondary | ICD-10-CM

## 2020-01-28 DIAGNOSIS — R7303 Prediabetes: Secondary | ICD-10-CM

## 2020-01-28 DIAGNOSIS — Z0000 Encounter for general adult medical examination without abnormal findings: Secondary | ICD-10-CM

## 2020-01-28 DIAGNOSIS — I10 Essential (primary) hypertension: Secondary | ICD-10-CM

## 2020-01-28 LAB — COMPREHENSIVE METABOLIC PANEL
ALT: 16 U/L (ref 0–35)
AST: 14 U/L (ref 0–37)
Albumin: 4.5 g/dL (ref 3.5–5.2)
Alkaline Phosphatase: 40 U/L (ref 39–117)
BUN: 29 mg/dL — ABNORMAL HIGH (ref 6–23)
CO2: 30 mEq/L (ref 19–32)
Calcium: 10.3 mg/dL (ref 8.4–10.5)
Chloride: 104 mEq/L (ref 96–112)
Creatinine, Ser: 1.04 mg/dL (ref 0.40–1.20)
GFR: 52.64 mL/min — ABNORMAL LOW (ref >60.00)
Glucose, Bld: 149 mg/dL — ABNORMAL HIGH (ref 70–99)
Potassium: 3.9 mEq/L (ref 3.5–5.1)
Sodium: 142 mEq/L (ref 135–145)
Total Bilirubin: 0.5 mg/dL (ref 0.2–1.2)
Total Protein: 7.3 g/dL (ref 6.0–8.3)

## 2020-01-28 LAB — LIPID PANEL
Cholesterol: 249 mg/dL — ABNORMAL HIGH (ref 0–200)
HDL: 42.3 mg/dL (ref >39.00)
NonHDL: 206.51
Total CHOL/HDL Ratio: 6
Triglycerides: 250 mg/dL — ABNORMAL HIGH (ref 0.0–149.0)
VLDL: 50 mg/dL — ABNORMAL HIGH (ref 0.0–40.0)

## 2020-01-28 LAB — CBC
HCT: 42.9 % (ref 36.0–46.0)
Hemoglobin: 15 g/dL (ref 12.0–15.0)
MCHC: 35 g/dL (ref 30.0–36.0)
MCV: 86.4 fl (ref 78.0–100.0)
Platelets: 211 10*3/uL (ref 150.0–400.0)
RBC: 4.96 Mil/uL (ref 3.87–5.11)
RDW: 12.8 % (ref 11.5–15.5)
WBC: 7.6 10*3/uL (ref 4.0–10.5)

## 2020-01-28 LAB — LDL CHOLESTEROL, DIRECT: Direct LDL: 169 mg/dL

## 2020-01-28 LAB — HEMOGLOBIN A1C: Hgb A1c MFr Bld: 6 % (ref 4.6–6.5)

## 2020-01-28 NOTE — Progress Notes (Signed)
Subjective:   Amy Wilcox is a 69 y.o. female who presents for Medicare Annual (Subsequent) preventive examination.  Review of Systems: N/A      I connected with the patient today by telephone and verified that I am speaking with the correct person using two identifiers. Location patient: home Location nurse: work Persons participating in the virtual visit: patient, Marine scientist.   I discussed the limitations, risks, security and privacy concerns of performing an evaluation and management service by telephone and the availability of in person appointments. I also discussed with the patient that there may be a patient responsible charge related to this service. The patient expressed understanding and verbally consented to this telephonic visit.    Interactive audio and video telecommunications were attempted between this nurse and patient, however failed, due to patient having technical difficulties OR patient did not have access to video capability.  We continued and completed visit with audio only.     Cardiac Risk Factors include: advanced age (>34men, >28 women);hypertension;dyslipidemia     Objective:    Today's Vitals   There is no height or weight on file to calculate BMI.  Advanced Directives 01/28/2020 09/27/2019 01/25/2019 09/22/2017  Does Patient Have a Medical Advance Directive? Yes Yes Yes Yes  Type of Paramedic of [place];Living will Living will [place];Living will [place];Living will  Does patient want to make changes to medical advance directive? - - No - Patient declined -  Copy of [place] in Chart? No - copy requested - No - copy requested No - copy requested    Current Medications (verified) Outpatient Encounter Medications as of 01/28/2020  Medication Sig  . aspirin EC 81 MG tablet Take 81 mg by mouth daily.  Marland Kitchen CALCIUM PO Take by mouth.  . Cholecalciferol (VITAMIN D) 2000 units CAPS  Take by mouth.  . cyanocobalamin 500 MCG tablet Take 500 mcg by mouth daily.  Marland Kitchen ezetimibe (ZETIA) 10 MG tablet TAKE 1 TABLET BY MOUTH EVERY DAY  . hydrOXYzine (ATARAX/VISTARIL) 10 MG tablet Take 1-2 tablets (10-20 mg total) by mouth 2 (two) times daily as needed for anxiety.  Marland Kitchen loperamide (IMODIUM) 2 MG capsule Taking 1/2 capsule by mouth at bedtime  . metoprolol succinate (TOPROL-XL) 50 MG 24 hr tablet TAKE 1 TABLET (50 MG TOTAL) BY MOUTH DAILY. TAKE WITH OR IMMEDIATELY FOLLOWING A MEAL.  Marland Kitchen Omega-3 Fatty Acids (FISH OIL PO) Take 1,200 mg by mouth 2 (two) times daily.  . valsartan-hydrochlorothiazide (DIOVAN-HCT) 80-12.5 MG tablet TAKE 1 TABLET BY MOUTH EVERY DAY  . dicyclomine (BENTYL) 10 MG capsule Take 1 capsule (10 mg total) by mouth 4 (four) times daily -  before meals and at bedtime.  Marland Kitchen omeprazole (PRILOSEC) 40 MG capsule TAKE 1 CAPSULE (40 MG TOTAL) BY MOUTH 2 (TWO) TIMES DAILY BEFORE A MEAL.   No facility-administered encounter medications on file as of 01/28/2020.    Allergies (verified) Livalo [pitavastatin] and Statins   History: Past Medical History:  Diagnosis Date  . Essential hypertension   . Fibrocystic breast   . Hyperlipidemia   . IBS (irritable bowel syndrome)   . Idiopathic hematuria   . OSA (obstructive sleep apnea)    Past Surgical History:  Procedure Laterality Date  . COLONOSCOPY WITH PROPOFOL N/A 09/27/2019   Procedure: COLONOSCOPY WITH PROPOFOL;  Surgeon: Lin Landsman, MD;  [facility] ENDOSCOPY;  Service: Gastroenterology;  Laterality: N/A;  . ESOPHAGOGASTRODUODENOSCOPY (EGD) WITH PROPOFOL N/A  09/27/2019   Procedure: ESOPHAGOGASTRODUODENOSCOPY (EGD) WITH PROPOFOL;  Surgeon: Lin Landsman, MD;  [facility] ENDOSCOPY;  Service: Gastroenterology;  Laterality: N/A;  . TONSILLECTOMY AND ADENOIDECTOMY  1961   Family History  Problem Relation Age of Onset  . Hypertension Mother   . Hyperlipidemia Mother   . Alzheimer's disease Mother   .  Heart disease Father   . Heart attack Father   . Hyperlipidemia Father   . Hypertension Father    Social History   Socioeconomic History  . Marital status: Married    Spouse name: Not on file  . Number of children: Not on file  . Years of education: Not on file  . Highest education level: Not on file  Occupational History  . Occupation: retired  Tobacco Use  . Smoking status: Never Smoker  . Smokeless tobacco: Never Used  Vaping Use  . Vaping Use: Never used  Substance and Sexual Activity  . Alcohol use: No  . Drug use: No  . Sexual activity: Yes  Other Topics Concern  . Not on file  Social History Narrative   Married.   3 children.   Retired. Once worked a Agricultural engineer.   Enjoys spending time with family.   Social Determinants of Health   Financial Resource Strain: Low Risk   . Difficulty of Paying Living Expenses: Not hard at all  Food Insecurity: No Food Insecurity  . Worried About Charity fundraiser in the Last Year: Never true  . Ran Out of Food in the Last Year: Never true  Transportation Needs: No Transportation Needs  . Lack of Transportation (Medical): No  . Lack of Transportation (Non-Medical): No  Physical Activity: Inactive  . Days of Exercise per Week: 0 days  . Minutes of Exercise per Session: 0 min  Stress: No Stress Concern Present  . Feeling of Stress : Only a little  Social Connections:   . Frequency of Communication with Friends and Family:   . Frequency of Social Gatherings with Friends and Family:   . Attends Religious Services:   . Active Member of Clubs or Organizations:   . Attends Archivist Meetings:   Marland Kitchen Marital Status:     Tobacco Counseling Counseling given: Not Answered   Clinical Intake:  Pre-visit preparation completed: Yes  Pain : No/denies pain     Nutritional Risks: None Diabetes: No  How often do you need to have someone help you when you read instructions, pamphlets, or other written materials from your  doctor or pharmacy?: 1 - Never What is the last grade level you completed in school?: 1 year of college  Diabetic: No Nutrition Risk Assessment:  Has the patient had any N/V/D within the last 2 months?  No  Does the patient have any non-healing wounds?  No  Has the patient had any unintentional weight loss or weight gain?  No   Diabetes:  Is the patient diabetic?  No  If diabetic, was a CBG obtained today?  No  Did the patient bring in their glucometer from home?  No  How often do you monitor your CBG's? N/A.   Financial Strains and Diabetes Management:  Are you having any financial strains with the device, your supplies or your medication? N/A.  Does the patient want to be seen by Chronic Care Management for management of their diabetes?  N/A Would the patient like to be referred to a Nutritionist or for Diabetic Management?  N/A     Interpreter  Needed?: No  Information entered by :: CJohnson, LPN   Activities of Daily Living In your present state of health, do you have any difficulty performing the following activities: 01/28/2020  Hearing? N  Vision? N  Difficulty concentrating or making decisions? N  Walking or climbing stairs? N  Dressing or bathing? N  Doing errands, shopping? N  Preparing Food and eating ? N  Using the Toilet? N  In the past six months, have you accidently leaked urine? N  Do you have problems with loss of bowel control? N  Managing your Medications? N  Managing your Finances? N  Housekeeping or managing your Housekeeping? N  Some recent data might be hidden    Patient Care Team: [name], NP as PCP - General (Internal Medicine)  Indicate any recent Medical Services you may have received from other than [facility] providers in the past year (date may be approximate).     Assessment:   This is a routine wellness examination for Marabelle.  Hearing/Vision screen  Hearing Screening   125Hz  250Hz  500Hz  1000Hz  2000Hz  3000Hz  4000Hz  6000Hz   8000Hz   Right ear:           Left ear:           Vision Screening Comments: Patient gets annual eye exams  Dietary issues and exercise activities discussed: Current Exercise Habits: The patient does not participate in regular exercise at present, Exercise limited by: None identified  Goals    . DIET - INCREASE WATER INTAKE     Starting 09/22/2017, I will attempt to drink at least 3 glasses of water daily. Goal is 6-8 glasses of water daily.     . Patient Stated     01/28/2020, I will maintain and continue medications as prescribed.    . Weight (lb) < 200 lb (90.7 kg)     Wants to lose at least 10 pounds      Depression Screen PHQ 2/9 Scores 01/28/2020 01/25/2019 09/22/2017  PHQ - 2 Score 0 0 0  PHQ- 9 Score 0 0 0    Fall Risk Fall Risk  01/28/2020 06/02/2019 01/25/2019 09/22/2017  Falls in the past year? 0 0 0 No  Comment - Emmi Telephone Survey: data to providers prior to load - -  Number falls in past yr: 0 - - -  Injury with Fall? 0 - - -  Risk for fall due to : Medication side effect - Medication side effect -  Follow up Falls evaluation completed;Falls prevention discussed - Falls evaluation completed;Falls prevention discussed -    Any stairs in or around the home? Yes  If so, are there any without handrails? No  Home free of loose throw rugs in walkways, pet beds, electrical cords, etc? Yes  Adequate lighting in your home to reduce risk of falls? Yes   ASSISTIVE DEVICES UTILIZED TO PREVENT FALLS:  Life alert? No  Use of a cane, walker or w/c? No  Grab bars in the bathroom? No  Shower chair or bench in shower? No  Elevated toilet seat or a handicapped toilet? No   TIMED UP AND GO:  Was the test performed? N/A, telephonic visit .    Cognitive Function: MMSE - Mini Mental State Exam 01/28/2020 01/25/2019 09/22/2017  Orientation to time 5 5 5   Orientation to Place 5 5 5   Registration 3 3 3   Attention/ Calculation 5 5 0  Recall 3 3 3   Language- name 2 objects - 0 0  Language- repeat 1 1 1   Language- follow 3 step command - 0 3  Language- read & follow direction - 0 0  Write a sentence - 0 0  Copy design - 0 0  Total score - 22 20  Mini Cog  Mini-Cog screen was completed. Maximum score is 22. A value of 0 denotes this part of the MMSE was not completed or the patient failed this part of the Mini-Cog screening.       Immunizations Immunization History  Administered Date(s) Administered  . Influenza, High Dose Seasonal PF 04/08/2018, 04/02/2019  . PFIZER SARS-COV-2 Vaccination 09/19/2019, 10/11/2019  . Pneumococcal Conjugate-13 04/30/2016  . Pneumococcal Polysaccharide-23 10/02/2017  . Tdap 03/17/2007    TDAP status: Due, Education has been provided regarding the importance of this vaccine. Advised may receive this vaccine at local pharmacy or Health Dept. Aware to provide a copy of the vaccination record if obtained from local pharmacy or Health Dept. Verbalized acceptance and understanding. Flu Vaccine status: Up to date Pneumococcal vaccine status: Up to date Covid-19 vaccine status: Completed vaccines  Qualifies for Shingles Vaccine? Yes   Zostavax completed No   Shingrix Completed?: No.    Education has been provided regarding the importance of this vaccine. Patient has been advised to call insurance company to determine out of pocket expense if they have not yet received this vaccine. Advised may also receive vaccine at local pharmacy or Health Dept. Verbalized acceptance and understanding.  Screening Tests Health Maintenance  Topic Date Due  . TETANUS/TDAP  01/28/2024 (Originally 03/16/2017)  . INFLUENZA VACCINE  02/06/2020  . MAMMOGRAM  05/13/2021  . COLONOSCOPY  09/26/2029  . DEXA SCAN  Completed  . COVID-19 Vaccine  Completed  . Hepatitis C Screening  Completed  . PNA vac Low Risk Adult  Completed    Health Maintenance  There are no preventive care reminders to display for this patient.  Colorectal cancer screening:  Completed 09/27/2019. Repeat every 10 years Mammogram status: Completed 05/14/2019. Repeat every year Bone Density status: due, will complete when mammogram is due  Lung Cancer Screening: (Low Dose CT Chest recommended if Age 79-80 years, 30 pack-year currently smoking OR have quit w/in 15years.) does not qualify.    Additional Screening:  Hepatitis C Screening: does qualify; Completed 12/17/2017  Vision Screening: Recommended annual ophthalmology exams for early detection of glaucoma and other disorders of the eye. Is the patient up to date with their annual eye exam?  Yes  Who is the provider or what is the name of the office in which the patient attends annual eye exams? Dr. Lysle Morales If pt is not established with a provider, would they like to be referred to a provider to establish care? No .   Dental Screening: Recommended annual dental exams for proper oral hygiene  Community Resource Referral / Chronic Care Management: CRR required this visit?  No   CCM required this visit?  No      Plan:     I have personally reviewed and noted the following in the patient's chart:   . Medical and social history . Use of alcohol, tobacco or illicit drugs  . Current medications and supplements . Functional ability and status . Nutritional status . Physical activity . Advanced directives . List of other physicians . Hospitalizations, surgeries, and ER visits in previous 12 months . Vitals . Screenings to include cognitive, depression, and falls . Referrals and appointments  In addition, I have reviewed and discussed with patient  certain preventive protocols, quality metrics, and best practice recommendations. A written personalized care plan for preventive services as well as general preventive health recommendations were provided to patient.   Due to this being a telephonic visit, the after visit summary with patients personalized plan was offered to patient via mail or my-chart.  Patient  preferred to pick up at office at next visit.   Andrez Grime, LPN   [id number]

## 2020-01-28 NOTE — Patient Instructions (Addendum)
[name] , Thank you for taking time to come for your Medicare Wellness Visit. I appreciate your ongoing commitment to your health goals. Please review the following plan we discussed and let me know if I can assist you in the future.   Screening recommendations/referrals: Colonoscopy: Up to date, completed 09/27/2019, due 09/2029 Mammogram: Up to date, completed 05/14/2019, due 05/2020 Bone Density: due, will complete when mammogram is due Recommended yearly ophthalmology/optometry visit for glaucoma screening and checkup Recommended yearly dental visit for hygiene and checkup  Vaccinations: Influenza vaccine: Up to date, completed 04/02/2019, due 02/2020 Pneumococcal vaccine: Completed series Tdap vaccine: decline- insurance/financial Shingles vaccine: due, check with your insurance regarding coverage   Covid-19:Completed series  Advanced directives: Please bring a copy of your POA (Power of Attorney) and/or Living Will to your next appointment.    Conditions/risks identified: hypertension, hyperlipidemia  Next appointment: Follow up in one year for your annual wellness visit    Preventive Care 4 Years and Older, Female Preventive care refers to lifestyle choices and visits with your health care provider that can promote health and wellness. What does preventive care include?  A yearly physical exam. This is also called an annual well check.  Dental exams once or twice a year.  Routine eye exams. Ask your health care provider how often you should have your eyes checked.  Personal lifestyle choices, including:  Daily care of your teeth and gums.  Regular physical activity.  Eating a healthy diet.  Avoiding tobacco and drug use.  Limiting alcohol use.  Practicing safe sex.  Taking low-dose aspirin every day.  Taking vitamin and mineral supplements as recommended by your health care provider. What happens during an annual well check? The services and screenings done by  your health care provider during your annual well check will depend on your age, overall health, lifestyle risk factors, and family history of disease. Counseling  Your health care provider may ask you questions about your:  Alcohol use.  Tobacco use.  Drug use.  Emotional well-being.  Home and relationship well-being.  Sexual activity.  Eating habits.  History of falls.  Memory and ability to understand (cognition).  Work and work Statistician.  Reproductive health. Screening  You may have the following tests or measurements:  Height, weight, and BMI.  Blood pressure.  Lipid and cholesterol levels. These may be checked every 5 years, or more frequently if you are over 28 years old.  Skin check.  Lung cancer screening. You may have this screening every year starting at age 6 if you have a 30-pack-year history of smoking and currently smoke or have quit within the past 15 years.  Fecal occult blood test (FOBT) of the stool. You may have this test every year starting at age 107.  Flexible sigmoidoscopy or colonoscopy. You may have a sigmoidoscopy every 5 years or a colonoscopy every 10 years starting at age 67.  Hepatitis C blood test.  Hepatitis B blood test.  Sexually transmitted disease (STD) testing.  Diabetes screening. This is done by checking your blood sugar (glucose) after you have not eaten for a while (fasting). You may have this done every 1-3 years.  Bone density scan. This is done to screen for osteoporosis. You may have this done starting at age 66.  Mammogram. This may be done every 1-2 years. Talk to your health care provider about how often you should have regular mammograms. Talk with your health care provider about your test results, treatment options,  and if necessary, the need for more tests. Vaccines  Your health care provider may recommend certain vaccines, such as:  Influenza vaccine. This is recommended every year.  Tetanus,  diphtheria, and acellular pertussis (Tdap, Td) vaccine. You may need a Td booster every 10 years.  Zoster vaccine. You may need this after age 82.  Pneumococcal 13-valent conjugate (PCV13) vaccine. One dose is recommended after age 87.  Pneumococcal polysaccharide (PPSV23) vaccine. One dose is recommended after age 50. Talk to your health care provider about which screenings and vaccines you need and how often you need them. This information is not intended to replace advice given to you by your health care provider. Make sure you discuss any questions you have with your health care provider. Document Released: 07/21/2015 Document Revised: 03/13/2016 Document Reviewed: 04/25/2015 Elsevier Interactive Patient Education  2017 [place] Prevention in the Home Falls can cause injuries. They can happen to people of all ages. There are many things you can do to make your home safe and to help prevent falls. What can I do on the outside of my home?  Regularly fix the edges of walkways and driveways and fix any cracks.  Remove anything that might make you trip as you walk through a door, such as a raised step or threshold.  Trim any bushes or trees on the path to your home.  Use bright outdoor lighting.  Clear any walking paths of anything that might make someone trip, such as rocks or tools.  Regularly check to see if handrails are loose or broken. Make sure that both sides of any steps have handrails.  Any raised decks and porches should have guardrails on the edges.  Have any leaves, snow, or ice cleared regularly.  Use sand or salt on walking paths during winter.  Clean up any spills in your garage right away. This includes oil or grease spills. What can I do in the bathroom?  Use night lights.  Install grab bars by the toilet and in the tub and shower. Do not use towel bars as grab bars.  Use non-skid mats or decals in the tub or shower.  If you need to sit down in  the shower, use a plastic, non-slip stool.  Keep the floor dry. Clean up any water that spills on the floor as soon as it happens.  Remove soap buildup in the tub or shower regularly.  Attach bath mats securely with double-sided non-slip rug tape.  Do not have throw rugs and other things on the floor that can make you trip. What can I do in the bedroom?  Use night lights.  Make sure that you have a light by your bed that is easy to reach.  Do not use any sheets or blankets that are too big for your bed. They should not hang down onto the floor.  Have a firm chair that has side arms. You can use this for support while you get dressed.  Do not have throw rugs and other things on the floor that can make you trip. What can I do in the kitchen?  Clean up any spills right away.  Avoid walking on wet floors.  Keep items that you use a lot in easy-to-reach places.  If you need to reach something above you, use a strong step stool that has a grab bar.  Keep electrical cords out of the way.  Do not use floor polish or wax that makes floors slippery. If  you must use wax, use non-skid floor wax.  Do not have throw rugs and other things on the floor that can make you trip. What can I do with my stairs?  Do not leave any items on the stairs.  Make sure that there are handrails on both sides of the stairs and use them. Fix handrails that are broken or loose. Make sure that handrails are as long as the stairways.  Check any carpeting to make sure that it is firmly attached to the stairs. Fix any carpet that is loose or worn.  Avoid having throw rugs at the top or bottom of the stairs. If you do have throw rugs, attach them to the floor with carpet tape.  Make sure that you have a light switch at the top of the stairs and the bottom of the stairs. If you do not have them, ask someone to add them for you. What else can I do to help prevent falls?  Wear shoes that:  Do not have high  heels.  Have rubber bottoms.  Are comfortable and fit you well.  Are closed at the toe. Do not wear sandals.  If you use a stepladder:  Make sure that it is fully opened. Do not climb a closed stepladder.  Make sure that both sides of the stepladder are locked into place.  Ask someone to hold it for you, if possible.  Clearly mark and make sure that you can see:  Any grab bars or handrails.  First and last steps.  Where the edge of each step is.  Use tools that help you move around (mobility aids) if they are needed. These include:  Canes.  Walkers.  Scooters.  Crutches.  Turn on the lights when you go into a dark area. Replace any light bulbs as soon as they burn out.  Set up your furniture so you have a clear path. Avoid moving your furniture around.  If any of your floors are uneven, fix them.  If there are any pets around you, be aware of where they are.  Review your medicines with your doctor. Some medicines can make you feel dizzy. This can increase your chance of falling. Ask your doctor what other things that you can do to help prevent falls. This information is not intended to replace advice given to you by your health care provider. Make sure you discuss any questions you have with your health care provider. Document Released: 04/20/2009 Document Revised: 11/30/2015 Document Reviewed: 07/29/2014 Elsevier Interactive Patient Education  2017 [employer].

## 2020-01-28 NOTE — Progress Notes (Signed)
PCP notes:  Health Maintenance: Tdap- insurance/financial Dexa- due  Abnormal Screenings: none   Patient concerns: none   Nurse concerns: none   Next PCP appt.: 02/03/2020 @ 9 am

## 2020-02-03 ENCOUNTER — Other Ambulatory Visit: Payer: Self-pay

## 2020-02-03 ENCOUNTER — Ambulatory Visit (INDEPENDENT_AMBULATORY_CARE_PROVIDER_SITE_OTHER): Payer: Medicare Other | Admitting: Primary Care

## 2020-02-03 ENCOUNTER — Encounter: Payer: Self-pay | Admitting: Primary Care

## 2020-02-03 VITALS — BP 130/82 | HR 65 | Temp 95.9°F | Ht 63.0 in | Wt 168.0 lb

## 2020-02-03 DIAGNOSIS — E2839 Other primary ovarian failure: Secondary | ICD-10-CM | POA: Diagnosis not present

## 2020-02-03 DIAGNOSIS — I10 Essential (primary) hypertension: Secondary | ICD-10-CM

## 2020-02-03 DIAGNOSIS — N029 Recurrent and persistent hematuria with unspecified morphologic changes: Secondary | ICD-10-CM | POA: Diagnosis not present

## 2020-02-03 DIAGNOSIS — Z1231 Encounter for screening mammogram for malignant neoplasm of breast: Secondary | ICD-10-CM

## 2020-02-03 DIAGNOSIS — K580 Irritable bowel syndrome with diarrhea: Secondary | ICD-10-CM | POA: Diagnosis not present

## 2020-02-03 DIAGNOSIS — G4733 Obstructive sleep apnea (adult) (pediatric): Secondary | ICD-10-CM

## 2020-02-03 DIAGNOSIS — K760 Fatty (change of) liver, not elsewhere classified: Secondary | ICD-10-CM

## 2020-02-03 DIAGNOSIS — F418 Other specified anxiety disorders: Secondary | ICD-10-CM

## 2020-02-03 DIAGNOSIS — E785 Hyperlipidemia, unspecified: Secondary | ICD-10-CM

## 2020-02-03 DIAGNOSIS — K2100 Gastro-esophageal reflux disease with esophagitis, without bleeding: Secondary | ICD-10-CM

## 2020-02-03 DIAGNOSIS — R7303 Prediabetes: Secondary | ICD-10-CM

## 2020-02-03 DIAGNOSIS — Z23 Encounter for immunization: Secondary | ICD-10-CM

## 2020-02-03 HISTORY — DX: Recurrent and persistent hematuria with unspecified morphologic changes: N02.9

## 2020-02-03 LAB — POC URINALSYSI DIPSTICK (AUTOMATED)
Bilirubin, UA: NEGATIVE
Blood, UA: NEGATIVE
Glucose, UA: NEGATIVE
Ketones, UA: NEGATIVE
Leukocytes, UA: NEGATIVE
Nitrite, UA: NEGATIVE
Protein, UA: POSITIVE — AB
Spec Grav, UA: 1.025 (ref 1.010–1.025)
Urobilinogen, UA: 1 E.U./dL
pH, UA: 6 (ref 5.0–8.0)

## 2020-02-03 MED ORDER — ZOSTER VAC RECOMB ADJUVANTED 50 MCG/0.5ML IM SUSR: 0.5000 mL | Freq: Once | INTRAMUSCULAR | 1 refills | Status: AC

## 2020-02-03 NOTE — Assessment & Plan Note (Addendum)
Doing well on omeprazole 40 mg once daily per GI. Continue same.

## 2020-02-03 NOTE — Progress Notes (Signed)
Subjective:    Patient ID: Amy Wilcox, female    DOB: 05-25-1951, 69 y.o.   MRN: [id number]  HPI  This visit occurred during the SARS-CoV-2 public health emergency.  Safety protocols were in place, including screening questions prior to the visit, additional usage of staff PPE, and extensive cleaning of exam room while observing appropriate contact time as indicated for disinfecting solutions.   Amy Wilcox is a 69 year old female who presents today for [facility] Part [facility]. She spoke with our health advisor last week.  Immunizations: -Tetanus: Completed in 2008 -Influenza: Due this season  -Shingles: Never completed  -Pneumonia: Completed Prevnar and Pneumovax -Covid-19: Completed series  Mammogram: Completed in November 2020 Dexa: Completed in 2019 Colonoscopy: Completed in 2021, due in 2031 Hep C Screen: Negative  BP Readings from Last 3 Encounters:  02/03/20 (!) 130/82  10/20/19 127/74  09/27/19 (!) 116/55    Wt Readings from Last 3 Encounters:  02/03/20 168 lb (76.2 kg)  10/20/19 167 lb 6.4 oz (75.9 kg)  09/27/19 166 lb (75.3 kg)   The 10-year ASCVD risk score Amy Bussing DC Jr., et al., 2013) is: 12.3%   Values used to calculate the score:     Age: 85 years     Sex: Female     Is Non-Hispanic African American: No     Diabetic: No     Tobacco smoker: No     Systolic Blood Pressure: 962 mmHg     Is BP treated: Yes     HDL Cholesterol: 42.3 mg/dL     Total Cholesterol: 249 mg/dL   Review of Systems  Constitutional: Negative for unexpected weight change.  HENT: Negative for rhinorrhea.   Respiratory: Negative for cough and shortness of breath.   Cardiovascular: Negative for chest pain.  Gastrointestinal: Negative for constipation and diarrhea.  Genitourinary: Negative for difficulty urinating.  Musculoskeletal: Negative for arthralgias.  Skin: Negative for rash.  Allergic/Immunologic: Negative for environmental allergies.  Neurological: Negative for dizziness, numbness and  headaches.  Psychiatric/Behavioral: The patient is not hyperactive.        Past Medical History:  Diagnosis Date  . Essential hypertension   . Fibrocystic breast   . Hyperlipidemia   . IBS (irritable bowel syndrome)   . Idiopathic hematuria   . OSA (obstructive sleep apnea)      Social History   Socioeconomic History  . Marital status: Married    Spouse name: Not on file  . Number of children: Not on file  . Years of education: Not on file  . Highest education level: Not on file  Occupational History  . Occupation: retired  Tobacco Use  . Smoking status: Never Smoker  . Smokeless tobacco: Never Used  Vaping Use  . Vaping Use: Never used  Substance and Sexual Activity  . Alcohol use: No  . Drug use: No  . Sexual activity: Yes  Other Topics Concern  . Not on file  Social History Narrative   Married.   3 children.   Retired. Once worked a Agricultural engineer.   Enjoys spending time with family.   Social Determinants of Health   Financial Resource Strain: Low Risk   . Difficulty of Paying Living Expenses: Not hard at all  Food Insecurity: No Food Insecurity  . Worried About Charity fundraiser in the Last Year: Never true  . Ran Out of Food in the Last Year: Never true  Transportation Needs: No Transportation Needs  . Lack of Transportation (Medical):  No  . Lack of Transportation (Non-Medical): No  Physical Activity: Inactive  . Days of Exercise per Week: 0 days  . Minutes of Exercise per Session: 0 min  Stress: No Stress Concern Present  . Feeling of Stress : Only a little  Social Connections:   . Frequency of Communication with Friends and Family:   . Frequency of Social Gatherings with Friends and Family:   . Attends Religious Services:   . Active Member of Clubs or Organizations:   . Attends Archivist Meetings:   Marland Kitchen Marital Status:   Intimate Partner Violence: Not At Risk  . Fear of Current or Ex-Partner: No  . Emotionally Abused: No  . Physically  Abused: No  . Sexually Abused: No    Past Surgical History:  Procedure Laterality Date  . COLONOSCOPY WITH PROPOFOL N/A 09/27/2019   Procedure: COLONOSCOPY WITH PROPOFOL;  Surgeon: Amy Landsman, MD;  [facility] ENDOSCOPY;  Service: Gastroenterology;  Laterality: N/A;  . ESOPHAGOGASTRODUODENOSCOPY (EGD) WITH PROPOFOL N/A 09/27/2019   Procedure: ESOPHAGOGASTRODUODENOSCOPY (EGD) WITH PROPOFOL;  Surgeon: Amy Landsman, MD;  Location: [facility] ENDOSCOPY;  Service: Gastroenterology;  Laterality: N/A;  . TONSILLECTOMY AND ADENOIDECTOMY  1961    Family History  Problem Relation Age of Onset  . Hypertension Mother   . Hyperlipidemia Mother   . Alzheimer's disease Mother   . Heart disease Father   . Heart attack Father   . Hyperlipidemia Father   . Hypertension Father     Allergies  Allergen Reactions  . Livalo [Pitavastatin]   . Statins     Current Outpatient Medications on File Prior to Visit  Medication Sig Dispense Refill  . aspirin EC 81 MG tablet Take 81 mg by mouth daily.    Marland Kitchen CALCIUM PO Take by mouth.    . Cholecalciferol (VITAMIN D) 2000 units CAPS Take by mouth.    . cyanocobalamin 500 MCG tablet Take 500 mcg by mouth daily.    Marland Kitchen ezetimibe (ZETIA) 10 MG tablet TAKE 1 TABLET BY MOUTH EVERY DAY 90 tablet 1  . hydrOXYzine (ATARAX/VISTARIL) 10 MG tablet Take 1-2 tablets (10-20 mg total) by mouth 2 (two) times daily as needed for anxiety. 30 tablet 0  . loperamide (IMODIUM) 2 MG capsule Taking 1/2 capsule by mouth at bedtime as needed    . metoprolol succinate (TOPROL-XL) 50 MG 24 hr tablet TAKE 1 TABLET (50 MG TOTAL) BY MOUTH DAILY. TAKE WITH OR IMMEDIATELY FOLLOWING A MEAL. 90 tablet 1  . Omega-3 Fatty Acids (FISH OIL PO) Take 1,200 mg by mouth 2 (two) times daily.    . valsartan-hydrochlorothiazide (DIOVAN-HCT) 80-12.5 MG tablet TAKE 1 TABLET BY MOUTH EVERY DAY 90 tablet 1  . omeprazole (PRILOSEC) 40 MG capsule TAKE 1 CAPSULE (40 MG TOTAL) BY MOUTH 2 (TWO) TIMES  DAILY BEFORE A MEAL. 180 capsule 0   No current facility-administered medications on file prior to visit.    BP (!) 130/82   Pulse 65   Temp (!) 95.9 F (35.5 C) (Temporal)   Ht 5\' 3"  (1.6 m)   Wt 168 lb (76.2 kg)   SpO2 98%   BMI 29.76 kg/m    Objective:   Physical Exam HENT:     Right Ear: Tympanic membrane and ear canal normal.     Left Ear: Tympanic membrane and ear canal normal.  Eyes:     Pupils: Pupils are equal, round, and reactive to light.  Cardiovascular:     Rate and Rhythm:  Normal rate and regular rhythm.  Pulmonary:     Effort: Pulmonary effort is normal.     Breath sounds: Normal breath sounds.  Abdominal:     General: Bowel sounds are normal.     Palpations: Abdomen is soft.     Tenderness: There is no abdominal tenderness.  Musculoskeletal:        General: Normal range of motion.     Cervical back: Neck supple.  Skin:    General: Skin is warm and dry.  Neurological:     Mental Status: She is alert and oriented to person, place, and time.     Cranial Nerves: No cranial nerve deficit.     Deep Tendon Reflexes:     Reflex Scores:      Patellar reflexes are 2+ on the right side and 2+ on the left side.           Assessment & Plan:

## 2020-02-03 NOTE — Assessment & Plan Note (Signed)
Well controlled 

## 2020-02-03 NOTE — Assessment & Plan Note (Signed)
Following with GI who is monitoring, recent LFT"s are normal. Continue to monitor.

## 2020-02-03 NOTE — Assessment & Plan Note (Signed)
Overall much improved since last year. Continue PRN hydroxyzine.  Continue to monitor.

## 2020-02-03 NOTE — Assessment & Plan Note (Signed)
Compliant to CPAP nightly, continue same.  

## 2020-02-03 NOTE — Assessment & Plan Note (Signed)
Recent A1C of 6.0 which is an increase from six months ago. She will start working on her diet and exercise.   Continue to monitor.

## 2020-02-03 NOTE — Patient Instructions (Signed)
Start exercising. You should be getting 150 minutes of moderate intensity exercise weekly.  It's important to improve your diet by reducing consumption of fast food, fried food, processed snack foods, sugary drinks. Increase consumption of fresh vegetables and fruits, whole grains, water.  Ensure you are drinking 64 ounces of water daily.  Take the shingles vaccine to the pharmacy.  Call to schedule your mammogram and bone density tests.  Please schedule a lab appointment in 6 months for cholesterol, kidney, and blood sugar.  It was a pleasure to see you today!

## 2020-02-03 NOTE — Assessment & Plan Note (Signed)
LDL of 169 with Trigs of 250, increase from six months ago. She admits to a poor diet and little exercise.   She will work on lifestyle changes. Cannot tolerate statins, has tried 5 or 6 types. Continue Zetia and Fish Oil.

## 2020-02-03 NOTE — Assessment & Plan Note (Addendum)
Long history, previously following with Urology, has undergone several cystoscopies. Hematuria determined to be benign. She denies gross hematuria or any changes.  UA today negative.  Given that she's undergone years of testing which reveal unremarkable results, we will continue to monitor UA annually and she will report any changes.

## 2020-02-03 NOTE — Assessment & Plan Note (Signed)
Following with GI, underwent upper and lower endoscopy.  Treated for E coli which caused diarrhea, in April and June 2021, doing better now.   Continue loperamide PRN and omeprazole.

## 2020-02-18 ENCOUNTER — Encounter: Payer: Self-pay | Admitting: Gastroenterology

## 2020-02-18 DIAGNOSIS — R197 Diarrhea, unspecified: Secondary | ICD-10-CM

## 2020-02-21 NOTE — Telephone Encounter (Signed)
Called and left a detail message. Order stool test for patient

## 2020-02-22 DIAGNOSIS — A498 Other bacterial infections of unspecified site: Secondary | ICD-10-CM | POA: Diagnosis not present

## 2020-02-22 DIAGNOSIS — R197 Diarrhea, unspecified: Secondary | ICD-10-CM | POA: Diagnosis not present

## 2020-02-25 LAB — GI PROFILE, STOOL, PCR

## 2020-03-15 ENCOUNTER — Other Ambulatory Visit: Payer: Self-pay | Admitting: Gastroenterology

## 2020-03-19 ENCOUNTER — Other Ambulatory Visit: Payer: Self-pay | Admitting: Primary Care

## 2020-03-24 ENCOUNTER — Other Ambulatory Visit: Payer: Self-pay | Admitting: Primary Care

## 2020-05-16 ENCOUNTER — Other Ambulatory Visit: Payer: Medicare Other

## 2020-05-22 ENCOUNTER — Encounter: Payer: Self-pay | Admitting: Gastroenterology

## 2020-05-22 DIAGNOSIS — R197 Diarrhea, unspecified: Secondary | ICD-10-CM

## 2020-05-22 NOTE — Telephone Encounter (Signed)
Order GI profile PCR

## 2020-05-23 DIAGNOSIS — R197 Diarrhea, unspecified: Secondary | ICD-10-CM | POA: Diagnosis not present

## 2020-05-24 ENCOUNTER — Ambulatory Visit
Admission: RE | Admit: 2020-05-24 | Discharge: 2020-05-24 | Disposition: A | Discharge status: Home / Self Care | Payer: Medicare Other | Source: Ambulatory Visit | Attending: Primary Care | Admitting: Primary Care

## 2020-05-24 ENCOUNTER — Other Ambulatory Visit: Payer: Self-pay

## 2020-05-24 DIAGNOSIS — Z780 Asymptomatic menopausal state: Secondary | ICD-10-CM | POA: Diagnosis not present

## 2020-05-24 DIAGNOSIS — Z1231 Encounter for screening mammogram for malignant neoplasm of breast: Secondary | ICD-10-CM

## 2020-05-24 DIAGNOSIS — Z87828 Personal history of other (healed) physical injury and trauma: Secondary | ICD-10-CM | POA: Diagnosis not present

## 2020-05-24 DIAGNOSIS — E2839 Other primary ovarian failure: Secondary | ICD-10-CM

## 2020-05-24 DIAGNOSIS — M85851 Other specified disorders of bone density and structure, right thigh: Secondary | ICD-10-CM | POA: Diagnosis not present

## 2020-05-25 DIAGNOSIS — Z23 Encounter for immunization: Secondary | ICD-10-CM | POA: Diagnosis not present

## 2020-05-26 LAB — GI PROFILE, STOOL, PCR

## 2020-07-09 ENCOUNTER — Other Ambulatory Visit: Payer: Self-pay | Admitting: Primary Care

## 2020-07-09 DIAGNOSIS — E785 Hyperlipidemia, unspecified: Secondary | ICD-10-CM

## 2020-07-09 DIAGNOSIS — R7303 Prediabetes: Secondary | ICD-10-CM

## 2020-07-21 ENCOUNTER — Other Ambulatory Visit (INDEPENDENT_AMBULATORY_CARE_PROVIDER_SITE_OTHER): Payer: Medicare Other

## 2020-07-21 ENCOUNTER — Other Ambulatory Visit: Payer: Self-pay

## 2020-07-21 ENCOUNTER — Other Ambulatory Visit: Payer: Medicare Other

## 2020-07-21 DIAGNOSIS — E785 Hyperlipidemia, unspecified: Secondary | ICD-10-CM

## 2020-07-21 DIAGNOSIS — R7303 Prediabetes: Secondary | ICD-10-CM

## 2020-07-21 LAB — LIPID PANEL
Cholesterol: 244 mg/dL — ABNORMAL HIGH (ref 0–200)
HDL: 45 mg/dL (ref >39.00)
NonHDL: 198.72
Total CHOL/HDL Ratio: 5
Triglycerides: 279 mg/dL — ABNORMAL HIGH (ref 0.0–149.0)
VLDL: 55.8 mg/dL — ABNORMAL HIGH (ref 0.0–40.0)

## 2020-07-21 LAB — HEMOGLOBIN A1C: Hgb A1c MFr Bld: 5.8 % (ref 4.6–6.5)

## 2020-07-21 LAB — LDL CHOLESTEROL, DIRECT: Direct LDL: 167 mg/dL

## 2020-07-28 DIAGNOSIS — F418 Other specified anxiety disorders: Secondary | ICD-10-CM

## 2020-07-28 DIAGNOSIS — E785 Hyperlipidemia, unspecified: Secondary | ICD-10-CM

## 2020-07-31 MED ORDER — PRAVASTATIN SODIUM 40 MG PO TABS: 40.0000 mg | ORAL_TABLET | Freq: Every day | ORAL | 0 refills | Status: DC

## 2020-07-31 MED ORDER — HYDROXYZINE HCL 10 MG PO TABS
10.0000 mg | ORAL_TABLET | Freq: Two times a day (BID) | ORAL | 0 refills | Status: DC | PRN
Start: 2020-07-31 — End: 2021-01-23

## 2020-08-24 ENCOUNTER — Other Ambulatory Visit: Payer: Self-pay | Admitting: Primary Care

## 2020-08-24 ENCOUNTER — Telehealth: Payer: Self-pay

## 2020-08-24 DIAGNOSIS — E785 Hyperlipidemia, unspecified: Secondary | ICD-10-CM

## 2020-08-24 NOTE — Telephone Encounter (Signed)
Noted. We will repeat cholesterol in a few months during her annual physical.

## 2020-08-24 NOTE — Telephone Encounter (Signed)
Received refill request from pharmacy for Pravachol. I have called patient and reviewed lab results. She wanted to let you know she started taking every other day and has been able to tolerate it. She only started 2 weeks ago. Per patient have declined refill not needed at this time.

## 2020-09-09 ENCOUNTER — Other Ambulatory Visit: Payer: Self-pay | Admitting: Primary Care

## 2020-09-09 DIAGNOSIS — I10 Essential (primary) hypertension: Secondary | ICD-10-CM

## 2020-09-13 ENCOUNTER — Telehealth: Payer: Self-pay | Admitting: Gastroenterology

## 2020-09-13 NOTE — Telephone Encounter (Signed)
Called and left a message for [facility]

## 2020-09-13 NOTE — Telephone Encounter (Signed)
Please call [facility] at [phone], option 2 then option 1 to provide information and further diagnosis  on GI profile 05/23/20.

## 2020-09-16 ENCOUNTER — Other Ambulatory Visit: Payer: Self-pay | Admitting: Primary Care

## 2020-09-16 DIAGNOSIS — E785 Hyperlipidemia, unspecified: Secondary | ICD-10-CM

## 2020-09-16 DIAGNOSIS — I10 Essential (primary) hypertension: Secondary | ICD-10-CM

## 2020-09-18 DIAGNOSIS — G4733 Obstructive sleep apnea (adult) (pediatric): Secondary | ICD-10-CM | POA: Diagnosis not present

## 2020-09-26 ENCOUNTER — Telehealth: Payer: Self-pay

## 2020-09-26 DIAGNOSIS — R0781 Pleurodynia: Secondary | ICD-10-CM | POA: Diagnosis not present

## 2020-09-26 NOTE — Telephone Encounter (Signed)
Noted, will await correspondence.

## 2020-09-26 NOTE — Telephone Encounter (Signed)
[place] Night - Client Nonclinical Telephone Record  [facility] Client [facility] Night - Client Client Site [place] - Night Contact Type Call Who Is Calling Patient / Member / Family / Caregiver Caller Name Janesa Dockery Caller Phone Number [phone] Patient Name Amy Wilcox Patient DOB [birth year] Call Type Message Only Information Provided Reason for Call Request to Schedule Office Appointment Initial Comment Caller fell and may have broken her ribs / caller states on the patient portal she was advised by NP Corinna Capra to come in today [place]. Time Disposition Final User 09/26/2020 7:19:23 AM General Information Provided Yes Kenton Kingfisher, Lanette Call Closed By: Nelia Shi Transaction Date/Time: 09/26/2020 7:16:29 AM (ET)

## 2020-09-26 NOTE — Telephone Encounter (Signed)
Called and spoke with patient who stated that she is on her way to [facility] now.

## 2020-10-20 ENCOUNTER — Other Ambulatory Visit: Payer: Self-pay | Admitting: Primary Care

## 2020-10-20 DIAGNOSIS — E785 Hyperlipidemia, unspecified: Secondary | ICD-10-CM

## 2020-11-18 ENCOUNTER — Other Ambulatory Visit: Payer: Self-pay | Admitting: Primary Care

## 2020-11-18 DIAGNOSIS — E785 Hyperlipidemia, unspecified: Secondary | ICD-10-CM

## 2020-12-07 ENCOUNTER — Other Ambulatory Visit: Payer: Self-pay | Admitting: Primary Care

## 2020-12-07 DIAGNOSIS — I10 Essential (primary) hypertension: Secondary | ICD-10-CM

## 2020-12-18 ENCOUNTER — Other Ambulatory Visit: Payer: Self-pay | Admitting: Primary Care

## 2020-12-18 DIAGNOSIS — I10 Essential (primary) hypertension: Secondary | ICD-10-CM

## 2020-12-18 DIAGNOSIS — E785 Hyperlipidemia, unspecified: Secondary | ICD-10-CM

## 2021-01-17 NOTE — Telephone Encounter (Signed)
Patient needs evaluation.  Please schedule.

## 2021-01-18 NOTE — Telephone Encounter (Signed)
Called pt to make an appt. Pt did not answer so I left a voicemail to call back.

## 2021-01-23 ENCOUNTER — Ambulatory Visit (INDEPENDENT_AMBULATORY_CARE_PROVIDER_SITE_OTHER): Payer: Medicare Other | Admitting: Primary Care

## 2021-01-23 ENCOUNTER — Encounter: Payer: Self-pay | Admitting: Primary Care

## 2021-01-23 ENCOUNTER — Other Ambulatory Visit: Payer: Self-pay

## 2021-01-23 VITALS — BP 130/68 | HR 83 | Temp 96.7°F | Ht 63.0 in | Wt 171.0 lb

## 2021-01-23 DIAGNOSIS — R42 Dizziness and giddiness: Secondary | ICD-10-CM | POA: Diagnosis not present

## 2021-01-23 DIAGNOSIS — F418 Other specified anxiety disorders: Secondary | ICD-10-CM | POA: Diagnosis not present

## 2021-01-23 MED ORDER — HYDROXYZINE HCL 10 MG PO TABS: 10.0000 mg | ORAL_TABLET | Freq: Two times a day (BID) | ORAL | 0 refills | Status: DC | PRN

## 2021-01-23 NOTE — Patient Instructions (Signed)
Try Meclizine 12.5 mg for a return in dizziness symptoms. You can take this up to three times daily. This may cause drowsiness.   Please notify me if your symptoms return.   It was a pleasure to see you today!

## 2021-01-23 NOTE — Assessment & Plan Note (Addendum)
Acute for about 2.5 weeks, improved and nearly resolved now.   Neuro exam negative. HPI is not suggestive of CVA.  ECG today with NSR, rate of 71. No PAC/PVC, ST changes. No prior ECG to compare. Negative orthostatic vitals.   Discussed trial of Meclizine.  She has an appointment for CPE scheduled for next week, will complete labs then as symptoms have resolved.   She will update if anything changes.

## 2021-01-23 NOTE — Progress Notes (Signed)
Subjective:    Patient ID: Amy Wilcox, female    DOB: [birth year], 70 y.o.   MRN: [id number]  HPI  Amy Wilcox is a very pleasant 70 y.o. female with a history of hypertension, OSA, prediabetes, hyperlipidemia, dizziness, anxiety who presents today to discuss dizziness. She is also requesting a refill of her hydroxyzine.   Symptoms began on 01/07/21, had been under a lot of stress with her elderly father who had to undergo pacemaker surgery. She felt unbalanced with positional changes and quick movements of her head, lasted for a few minutes, resolved with rest. Also with parietal head pressure intermittently. Symptoms started to improve one week ago, have nearly resolved.   She's been taking Tylenol Migraine and Dramamine. She denies changes in speech, unilateral weakness, visual changes.   BP Readings from Last 3 Encounters:  01/23/21 130/68  02/03/20 (!) 130/82  10/20/19 127/74      Review of Systems  Respiratory:  Negative for shortness of breath.   Cardiovascular:  Negative for chest pain.  Neurological:  Positive for dizziness and headaches. Negative for speech difficulty, weakness and numbness.        Past Medical History:  Diagnosis Date   Essential hypertension    Fibrocystic breast    Hyperlipidemia    IBS (irritable bowel syndrome)    Idiopathic hematuria    OSA (obstructive sleep apnea)     Social History   Socioeconomic History   Marital status: Married    Spouse name: Not on file   Number of children: Not on file   Years of education: Not on file   Highest education level: Not on file  Occupational History   Occupation: retired  Tobacco Use   Smoking status: Never   Smokeless tobacco: Never  Vaping Use   Vaping Use: Never used  Substance and Sexual Activity   Alcohol use: No   Drug use: No   Sexual activity: Yes  Other Topics Concern   Not on file  Social History Narrative   Married.   3 children.   Retired. Once worked a Agricultural engineer.   Enjoys  spending time with family.   Social Determinants of Health   Financial Resource Strain: Low Risk    Difficulty of Paying Living Expenses: Not hard at all  Food Insecurity: No Food Insecurity   Worried About Charity fundraiser in the Last Year: Never true   [place] in the Last Year: Never true  Transportation Needs: No Transportation Needs   Lack of Transportation (Medical): No   Lack of Transportation (Non-Medical): No  Physical Activity: Inactive   Days of Exercise per Week: 0 days   Minutes of Exercise per Session: 0 min  Stress: No Stress Concern Present   Feeling of Stress : Only a little  Social Connections: Not on file  Intimate Partner Violence: Not At Risk   Fear of Current or Ex-Partner: No   Emotionally Abused: No   Physically Abused: No   Sexually Abused: No    Past Surgical History:  Procedure Laterality Date   COLONOSCOPY WITH PROPOFOL N/A 09/27/2019   Procedure: COLONOSCOPY WITH PROPOFOL;  Surgeon: Lin Landsman, MD;  Location[facility] ENDOSCOPY;  Service: Gastroenterology;  Laterality: N/A;   ESOPHAGOGASTRODUODENOSCOPY (EGD) WITH PROPOFOL N/A 09/27/2019   Procedure: ESOPHAGOGASTRODUODENOSCOPY (EGD) WITH PROPOFOL;  Surgeon: Lin Landsman, MD;  Location[facility] ENDOSCOPY;  Service: Gastroenterology;  Laterality: N/A;   TONSILLECTOMY AND ADENOIDECTOMY  1961    Family History  Problem Relation Age of Onset   Hypertension Mother    Hyperlipidemia Mother    Alzheimer's disease Mother    Heart disease Father    Heart attack Father    Hyperlipidemia Father    Hypertension Father    Breast cancer Neg Hx     Allergies  Allergen Reactions   Livalo [Pitavastatin]    Statins     Current Outpatient Medications on File Prior to Visit  Medication Sig Dispense Refill   aspirin EC 81 MG tablet Take 81 mg by mouth daily.     CALCIUM PO Take by mouth.     Cholecalciferol (VITAMIN D) 2000 units CAPS Take by mouth.     cyanocobalamin 500 MCG tablet Take  500 mcg by mouth daily.     ezetimibe (ZETIA) 10 MG tablet TAKE 1 TABLET BY MOUTH DAILY. FOR CHOLESTEROL 90 tablet 0   hydrOXYzine (ATARAX/VISTARIL) 10 MG tablet Take 1-2 tablets (10-20 mg total) by mouth 2 (two) times daily as needed for anxiety. 30 tablet 0   loperamide (IMODIUM) 2 MG capsule Taking 1/2 capsule by mouth at bedtime as needed     metoprolol succinate (TOPROL-XL) 50 MG 24 hr tablet Take 1 tablet (50 mg total) by mouth daily. 90 tablet 0   Omega-3 Fatty Acids (FISH OIL PO) Take 1,200 mg by mouth 2 (two) times daily.     omeprazole (PRILOSEC) 40 MG capsule TAKE 1 CAPSULE (40 MG TOTAL) BY MOUTH 2 (TWO) TIMES DAILY BEFORE A MEAL. (Patient taking differently: Take 40 mg by mouth 2 (two) times daily before a meal. As needed) 180 capsule 0   valsartan-hydrochlorothiazide (DIOVAN-HCT) 80-12.5 MG tablet TAKE 1 TABLET BY MOUTH DAILY FOR BLOOD PRESSURE 90 tablet 0   No current facility-administered medications on file prior to visit.    BP 130/68   Pulse 83   Temp (!) 96.7 F (35.9 C) (Temporal)   Ht 5\' 3"  (1.6 m)   Wt 171 lb (77.6 kg)   SpO2 95%   BMI 30.29 kg/m  Objective:   Physical Exam HENT:     Right Ear: Tympanic membrane and ear canal normal.     Left Ear: Tympanic membrane and ear canal normal.  Eyes:     Extraocular Movements: Extraocular movements intact.  Cardiovascular:     Rate and Rhythm: Normal rate and regular rhythm.  Pulmonary:     Effort: Pulmonary effort is normal.     Breath sounds: Normal breath sounds.  Musculoskeletal:     Cervical back: Neck supple.  Skin:    General: Skin is warm and dry.  Neurological:     Mental Status: She is oriented to person, place, and time.     Cranial Nerves: No cranial nerve deficit.     Motor: No weakness.     Coordination: Coordination normal.     Gait: Gait normal.          Assessment & Plan:      This visit occurred during the SARS-CoV-2 public health emergency.  Safety protocols were in place,  including screening questions prior to the visit, additional usage of staff PPE, and extensive cleaning of exam room while observing appropriate contact time as indicated for disinfecting solutions.

## 2021-01-29 ENCOUNTER — Telehealth: Payer: Self-pay

## 2021-01-29 NOTE — Telephone Encounter (Signed)
Advised pt the apt is with the health nurse. Pt will be in [place] this week with family so she can have the apt as long as it is on the phone. Advised apt will be on the phone. Pt was appreciative and verbalized understanding.

## 2021-01-29 NOTE — Telephone Encounter (Signed)
[place] Night - Client Nonclinical Telephone Record  [facility] Client [place] Night - Client Client Site [place] Physician Alma Friendly - NP Contact Type Call Who Is Calling Patient / Member / Family / Caregiver Caller Name Arshdeep Arriaza Caller Phone Number [phone] Patient Name Amy Wilcox Patient DOB Aug 18, 1950 Call Type Message Only Information Provided Reason for Call Request for General Office Information Initial Comment Caller states she has an appt. for Tuesday at 1030am for the annual medicare wellness visit. Caller states she was told it could either be on the phone or the office . Caller states she will out of town on Tuesday and she is wanting to know if it will be on the phone. Additional Comment Office hours provided . Disp. Time Disposition Final User 01/27/2021 10:34:13 AM General Information Provided Yes Mikki Santee Call Closed By: Mikki Santee Transaction Date/Time: 01/27/2021 10:30:50 AM (ET)

## 2021-01-30 ENCOUNTER — Ambulatory Visit (INDEPENDENT_AMBULATORY_CARE_PROVIDER_SITE_OTHER): Payer: Medicare Other

## 2021-01-30 DIAGNOSIS — Z0000 Encounter for general adult medical examination without abnormal findings: Secondary | ICD-10-CM

## 2021-01-30 NOTE — Progress Notes (Signed)
Subjective:   Amy Wilcox is a 70 y.o. female who presents for Medicare Annual (Subsequent) preventive examination.  Review of Systems: N/A      I connected with the patient today by telephone and verified that I am speaking with the correct person using two identifiers. Location patient: home Location nurse: work Persons participating in the telephone visit: patient, nurse.   I discussed the limitations, risks, security and privacy concerns of performing an evaluation and management service by telephone and the availability of in person appointments. I also discussed with the patient that there may be a patient responsible charge related to this service. The patient expressed understanding and verbally consented to this telephonic visit.        Cardiac Risk Factors include: advanced age (>43mn, >>20women);hypertension     Objective:    Today's Vitals   There is no height or weight on file to calculate BMI.  Advanced Directives 01/30/2021 01/28/2020 09/27/2019 01/25/2019 09/22/2017  Does Patient Have a Medical Advance Directive? Yes Yes Yes Yes Yes  Type of AParamedicof ASummitLiving will H[place]azaLiving will Living will HCloudcroftLiving will HPecatonicaLiving will  Does patient want to make changes to medical advance directive? - - - No - Patient declined -  Copy of HRobertsin Chart? No - copy requested No - copy requested - No - copy requested No - copy requested    Current Medications (verified) Outpatient Encounter Medications as of 01/30/2021  Medication Sig   aspirin EC 81 MG tablet Take 81 mg by mouth daily.   CALCIUM PO Take by mouth.   Cholecalciferol (VITAMIN D) 2000 units CAPS Take by mouth.   cyanocobalamin 500 MCG tablet Take 500 mcg by mouth daily.   ezetimibe (ZETIA) 10 MG tablet TAKE 1 TABLET BY MOUTH DAILY. FOR CHOLESTEROL   hydrOXYzine (ATARAX/VISTARIL) 10 MG  tablet Take 1-2 tablets (10-20 mg total) by mouth 2 (two) times daily as needed for anxiety.   loperamide (IMODIUM) 2 MG capsule Taking 1/2 capsule by mouth at bedtime as needed   metoprolol succinate (TOPROL-XL) 50 MG 24 hr tablet Take 1 tablet (50 mg total) by mouth daily.   Omega-3 Fatty Acids (FISH OIL PO) Take 1,200 mg by mouth 2 (two) times daily.   valsartan-hydrochlorothiazide (DIOVAN-HCT) 80-12.5 MG tablet TAKE 1 TABLET BY MOUTH DAILY FOR BLOOD PRESSURE   omeprazole (PRILOSEC) 40 MG capsule TAKE 1 CAPSULE (40 MG TOTAL) BY MOUTH 2 (TWO) TIMES DAILY BEFORE A MEAL. (Patient taking differently: Take 40 mg by mouth 2 (two) times daily before a meal. As needed)   No facility-administered encounter medications on file as of 01/30/2021.    Allergies (verified) Livalo [pitavastatin] and Statins   History: Past Medical History:  Diagnosis Date   Essential hypertension    Fibrocystic breast    Hyperlipidemia    IBS (irritable bowel syndrome)    Idiopathic hematuria    OSA (obstructive sleep apnea)    Past Surgical History:  Procedure Laterality Date   COLONOSCOPY WITH PROPOFOL N/A 09/27/2019   Procedure: COLONOSCOPY WITH PROPOFOL;  Surgeon: VLin Landsman MD;  Location: [facility] ENDOSCOPY;  Service: Gastroenterology;  Laterality: N/A;   ESOPHAGOGASTRODUODENOSCOPY (EGD) WITH PROPOFOL N/A 09/27/2019   Procedure: ESOPHAGOGASTRODUODENOSCOPY (EGD) WITH PROPOFOL;  Surgeon: VLin Landsman MD;  [facility];  Service: Gastroenterology;  Laterality: N/A;   TONSILLECTOMY AND ADENOIDECTOMY  1961   Family History  Problem  Relation Age of Onset   Hypertension Mother    Hyperlipidemia Mother    Alzheimer's disease Mother    Heart disease Father    Heart attack Father    Hyperlipidemia Father    Hypertension Father    Breast cancer Neg Hx    Social History   Socioeconomic History   Marital status: Married    Spouse name: Not on file   Number of children: Not on file    Years of education: Not on file   Highest education level: Not on file  Occupational History   Occupation: retired  Tobacco Use   Smoking status: Never   Smokeless tobacco: Never  Vaping Use   Vaping Use: Never used  Substance and Sexual Activity   Alcohol use: No   Drug use: No   Sexual activity: Yes  Other Topics Concern   Not on file  Social History Narrative   Married.   3 children.   Retired. Once worked a Agricultural engineer.   Enjoys spending time with family.   Social Determinants of Health   Financial Resource Strain: Low Risk    Difficulty of Paying Living Expenses: Not hard at all  Food Insecurity: No Food Insecurity   Worried About Charity fundraiser in the Last Year: Never true   [place] in the Last Year: Never true  Transportation Needs: No Transportation Needs   Lack of Transportation (Medical): No   Lack of Transportation (Non-Medical): No  Physical Activity: Inactive   Days of Exercise per Week: 0 days   Minutes of Exercise per Session: 0 min  Stress: No Stress Concern Present   Feeling of Stress : Not at all  Social Connections: Not on file    Tobacco Counseling Counseling given: Not Answered   Clinical Intake:  Pre-visit preparation completed: Yes  Pain : No/denies pain     Nutritional Risks: None Diabetes: No  How often do you need to have someone help you when you read instructions, pamphlets, or other written materials from your doctor or pharmacy?: 1 - Never  Diabetic: No Nutrition Risk Assessment:  Has the patient had any N/V/D within the last 2 months?  No  Does the patient have any non-healing wounds?  No  Has the patient had any unintentional weight loss or weight gain?  No   Diabetes:  Is the patient diabetic?  No  If diabetic, was a CBG obtained today?   N/A Did the patient bring in their glucometer from home?   N/A How often do you monitor your CBG's? N/A.   Financial Strains and Diabetes Management:  Are you having  any financial strains with the device, your supplies or your medication?  N/A .  Does the patient want to be seen by Chronic Care Management for management of their diabetes?   N/A Would the patient like to be referred to a Nutritionist or for Diabetic Management?   N/A    Interpreter Needed?: No  Information entered by :: CJohnson, RN   Activities of Daily Living In your present state of health, do you have any difficulty performing the following activities: 01/30/2021  Hearing? N  Vision? N  Difficulty concentrating or making decisions? N  Walking or climbing stairs? N  Dressing or bathing? N  Doing errands, shopping? N  Preparing Food and eating ? N  Using the Toilet? N  In the past six months, have you accidently leaked urine? N  Do you have problems with  loss of bowel control? N  Managing your Medications? N  Managing your Finances? N  Housekeeping or managing your Housekeeping? N  Some recent data might be hidden    Patient Care Team: [name], NP as PCP - General (Internal Medicine) Opthamology, [facility] (Ophthalmology)  Indicate any recent Medical Services you may have received from other than [facility] providers in the past year (date may be approximate).     Assessment:   This is a routine wellness examination for E[place].  Hearing/Vision screen Vision Screening - Comments:: Patient gets annual eye exams   Dietary issues and exercise activities discussed: Current Exercise Habits: The patient does not participate in regular exercise at present, Exercise limited by: None identified   Goals Addressed             This Visit's Progress    Patient Stated       01/30/2021, I will maintain and continue medications as prescribed.        Depression Screen PHQ 2/9 Scores 01/30/2021 01/23/2021 01/28/2020 01/25/2019 09/22/2017  PHQ - 2 Score 0 0 0 0 0  PHQ- 9 Score 0 1 0 0 0    Fall Risk Fall Risk  01/30/2021 01/28/2020 06/02/2019 01/25/2019 09/22/2017  Falls  in the past year? 0 0 0 0 No  Comment - - Emmi Telephone Survey: data to providers prior to load - -  Number falls in past yr: 0 0 - - -  Injury with Fall? 0 0 - - -  Risk for fall due to : No Fall Risks Medication side effect - Medication side effect -  Follow up Falls evaluation completed;Falls prevention discussed Falls evaluation completed;Falls prevention discussed - Falls evaluation completed;Falls prevention discussed -    FALL RISK PREVENTION PERTAINING TO THE HOME:  Any stairs in or around the home? Yes  If so, are there any without handrails? No  Home free of loose throw rugs in walkways, pet beds, electrical cords, etc? Yes  Adequate lighting in your home to reduce risk of falls? Yes   ASSISTIVE DEVICES UTILIZED TO PREVENT FALLS:  Life alert? No  Use of a cane, walker or w/c? No  Grab bars in the bathroom? No  Shower chair or bench in shower? No  Elevated toilet seat or a handicapped toilet? No   TIMED UP AND GO:  Was the test performed?  N/A telephone visit .  Cognitive Function: MMSE - Mini Mental State Exam 01/30/2021 01/28/2020 01/25/2019 09/22/2017  Orientation to time '5 5 5 5  '$ Orientation to Place '5 5 5 5  '$ Registration '3 3 3 3  '$ Attention/ Calculation '5 5 5 '$ 0  Recall '3 3 3 3  '$ Language- name 2 objects - - 0 0  Language- repeat '1 1 1 1  '$ Language- follow 3 step command - - 0 3  Language- read & follow direction - - 0 0  Write a sentence - - 0 0  Copy design - - 0 0  Total score - - 22 20  Mini Cog  Mini-Cog screen was completed. Maximum score is 22. A value of 0 denotes this part of the MMSE was not completed or the patient failed this part of the Mini-Cog screening.       Immunizations Immunization History  Administered Date(s) Administered   Fluad Quad(high Dose 65+) 05/25/2020   Influenza, High Dose Seasonal PF 04/08/2018, 04/02/2019   PFIZER(Purple Top)SARS-COV-2 Vaccination 09/19/2019, 10/11/2019   Pneumococcal Conjugate-13 04/30/2016   Pneumococcal  Polysaccharide-23  10/02/2017   Tdap 03/17/2007    TDAP status: Due, Education has been provided regarding the importance of this vaccine. Advised may receive this vaccine at local pharmacy or Health Dept. Aware to provide a copy of the vaccination record if obtained from local pharmacy or Health Dept. Verbalized acceptance and understanding.  Flu Vaccine status: Up to date  Pneumococcal vaccine status: Up to date  Covid-19 vaccine status: Completed 2 vaccines  Qualifies for Shingles Vaccine? Yes   Zostavax completed No   Shingrix Completed?: No.    Education has been provided regarding the importance of this vaccine. Patient has been advised to call insurance company to determine out of pocket expense if they have not yet received this vaccine. Advised may also receive vaccine at local pharmacy or Health Dept. Verbalized acceptance and understanding.  Screening Tests Health Maintenance  Topic Date Due   Zoster Vaccines- Shingrix (1 of 2) Never done   COVID-19 Vaccine (3 - Booster for Pfizer series) 02/08/2021 (Originally 03/12/2020)   TETANUS/TDAP  01/28/2024 (Originally 03/16/2017)   INFLUENZA VACCINE  02/05/2021   MAMMOGRAM  05/24/2022   COLONOSCOPY (Pts 45-38yr Insurance coverage will need to be confirmed)  09/26/2029   DEXA SCAN  Completed   Hepatitis C Screening  Completed   PNA vac Low Risk Adult  Completed   HPV VACCINES  Aged Out    Health Maintenance  Health Maintenance Due  Topic Date Due   Zoster Vaccines- Shingrix (1 of 2) Never done    Colorectal cancer screening: Type of screening: Colonoscopy. Completed 09/27/2019. Repeat every 10 years  Mammogram status: Completed 05/24/2020. Repeat every year  Bone Density status: Completed 05/24/2020. Results reflect: Bone density results: OSTEOPENIA. Repeat every 2 years.  Lung Cancer Screening: (Low Dose CT Chest recommended if Age 69-80years, 30 pack-year currently smoking OR have quit w/in 15years.) does not qualify.    Additional Screening:  Hepatitis C Screening: does qualify; Completed 12/17/2017  Vision Screening: Recommended annual ophthalmology exams for early detection of glaucoma and other disorders of the eye. Is the patient up to date with their annual eye exam?  Yes  Who is the provider or what is the name of the office in which the patient attends [facility] If pt is not established with a provider, would they like to be referred to a provider to establish care? No .   Dental Screening: Recommended annual dental exams for proper oral hygiene  Community Resource Referral / Chronic Care Management: CRR required this visit?  No   CCM required this visit?  No      Plan:     I have personally reviewed and noted the following in the patient's chart:   Medical and social history Use of alcohol, tobacco or illicit drugs  Current medications and supplements including opioid prescriptions.  Functional ability and status Nutritional status Physical activity Advanced directives List of other physicians Hospitalizations, surgeries, and ER visits in previous 12 months Vitals Screenings to include cognitive, depression, and falls Referrals and appointments  In addition, I have reviewed and discussed with patient certain preventive protocols, quality metrics, and best practice recommendations. A written personalized care plan for preventive services as well as general preventive health recommendations were provided to patient.   Due to this being a telephonic visit, the after visit summary with patients personalized plan was offered to patient via office or my-chart. Patient preferred to pick up at office at next visit or via mychart.  Andrez Grime, LPN   [id number]

## 2021-01-30 NOTE — Progress Notes (Signed)
PCP notes:  Health Maintenance: Shingrix- due    Abnormal Screenings: none   Patient concerns: none   Nurse concerns: none   Next PCP appt.: 02/02/2021 @ 8 am

## 2021-01-30 NOTE — Patient Instructions (Signed)
Amy Wilcox , Thank you for taking time to come for your Medicare Wellness Visit. I appreciate your ongoing commitment to your health goals. Please review the following plan we discussed and let me know if I can assist you in the future.   Screening recommendations/referrals: Colonoscopy: Up to date, completed 09/27/2019, due 09/2029 Mammogram: Up to date, completed 05/24/2020, due 05/2021 Bone Density: Up to date, completed 05/24/2020, due 05/2022 Recommended yearly ophthalmology/optometry visit for glaucoma screening and checkup Recommended yearly dental visit for hygiene and checkup  Vaccinations: Influenza vaccine: Up to date, completed 05/25/2020, due 02/2021 Pneumococcal vaccine: Completed series Tdap vaccine: decline-insurance Shingles vaccine: due, check with your insurance regarding coverage if interested    Covid-19:completed 2 vaccines   Advanced directives: Please bring a copy of your POA (Power of Attorney) and/or Living Will to your next appointment.   Conditions/risks identified: hypertension   Next appointment: Follow up in one year for your annual wellness visit    Preventive Care 6 Years and Older, Female Preventive care refers to lifestyle choices and visits with your health care provider that can promote health and wellness. What does preventive care include? A yearly physical exam. This is also called an annual well check. Dental exams once or twice a year. Routine eye exams. Ask your health care provider how often you should have your eyes checked. Personal lifestyle choices, including: Daily care of your teeth and gums. Regular physical activity. Eating a healthy diet. Avoiding tobacco and drug use. Limiting alcohol use. Practicing safe sex. Taking low-dose aspirin every day. Taking vitamin and mineral supplements as recommended by your health care provider. What happens during an annual well check? The services and screenings done by your health care  provider during your annual well check will depend on your age, overall health, lifestyle risk factors, and family history of disease. Counseling  Your health care provider may ask you questions about your: Alcohol use. Tobacco use. Drug use. Emotional well-being. Home and relationship well-being. Sexual activity. Eating habits. History of falls. Memory and ability to understand (cognition). Work and work Statistician. Reproductive health. Screening  You may have the following tests or measurements: Height, weight, and BMI. Blood pressure. Lipid and cholesterol levels. These may be checked every 5 years, or more frequently if you are over 79 years old. Skin check. Lung cancer screening. You may have this screening every year starting at age 26 if you have a 30-pack-year history of smoking and currently smoke or have quit within the past 15 years. Fecal occult blood test (FOBT) of the stool. You may have this test every year starting at age 31. Flexible sigmoidoscopy or colonoscopy. You may have a sigmoidoscopy every 5 years or a colonoscopy every 10 years starting at age 7. Hepatitis C blood test. Hepatitis B blood test. Sexually transmitted disease (STD) testing. Diabetes screening. This is done by checking your blood sugar (glucose) after you have not eaten for a while (fasting). You may have this done every 1-3 years. Bone density scan. This is done to screen for osteoporosis. You may have this done starting at age 101. Mammogram. This may be done every 1-2 years. Talk to your health care provider about how often you should have regular mammograms. Talk with your health care provider about your test results, treatment options, and if necessary, the need for more tests. Vaccines  Your health care provider may recommend certain vaccines, such as: Influenza vaccine. This is recommended every year. Tetanus, diphtheria, and acellular pertussis (Tdap,  Td) vaccine. You may need a Td  booster every 10 years. Zoster vaccine. You may need this after age 15. Pneumococcal 13-valent conjugate (PCV13) vaccine. One dose is recommended after age 64. Pneumococcal polysaccharide (PPSV23) vaccine. One dose is recommended after age 73. Talk to your health care provider about which screenings and vaccines you need and how often you need them. This information is not intended to replace advice given to you by your health care provider. Make sure you discuss any questions you have with your health care provider. Document Released: 07/21/2015 Document Revised: 03/13/2016 Document Reviewed: 04/25/2015 Elsevier Interactive Patient Education  2017 [place] Prevention in the Home Falls can cause injuries. They can happen to people of all ages. There are many things you can do to make your home safe and to help prevent falls. What can I do on the outside of my home? Regularly fix the edges of walkways and driveways and fix any cracks. Remove anything that might make you trip as you walk through a door, such as a raised step or threshold. Trim any bushes or trees on the path to your home. Use bright outdoor lighting. Clear any walking paths of anything that might make someone trip, such as rocks or tools. Regularly check to see if handrails are loose or broken. Make sure that both sides of any steps have handrails. Any raised decks and porches should have guardrails on the edges. Have any leaves, snow, or ice cleared regularly. Use sand or salt on walking paths during winter. Clean up any spills in your garage right away. This includes oil or grease spills. What can I do in the bathroom? Use night lights. Install grab bars by the toilet and in the tub and shower. Do not use towel bars as grab bars. Use non-skid mats or decals in the tub or shower. If you need to sit down in the shower, use a plastic, non-slip stool. Keep the floor dry. Clean up any water that spills on the floor  as soon as it happens. Remove soap buildup in the tub or shower regularly. Attach bath mats securely with double-sided non-slip rug tape. Do not have throw rugs and other things on the floor that can make you trip. What can I do in the bedroom? Use night lights. Make sure that you have a light by your bed that is easy to reach. Do not use any sheets or blankets that are too big for your bed. They should not hang down onto the floor. Have a firm chair that has side arms. You can use this for support while you get dressed. Do not have throw rugs and other things on the floor that can make you trip. What can I do in the kitchen? Clean up any spills right away. Avoid walking on wet floors. Keep items that you use a lot in easy-to-reach places. If you need to reach something above you, use a strong step stool that has a grab bar. Keep electrical cords out of the way. Do not use floor polish or wax that makes floors slippery. If you must use wax, use non-skid floor wax. Do not have throw rugs and other things on the floor that can make you trip. What can I do with my stairs? Do not leave any items on the stairs. Make sure that there are handrails on both sides of the stairs and use them. Fix handrails that are broken or loose. Make sure that handrails are as  long as the stairways. Check any carpeting to make sure that it is firmly attached to the stairs. Fix any carpet that is loose or worn. Avoid having throw rugs at the top or bottom of the stairs. If you do have throw rugs, attach them to the floor with carpet tape. Make sure that you have a light switch at the top of the stairs and the bottom of the stairs. If you do not have them, ask someone to add them for you. What else can I do to help prevent falls? Wear shoes that: Do not have high heels. Have rubber bottoms. Are comfortable and fit you well. Are closed at the toe. Do not wear sandals. If you use a stepladder: Make sure that it is  fully opened. Do not climb a closed stepladder. Make sure that both sides of the stepladder are locked into place. Ask someone to hold it for you, if possible. Clearly mark and make sure that you can see: Any grab bars or handrails. First and last steps. Where the edge of each step is. Use tools that help you move around (mobility aids) if they are needed. These include: Canes. Walkers. Scooters. Crutches. Turn on the lights when you go into a dark area. Replace any light bulbs as soon as they burn out. Set up your furniture so you have a clear path. Avoid moving your furniture around. If any of your floors are uneven, fix them. If there are any pets around you, be aware of where they are. Review your medicines with your doctor. Some medicines can make you feel dizzy. This can increase your chance of falling. Ask your doctor what other things that you can do to help prevent falls. This information is not intended to replace advice given to you by your health care provider. Make sure you discuss any questions you have with your health care provider. Document Released: 04/20/2009 Document Revised: 11/30/2015 Document Reviewed: 07/29/2014 Elsevier Interactive Patient Education  2017 [employer].

## 2021-02-02 ENCOUNTER — Other Ambulatory Visit: Payer: Self-pay

## 2021-02-02 ENCOUNTER — Ambulatory Visit (INDEPENDENT_AMBULATORY_CARE_PROVIDER_SITE_OTHER): Payer: Medicare Other | Admitting: Primary Care

## 2021-02-02 ENCOUNTER — Encounter: Payer: Self-pay | Admitting: Primary Care

## 2021-02-02 VITALS — BP 130/74 | HR 64 | Temp 97.4°F | Ht 62.25 in | Wt 169.0 lb

## 2021-02-02 DIAGNOSIS — F418 Other specified anxiety disorders: Secondary | ICD-10-CM | POA: Diagnosis not present

## 2021-02-02 DIAGNOSIS — Z23 Encounter for immunization: Secondary | ICD-10-CM | POA: Diagnosis not present

## 2021-02-02 DIAGNOSIS — I10 Essential (primary) hypertension: Secondary | ICD-10-CM

## 2021-02-02 DIAGNOSIS — R350 Frequency of micturition: Secondary | ICD-10-CM | POA: Diagnosis not present

## 2021-02-02 DIAGNOSIS — E785 Hyperlipidemia, unspecified: Secondary | ICD-10-CM

## 2021-02-02 DIAGNOSIS — G4733 Obstructive sleep apnea (adult) (pediatric): Secondary | ICD-10-CM

## 2021-02-02 DIAGNOSIS — K2100 Gastro-esophageal reflux disease with esophagitis, without bleeding: Secondary | ICD-10-CM

## 2021-02-02 DIAGNOSIS — R42 Dizziness and giddiness: Secondary | ICD-10-CM

## 2021-02-02 DIAGNOSIS — R7303 Prediabetes: Secondary | ICD-10-CM

## 2021-02-02 DIAGNOSIS — K580 Irritable bowel syndrome with diarrhea: Secondary | ICD-10-CM | POA: Diagnosis not present

## 2021-02-02 DIAGNOSIS — R519 Headache, unspecified: Secondary | ICD-10-CM | POA: Diagnosis not present

## 2021-02-02 DIAGNOSIS — K760 Fatty (change of) liver, not elsewhere classified: Secondary | ICD-10-CM | POA: Diagnosis not present

## 2021-02-02 LAB — COMPREHENSIVE METABOLIC PANEL
ALT: 21 U/L (ref 0–35)
AST: 16 U/L (ref 0–37)
Albumin: 4.3 g/dL (ref 3.5–5.2)
Alkaline Phosphatase: 30 U/L — ABNORMAL LOW (ref 39–117)
BUN: 25 mg/dL — ABNORMAL HIGH (ref 6–23)
CO2: 28 mEq/L (ref 19–32)
Calcium: 9.7 mg/dL (ref 8.4–10.5)
Chloride: 103 mEq/L (ref 96–112)
Creatinine, Ser: 0.85 mg/dL (ref 0.40–1.20)
GFR: 69.91 mL/min (ref >60.00)
Glucose, Bld: 134 mg/dL — ABNORMAL HIGH (ref 70–99)
Potassium: 3.6 mEq/L (ref 3.5–5.1)
Sodium: 140 mEq/L (ref 135–145)
Total Bilirubin: 0.8 mg/dL (ref 0.2–1.2)
Total Protein: 6.9 g/dL (ref 6.0–8.3)

## 2021-02-02 LAB — POC URINALSYSI DIPSTICK (AUTOMATED)
Bilirubin, UA: NEGATIVE
Glucose, UA: NEGATIVE
Ketones, UA: NEGATIVE
Leukocytes, UA: NEGATIVE
Nitrite, UA: NEGATIVE
Protein, UA: NEGATIVE
Spec Grav, UA: >=1.03 — AB (ref 1.010–1.025)
Urobilinogen, UA: 0.2 E.U./dL
pH, UA: 5.5 (ref 5.0–8.0)

## 2021-02-02 LAB — CBC
HCT: 42 % (ref 36.0–46.0)
Hemoglobin: 14.9 g/dL (ref 12.0–15.0)
MCHC: 35.4 g/dL (ref 30.0–36.0)
MCV: 86 fl (ref 78.0–100.0)
Platelets: 209 10*3/uL (ref 150.0–400.0)
RBC: 4.89 Mil/uL (ref 3.87–5.11)
RDW: 12.7 % (ref 11.5–15.5)
WBC: 6.1 10*3/uL (ref 4.0–10.5)

## 2021-02-02 LAB — LIPID PANEL
Cholesterol: 238 mg/dL — ABNORMAL HIGH (ref 0–200)
HDL: 40.9 mg/dL (ref >39.00)
NonHDL: 196.87
Total CHOL/HDL Ratio: 6
Triglycerides: 334 mg/dL — ABNORMAL HIGH (ref 0.0–149.0)
VLDL: 66.8 mg/dL — ABNORMAL HIGH (ref 0.0–40.0)

## 2021-02-02 LAB — HEMOGLOBIN A1C: Hgb A1c MFr Bld: 6.2 % (ref 4.6–6.5)

## 2021-02-02 LAB — LDL CHOLESTEROL, DIRECT: Direct LDL: 150 mg/dL

## 2021-02-02 MED ORDER — ZOSTER VAC RECOMB ADJUVANTED 50 MCG/0.5ML IM SUSR: 0.5000 mL | Freq: Once | INTRAMUSCULAR | 1 refills | Status: AC

## 2021-02-02 NOTE — Assessment & Plan Note (Signed)
Stable and well controlled.  Using Imodium PRN.

## 2021-02-02 NOTE — Progress Notes (Signed)
Subjective:    Patient ID: Amy Wilcox, female    DOB: [birth year], 70 y.o.   MRN: [id number]  HPI  Amy Wilcox is a very pleasant 70 y.o. female who presents today for [place] Part 2 and follow up of chronic conditions.   Overall doing well. Her dizziness from last visit has abated.   Chronic daily headaches for years which are located to the parietal and frontal regions, both sides. She describes headaches as "dull". She takes Tylenol Migraine once weekly. She does notice sensitivity to light and sound at times with headaches. She denies a family history of brain aneurysm. Her son was thought to have a brain tumor but this was not evident on MRI. She's never been treated for headaches.   Urinary urgency, suprapubic pressure, and frequency for the last 2-3 days. She denies hematuria, dysuria, abdominal pain.   Immunizations: -Influenza: Due this season  -Covid-19: 2 vaccines  -Shingles: Never completed -Pneumonia: Prevnar 13 in 2017, Pneumovax in 2019  Mammogram: Completed in November 2021 Dexa: Completed in November 2021 Colonoscopy: Completed in 2021, due 2031  BP Readings from Last 3 Encounters:  02/02/21 130/74  01/23/21 130/68  02/03/20 (!) 130/82      Review of Systems  Eyes:  Negative for visual disturbance.  Respiratory:  Negative for shortness of breath.   Cardiovascular:  Negative for chest pain.  Gastrointestinal:  Negative for abdominal pain, constipation and diarrhea.  Genitourinary:  Positive for frequency and urgency.  Neurological:  Positive for headaches. Negative for dizziness.  Psychiatric/Behavioral:  The patient is not nervous/anxious.         Past Medical History:  Diagnosis Date   Essential hypertension    Fibrocystic breast    Hyperlipidemia    IBS (irritable bowel syndrome)    Idiopathic hematuria    OSA (obstructive sleep apnea)     Social History   Socioeconomic History   Marital status: Married    Spouse name: Not on file   Number of  children: Not on file   Years of education: Not on file   Highest education level: Not on file  Occupational History   Occupation: retired  Tobacco Use   Smoking status: Never   Smokeless tobacco: Never  Vaping Use   Vaping Use: Never used  Substance and Sexual Activity   Alcohol use: No   Drug use: No   Sexual activity: Yes  Other Topics Concern   Not on file  Social History Narrative   Married.   3 children.   Retired. Once worked a Agricultural engineer.   Enjoys spending time with family.   Social Determinants of Health   Financial Resource Strain: Low Risk    Difficulty of Paying Living Expenses: Not hard at all  Food Insecurity: No Food Insecurity   Worried About Charity fundraiser in the Last Year: Never true   Weed in the Last Year: Never true  Transportation Needs: No Transportation Needs   Lack of Transportation (Medical): No   Lack of Transportation (Non-Medical): No  Physical Activity: Inactive   Days of Exercise per Week: 0 days   Minutes of Exercise per Session: 0 min  Stress: No Stress Concern Present   Feeling of Stress : Not at all  Social Connections: Not on file  Intimate Partner Violence: Not At Risk   Fear of Current or Ex-Partner: No   Emotionally Abused: No   Physically Abused: No   Sexually Abused: No  Past Surgical History:  Procedure Laterality Date   COLONOSCOPY WITH PROPOFOL N/A 09/27/2019   Procedure: COLONOSCOPY WITH PROPOFOL;  Surgeon: Lin Landsman, MD;  Location: [facility] ENDOSCOPY;  Service: Gastroenterology;  Laterality: N/A;   ESOPHAGOGASTRODUODENOSCOPY (EGD) WITH PROPOFOL N/A 09/27/2019   Procedure: ESOPHAGOGASTRODUODENOSCOPY (EGD) WITH PROPOFOL;  Surgeon: Lin Landsman, MD;  Location: [facility] ENDOSCOPY;  Service: Gastroenterology;  Laterality: N/A;   TONSILLECTOMY AND ADENOIDECTOMY  1961    Family History  Problem Relation Age of Onset   Hypertension Mother    Hyperlipidemia Mother    Alzheimer's disease Mother     Heart disease Father    Heart attack Father    Hyperlipidemia Father    Hypertension Father    Breast cancer Neg Hx     Allergies  Allergen Reactions   Livalo [Pitavastatin]    Statins     Current Outpatient Medications on File Prior to Visit  Medication Sig Dispense Refill   aspirin EC 81 MG tablet Take 81 mg by mouth daily.     CALCIUM PO Take by mouth.     Cholecalciferol (VITAMIN D) 2000 units CAPS Take by mouth.     cyanocobalamin 500 MCG tablet Take 500 mcg by mouth daily.     ezetimibe (ZETIA) 10 MG tablet TAKE 1 TABLET BY MOUTH DAILY. FOR CHOLESTEROL 90 tablet 0   hydrOXYzine (ATARAX/VISTARIL) 10 MG tablet Take 1-2 tablets (10-20 mg total) by mouth 2 (two) times daily as needed for anxiety. 30 tablet 0   loperamide (IMODIUM) 2 MG capsule Taking 1/2 capsule by mouth at bedtime as needed     metoprolol succinate (TOPROL-XL) 50 MG 24 hr tablet Take 1 tablet (50 mg total) by mouth daily. 90 tablet 0   Omega-3 Fatty Acids (FISH OIL PO) Take 1,200 mg by mouth 2 (two) times daily.     valsartan-hydrochlorothiazide (DIOVAN-HCT) 80-12.5 MG tablet TAKE 1 TABLET BY MOUTH DAILY FOR BLOOD PRESSURE 90 tablet 0   No current facility-administered medications on file prior to visit.    BP 130/74 (BP Location: Left Arm, Patient Position: Sitting, Cuff Size: Large)   Pulse 64   Temp (!) 97.4 F (36.3 C)   Ht 5' 2.25" (1.581 m)   Wt 169 lb (76.7 kg)   SpO2 98%   BMI 30.66 kg/m  Objective:   Physical Exam Constitutional:      Appearance: She is not ill-appearing.  Cardiovascular:     Rate and Rhythm: Normal rate and regular rhythm.  Pulmonary:     Effort: Pulmonary effort is normal.     Breath sounds: Normal breath sounds.  Abdominal:     Palpations: Abdomen is soft.     Tenderness: There is no abdominal tenderness.  Musculoskeletal:     Cervical back: Neck supple.  Skin:    General: Skin is warm and dry.  Neurological:     Mental Status: She is alert.  Psychiatric:         Mood and Affect: Mood normal.          Assessment & Plan:      This visit occurred during the SARS-CoV-2 public health emergency.  Safety protocols were in place, including screening questions prior to the visit, additional usage of staff PPE, and extensive cleaning of exam room while observing appropriate contact time as indicated for disinfecting solutions.

## 2021-02-02 NOTE — Assessment & Plan Note (Signed)
Discussed the importance of a healthy diet and regular exercise in order for weight loss, and to reduce the risk of further co-morbidity. ? ?Repeat A1C pending. ?

## 2021-02-02 NOTE — Assessment & Plan Note (Signed)
Chronic for years, stable. Offered daily preventative treatment for which she kindly declines. She will update if she changes her mind.

## 2021-02-02 NOTE — Assessment & Plan Note (Signed)
Doing well on hydroxyzine 10 mg for which she uses sparingly. Continue same.

## 2021-02-02 NOTE — Patient Instructions (Signed)
Take the Shingles vaccine to the pharmacy for administration.  Notify me if you decide to pursue headache treatment.   Stop by the lab prior to leaving today. I will notify you of your results once received.   It was a pleasure to see you today!

## 2021-02-02 NOTE — Assessment & Plan Note (Signed)
Compliant to Zetia 10 mg, continue same.  Repeat lipid panel pending.

## 2021-02-02 NOTE — Assessment & Plan Note (Signed)
Acute for the last 2-3 days with urgency and suprapubic pressure.   UA today with trace blood, otherwise negative. Culture sent. Await results.

## 2021-02-02 NOTE — Assessment & Plan Note (Signed)
Controlled in the office today, continue metoprolol succinate 50 mg, valsartan-HCTZ 80-12.5 mg. CMP pending.

## 2021-02-02 NOTE — Assessment & Plan Note (Signed)
Compliant to CPAP nightly, continue same.  

## 2021-02-02 NOTE — Assessment & Plan Note (Signed)
No longer seeing GI as her IBS has been under control. Repeat liver enzymes pending.

## 2021-02-02 NOTE — Assessment & Plan Note (Signed)
No concerns for GERD. No longer on daily omeprazole, using only PRN. Continue same.

## 2021-02-02 NOTE — Assessment & Plan Note (Signed)
Resolved since last visit.  No concerns today.

## 2021-02-03 LAB — URINE CULTURE
MICRO NUMBER:: 12179834
Result:: NO GROWTH
SPECIMEN QUALITY:: ADEQUATE

## 2021-02-07 ENCOUNTER — Telehealth: Payer: Self-pay | Admitting: *Deleted

## 2021-02-07 NOTE — Telephone Encounter (Signed)
Noted, will evaluate. 

## 2021-02-07 NOTE — Telephone Encounter (Signed)
While on the phone with patient's husband he stated that his wife had a positive covid test today also.  Patient scheduled for a virtual visit with [name] NP tomorrow at 3:00 pm. Patient's husband stated that he will have his wife send a my chart message to [facility] with her symptoms and a picture of her positive test.Patient's husband stated at this point their symptoms are mild. Patient's husband was given ER precautions for the both of them and he verbalized understanding.

## 2021-02-08 ENCOUNTER — Encounter: Payer: Self-pay | Admitting: Primary Care

## 2021-02-08 ENCOUNTER — Telehealth (INDEPENDENT_AMBULATORY_CARE_PROVIDER_SITE_OTHER): Payer: Medicare Other | Admitting: Primary Care

## 2021-02-08 DIAGNOSIS — U071 COVID-19: Secondary | ICD-10-CM | POA: Diagnosis not present

## 2021-02-08 NOTE — Assessment & Plan Note (Signed)
Positive test this morning, within treatment window. She is improved today. Discussed that if symptoms return/progress then would recommend Paxloid regimen. She agrees and will update tomorrow.  She appears well during exam.

## 2021-02-08 NOTE — Progress Notes (Signed)
Patient ID: Amy Wilcox, female    DOB: [birth year], 70 y.o.   MRN: [id number]  Virtual visit completed through [employer], a video enabled telemedicine application. Due to national recommendations of social distancing due to COVID-19, a virtual visit is felt to be most appropriate for this patient at this time. Reviewed limitations, risks, security and privacy concerns of performing a virtual visit and the availability of in person appointments. I also reviewed that there may be a patient responsible charge related to this service. The patient agreed to proceed.   Patient location: home Provider location: [place] [facility], office Persons participating in this virtual visit: patient, provider   If any vitals were documented, they were collected by patient at home unless specified below.    BP 114/72   Pulse 87   Temp (!) 101 F (38.3 C) (Temporal)   Ht 5' 2.5" (1.588 m)   Wt 169 lb (76.7 kg)   BMI 30.42 kg/m    CC: Positive Covid Subjective:   HPI: Amy Wilcox is a 70 y.o. female presenting on 02/08/2021 for positive Covid infection.  Symptom onset two days ago with joint aches. Since then she's developed sore throat, cough, fevers. Fever of 101 this morning. She tested positive for Covid-19 this morning through a home test, three prior negative Covid-19 tests yesterday.   She's had 2 Covid-19 vaccines. She's taken Extra Strength Tylenol and OTC cough suppressant with improvement. Husband has same symptoms.       Relevant past medical, surgical, family and social history reviewed and updated as indicated. Interim medical history since our last visit reviewed. Allergies and medications reviewed and updated. Outpatient Medications Prior to Visit  Medication Sig Dispense Refill   aspirin EC 81 MG tablet Take 81 mg by mouth daily.     CALCIUM PO Take by mouth.     Cholecalciferol (VITAMIN D) 2000 units CAPS Take by mouth.     cyanocobalamin 500 MCG tablet Take 500 mcg by mouth  daily.     ezetimibe (ZETIA) 10 MG tablet TAKE 1 TABLET BY MOUTH DAILY. FOR CHOLESTEROL 90 tablet 0   hydrOXYzine (ATARAX/VISTARIL) 10 MG tablet Take 1-2 tablets (10-20 mg total) by mouth 2 (two) times daily as needed for anxiety. 30 tablet 0   loperamide (IMODIUM) 2 MG capsule Taking 1/2 capsule by mouth at bedtime as needed     metoprolol succinate (TOPROL-XL) 50 MG 24 hr tablet Take 1 tablet (50 mg total) by mouth daily. 90 tablet 0   Omega-3 Fatty Acids (FISH OIL PO) Take 1,200 mg by mouth 2 (two) times daily.     valsartan-hydrochlorothiazide (DIOVAN-HCT) 80-12.5 MG tablet TAKE 1 TABLET BY MOUTH DAILY FOR BLOOD PRESSURE 90 tablet 0   No facility-administered medications prior to visit.     Per HPI unless specifically indicated in ROS section below Review of Systems Objective:  BP 114/72   Pulse 87   Temp (!) 101 F (38.3 C) (Temporal)   Ht 5' 2.5" (1.588 m)   Wt 169 lb (76.7 kg)   BMI 30.42 kg/m   Wt Readings from Last 3 Encounters:  02/08/21 169 lb (76.7 kg)  02/02/21 169 lb (76.7 kg)  01/23/21 171 lb (77.6 kg)       Physical exam: Gen: alert, NAD, not ill appearing Pulm: speaks in complete sentences without increased work of breathing, no cough during visit. Psych: normal mood, normal thought content      Results for orders placed or performed  in visit on 02/02/21  Urine Culture   Specimen: Blood  Result Value Ref Range   MICRO NUMBER: [id number]    SPECIMEN QUALITY: Adequate    Sample Source NOT GIVEN    STATUS: FINAL    Result: No Growth   Lipid panel  Result Value Ref Range   Cholesterol 238 (H) 0 - 200 mg/dL   Triglycerides 334.0 (H) 0.0 - 149.0 mg/dL   HDL 40.90 >39.00 mg/dL   VLDL 66.8 (H) 0.0 - 40.0 mg/dL   Total CHOL/HDL Ratio 6    NonHDL 196.87   Hemoglobin A1c  Result Value Ref Range   Hgb A1c MFr Bld 6.2 4.6 - 6.5 %  Comprehensive metabolic panel  Result Value Ref Range   Sodium 140 135 - 145 mEq/L   Potassium 3.6 3.5 - 5.1 mEq/L   Chloride  103 96 - 112 mEq/L   CO2 28 19 - 32 mEq/L   Glucose, Bld 134 (H) 70 - 99 mg/dL   BUN 25 (H) 6 - 23 mg/dL   Creatinine, Ser 0.85 0.40 - 1.20 mg/dL   Total Bilirubin 0.8 0.2 - 1.2 mg/dL   Alkaline Phosphatase 30 (L) 39 - 117 U/L   AST 16 0 - 37 U/L   ALT 21 0 - 35 U/L   Total Protein 6.9 6.0 - 8.3 g/dL   Albumin 4.3 3.5 - 5.2 g/dL   GFR 69.91 >60.00 mL/min   Calcium 9.7 8.4 - 10.5 mg/dL  CBC  Result Value Ref Range   WBC 6.1 4.0 - 10.5 K/uL   RBC 4.89 3.87 - 5.11 Mil/uL   Platelets 209.0 150.0 - 400.0 K/uL   Hemoglobin 14.9 12.0 - 15.0 g/dL   HCT 42.0 36.0 - 46.0 %   MCV 86.0 78.0 - 100.0 fl   MCHC 35.4 30.0 - 36.0 g/dL   RDW 12.7 11.5 - 15.5 %  LDL cholesterol, direct  Result Value Ref Range   Direct LDL 150.0 mg/dL  POCT Urinalysis Dipstick (Automated)  Result Value Ref Range   Color, UA light yellow    Clarity, UA clear    Glucose, UA Negative Negative   Bilirubin, UA negative    Ketones, UA negative    Spec Grav, UA >=1.030 (A) 1.010 - 1.025   Blood, UA trace    pH, UA 5.5 5.0 - 8.0   Protein, UA Negative Negative   Urobilinogen, UA 0.2 0.2 or 1.0 E.U./dL   Nitrite, UA negative    Leukocytes, UA Negative Negative   Assessment & Plan:   Problem List Items Addressed This Visit       Other   COVID-19 virus infection    Positive test this morning, within treatment window. She is improved today. Discussed that if symptoms return/progress then would recommend Paxloid regimen. She agrees and will update tomorrow.  She appears well during exam.          No orders of the defined types were placed in this encounter.  No orders of the defined types were placed in this encounter.   I discussed the assessment and treatment plan with the patient. The patient was provided an opportunity to ask questions and all were answered. The patient agreed with the plan and demonstrated an understanding of the instructions. The patient was advised to call back or seek an in-person  evaluation if the symptoms worsen or if the condition fails to improve as anticipated.  Follow up plan:  Please notify me if your symptoms  return/progress!  It was a pleasure to see you today!   [name], NP

## 2021-02-09 ENCOUNTER — Telehealth: Payer: Self-pay

## 2021-02-09 MED ORDER — BENZONATATE 200 MG PO CAPS: 200.0000 mg | ORAL_CAPSULE | Freq: Three times a day (TID) | ORAL | 0 refills | Status: DC | PRN

## 2021-02-09 NOTE — Telephone Encounter (Signed)
Responded via mychart

## 2021-02-09 NOTE — Telephone Encounter (Signed)
Pt had video visit on 02/08/21; pt tested + covid 02/07/21 symptoms started on 02/06/21. Now pt has prod cough with pinkish phlegm and pt started with S/T that really hurts to swallow on 02/08/21. Pt said overnight the cough has worsened to continuous hacking cough. No fever,SOB or CP. No vomitng or diarrhea. [facility]. Pt request cb when reviewed.

## 2021-02-20 ENCOUNTER — Other Ambulatory Visit: Payer: Self-pay

## 2021-02-20 ENCOUNTER — Ambulatory Visit
Admission: EM | Admit: 2021-02-20 | Discharge: 2021-02-20 | Disposition: A | Discharge status: Home / Self Care | Payer: Medicare Other | Attending: Internal Medicine | Admitting: Internal Medicine

## 2021-02-20 DIAGNOSIS — M1000 Idiopathic gout, unspecified site: Secondary | ICD-10-CM

## 2021-02-20 MED ORDER — COLCHICINE 0.6 MG PO TABS: ORAL_TABLET | ORAL | 0 refills | Status: DC

## 2021-02-20 MED ORDER — INDOMETHACIN 50 MG PO CAPS: 50.0000 mg | ORAL_CAPSULE | Freq: Three times a day (TID) | ORAL | 0 refills | Status: DC

## 2021-02-20 NOTE — ED Triage Notes (Signed)
Pt reports having L foot pain and swelling since Sunday. No known injury to foot.

## 2021-02-20 NOTE — Telephone Encounter (Signed)
Called and spoke to patient no office visit open at our office. Patient would like to go to urgent care to have evaluation. Gave information to [facility] urgent care on [address] in [place].

## 2021-02-20 NOTE — ED Provider Notes (Signed)
[facility]    CSN: [id number] Arrival date & time: 02/20/21  [id number]      History   Chief Complaint Chief Complaint  Patient presents with   Foot Pain    HPI Amy Wilcox is a 70 y.o. female onset of R medial foot pain since yesterday and worse to walk on. Never had this before. Has been drinking a lot of peach tea. Denies trauma. Her son has hx of gout.     Past Medical History:  Diagnosis Date   Essential hypertension    Fibrocystic breast    Hyperlipidemia    IBS (irritable bowel syndrome)    Idiopathic hematuria    OSA (obstructive sleep apnea)     Patient Active Problem List   Diagnosis Date Noted   COVID-19 virus infection 02/08/2021   Frequent headaches 02/02/2021   Urinary frequency 02/02/2021   Benign essential hematuria 02/03/2020   Gastroesophageal reflux disease with esophagitis without hemorrhage    Situational anxiety 11/03/2018   Dizziness 05/11/2018   Fibrosis of liver 04/03/2018   OSA (obstructive sleep apnea) 09/29/2017   Prediabetes [id number]   Non-alcoholic fatty liver disease 05/18/2017   Essential hypertension 02/20/2017   Hyperlipidemia 02/20/2017   IBS (irritable bowel syndrome) 02/20/2017    Past Surgical History:  Procedure Laterality Date   COLONOSCOPY WITH PROPOFOL N/A 09/27/2019   Procedure: COLONOSCOPY WITH PROPOFOL;  Surgeon: Lin Landsman, MD;  Location: [place];  Service: Gastroenterology;  Laterality: N/A;   ESOPHAGOGASTRODUODENOSCOPY (EGD) WITH PROPOFOL N/A 09/27/2019   Procedure: ESOPHAGOGASTRODUODENOSCOPY (EGD) WITH PROPOFOL;  Surgeon: Lin Landsman, MD;  Location: [facility] ENDOSCOPY;  Service: Gastroenterology;  Laterality: N/A;   TONSILLECTOMY AND ADENOIDECTOMY  1961    OB History   No obstetric history on file.      Home Medications    Prior to Admission medications   Medication Sig Start Date End Date Taking? Authorizing Provider  colchicine 0.6 MG tablet Take 2 at first and repeat one  tablet in one hour 02/20/21  Yes Rodriguez-Southworth, Sunday Spillers, PA-C  indomethacin (INDOCIN) 50 MG capsule Take 1 capsule (50 mg total) by mouth 3 (three) times daily with meals. 02/20/21  Yes Rodriguez-Southworth, Sandrea Matte  aspirin EC 81 MG tablet Take 81 mg by mouth daily.    [provider]  benzonatate (TESSALON) 200 MG capsule Take 1 capsule (200 mg total) by mouth 3 (three) times daily as needed for cough. 02/09/21   [name], NP  CALCIUM PO Take by mouth.    [provider]  Cholecalciferol (VITAMIN D) 2000 units CAPS Take by mouth.    [provider]  cyanocobalamin 500 MCG tablet Take 500 mcg by mouth daily.    [provider]  ezetimibe (ZETIA) 10 MG tablet TAKE 1 TABLET BY MOUTH DAILY. FOR CHOLESTEROL 12/19/20   [name], NP  hydrOXYzine (ATARAX/VISTARIL) 10 MG tablet Take 1-2 tablets (10-20 mg total) by mouth 2 (two) times daily as needed for anxiety. 01/23/21   [name], NP  loperamide (IMODIUM) 2 MG capsule Taking 1/2 capsule by mouth at bedtime as needed    [provider]  metoprolol succinate (TOPROL-XL) 50 MG 24 hr tablet Take 1 tablet (50 mg total) by mouth daily. 12/19/20   [name], NP  Omega-3 Fatty Acids (FISH OIL PO) Take 1,200 mg by mouth 2 (two) times daily.    [provider]  valsartan-hydrochlorothiazide (DIOVAN-HCT) 80-12.5 MG tablet TAKE 1 TABLET BY MOUTH DAILY  FOR BLOOD PRESSURE 12/07/20   [name], NP    Family History Family History  Problem Relation Age of Onset   Hypertension Mother    Hyperlipidemia Mother    Alzheimer's disease Mother    Heart disease Father    Heart attack Father    Hyperlipidemia Father    Hypertension Father    Breast cancer Neg Hx     Social History Social History   Tobacco Use   Smoking status: Never   Smokeless tobacco: Never  Vaping Use   Vaping Use: Never used  Substance Use Topics   Alcohol use: No   Drug use: No      Allergies   Livalo [pitavastatin] and Statins   Review of Systems Review of Systems  Constitutional:  Negative for fever.  Musculoskeletal:  Positive for arthralgias and gait problem.  Skin:  Negative for color change, pallor, rash and wound.    Physical Exam Triage Vital Signs ED Triage Vitals  Enc Vitals Group     BP 02/20/21 0920 (!) 156/91     Pulse Rate 02/20/21 0920 66     Resp 02/20/21 0920 18     Temp 02/20/21 0920 98.2 F (36.8 C)     Temp Source 02/20/21 0920 Oral     SpO2 02/20/21 0920 98 %     Weight 02/20/21 0921 166 lb (75.3 kg)     Height 02/20/21 0921 5' 2.5" (1.588 m)     Head Circumference --      Peak Flow --      Pain Score 02/20/21 0921 6     Pain Loc --      Pain Edu? --      Excl. in [place]? --    No data found.  Updated Vital Signs BP (!) 156/91   Pulse 66   Temp 98.2 F (36.8 C) (Oral)   Resp 18   Ht 5' 2.5" (1.588 m)   Wt 166 lb (75.3 kg)   SpO2 98%   BMI 29.88 kg/m   Visual Acuity Right Eye Distance:   Left Eye Distance:   Bilateral Distance:    Right Eye Near:   Left Eye Near:    Bilateral Near:     Physical Exam Vitals reviewed.  Constitutional:      General: She is not in acute distress. HENT:     Head: Normocephalic.     Right Ear: External ear normal.     Left Ear: External ear normal.  Eyes:     General: No scleral icterus.    Conjunctiva/sclera: Conjunctivae normal.  Pulmonary:     Effort: Pulmonary effort is normal.  Musculoskeletal:        General: Normal range of motion.     Cervical back: Neck supple.     Comments: L FOOT- with warmth , and mild swelling and tenderness on distal 1st metatarsal region. It is not erythematous. ROM of toes are normal. Ankle is normal.   Skin:    General: Skin is warm and dry.  Neurological:     Mental Status: She is alert and oriented to person, place, and time.  Psychiatric:        Mood and Affect: Mood normal.        Behavior: Behavior normal.        Thought  Content: Thought content normal.        Judgment: Judgment normal.     UC Treatments / Results  Labs (all labs  ordered are listed, but only abnormal results are displayed) Labs Reviewed - No data to display  EKG   Radiology No results found.  Procedures Procedures (including critical care time)  Medications Ordered in UC Medications - No data to display  Initial Impression / Assessment and Plan / UC Course  I have reviewed the triage vital signs and the nursing notes. I suspect she has gout.  I placed her on on Colcrys and Indocin as noted     Final Clinical Impressions(s) / UC Diagnoses   Final diagnoses:  Acute idiopathic gout, unspecified site   Discharge Instructions   None    ED Prescriptions     Medication Sig Dispense Auth. Provider   colchicine 0.6 MG tablet Take 2 at first and repeat one tablet in one hour 3 tablet Rodriguez-Southworth, Sunday Spillers, PA-C   indomethacin (INDOCIN) 50 MG capsule Take 1 capsule (50 mg total) by mouth 3 (three) times daily with meals. 30 capsule Rodriguez-Southworth, Sunday Spillers, PA-C      PDMP not reviewed this encounter.   Shelby Mattocks, PA-C 02/20/21 1801

## 2021-03-09 ENCOUNTER — Other Ambulatory Visit: Payer: Self-pay | Admitting: Primary Care

## 2021-03-09 DIAGNOSIS — I10 Essential (primary) hypertension: Secondary | ICD-10-CM

## 2021-03-23 ENCOUNTER — Other Ambulatory Visit: Payer: Self-pay | Admitting: Primary Care

## 2021-03-23 DIAGNOSIS — E785 Hyperlipidemia, unspecified: Secondary | ICD-10-CM

## 2021-03-23 DIAGNOSIS — I10 Essential (primary) hypertension: Secondary | ICD-10-CM

## 2021-04-23 ENCOUNTER — Ambulatory Visit: Payer: Medicare Other | Admitting: Gastroenterology

## 2021-04-26 ENCOUNTER — Other Ambulatory Visit: Payer: Self-pay

## 2021-04-26 ENCOUNTER — Encounter: Payer: Self-pay | Admitting: Dermatology

## 2021-04-26 ENCOUNTER — Ambulatory Visit (INDEPENDENT_AMBULATORY_CARE_PROVIDER_SITE_OTHER): Payer: Medicare Other | Admitting: Dermatology

## 2021-04-26 DIAGNOSIS — L814 Other melanin hyperpigmentation: Secondary | ICD-10-CM | POA: Diagnosis not present

## 2021-04-26 DIAGNOSIS — L578 Other skin changes due to chronic exposure to nonionizing radiation: Secondary | ICD-10-CM | POA: Diagnosis not present

## 2021-04-26 DIAGNOSIS — Z1283 Encounter for screening for malignant neoplasm of skin: Secondary | ICD-10-CM | POA: Diagnosis not present

## 2021-04-26 DIAGNOSIS — D1800 Hemangioma unspecified site: Secondary | ICD-10-CM

## 2021-04-26 DIAGNOSIS — D225 Melanocytic nevi of trunk: Secondary | ICD-10-CM | POA: Diagnosis not present

## 2021-04-26 DIAGNOSIS — L821 Other seborrheic keratosis: Secondary | ICD-10-CM

## 2021-04-26 DIAGNOSIS — L719 Rosacea, unspecified: Secondary | ICD-10-CM

## 2021-04-26 DIAGNOSIS — B360 Pityriasis versicolor: Secondary | ICD-10-CM | POA: Diagnosis not present

## 2021-04-26 DIAGNOSIS — D229 Melanocytic nevi, unspecified: Secondary | ICD-10-CM | POA: Diagnosis not present

## 2021-04-26 MED ORDER — RHOFADE 1 % EX CREA: TOPICAL_CREAM | CUTANEOUS | 3 refills | Status: DC

## 2021-04-26 MED ORDER — IVERMECTIN 1 % EX CREA
TOPICAL_CREAM | CUTANEOUS | 3 refills | Status: DC
Start: 2021-04-26 — End: 2021-05-23

## 2021-04-26 NOTE — Patient Instructions (Addendum)
Recommend taking Heliocare sun protection supplement daily in sunny weather for additional sun protection. For maximum protection on the sunniest days, you can take up to 2 capsules of regular Heliocare OR take 1 capsule of Heliocare Ultra. For prolonged exposure (such as a full day in the sun), you can repeat your dose of the supplement 4 hours after your first dose. Heliocare can be purchased at [facility] or at [email or web address].  Mole A mole is a colored (pigmented) growth on the skin. Moles are very common. They are usually harmless, but some moles can become cancerous over time. What are the causes? Moles are caused when pigmented skin cells grow together in clusters instead of spreading out in the skin as they normally do. The reason why the skin cells grow together in clusters is not known. What increases the risk? You are more likely to develop a mole if you: Have family members who have moles. Are white. Have blond hair. Are often outdoors and exposed to the sun. Received phototherapy when you were a newborn baby. Are female. What are the signs or symptoms? A mole may be: [place] or black. Flat or raised. Smooth or wrinkled. How is this diagnosed? A mole is diagnosed with a skin exam. If your health care provider thinks a mole may be cancerous, all or part of the mole will be removed for testing (biopsy). How is this treated? Most moles are noncancerous (benign) and do not require treatment. If a mole is found to be cancerous, it will be removed. You may also choose to have a mole removed if it is causing pain or if you do not like the way it looks. Follow these instructions at home: General instructions  Every month, look for new moles and check your existing moles for changes. This is important because a change in a mole can mean that the mole has become cancerous. ABCDE changes in a mole indicate that you should be evaluated by your health care provider. ABCDE stands  for: Asymmetry. This means the mole has an irregular shape. It is not round or oval. Border. This means the mole has an irregular or bumpy border. Color. This means the mole has multiple colors in it, including brown, black, blue, red, or tan. Note that it is normal for moles to get darker when a woman is pregnant or takes birth control pills. Diameter. This means the mole is more than 0.2 inches (6 mm) across. Evolving. This refers to any unusual changes or symptoms in the mole, such as pain, itching, stinging, sensitivity, or bleeding. If you have a large number of moles, see a skin doctor (dermatologist) at least one time every year for a full-body skin check. Lifestyle  When you are outdoors, wear sunscreen with SPF 30 (sun protection factor 30) or higher. Use an adequate amount of sunscreen to cover exposed areas of skin. Put it on 30 minutes before you go out. Reapply it every 2 hours or anytime you come out of the water. When you are out in the sun, wear a broad-brimmed hat and clothing that covers your arms and legs. Wear wraparound sunglasses. Contact a health care provider if: The size, shape, borders, or color of your mole changes. Your mole, or the skin near the mole, becomes painful, sore, red, or swollen. Your mole: Develops more than one color. Itches or bleeds. Becomes scaly, sheds skin, or oozes fluid. Becomes flat or develops raised areas. Becomes hard or soft. You develop a new  mole. Summary A mole is a colored (pigmented) growth on the skin. Moles are very common. They are usually harmless, but some moles can become cancerous over time. Every month, look for new moles and check your existing moles for changes. This is important because a change in a mole can mean that the mole has become cancerous. If you have a large number of moles, see a skin doctor (dermatologist) at least one time every year for a full-body skin check. When you are outdoors, wear sunscreen with SPF  30 (sun protection factor 30) or higher. Reapply it every 2 hours or anytime you come out of the water. Contact a health care provider if you notice changes in a mole or if you develop a new mole. This information is not intended to replace advice given to you by your health care provider. Make sure you discuss any questions you have with your health care provider. Document Revised: 01/28/2019 Document Reviewed: 11/18/2017 Elsevier Patient Education  2022 [employer].   If you have any questions or concerns for your doctor, please call our main line at [phone] and press option 4 to reach your doctor's medical assistant. If no one answers, please leave a voicemail as directed and we will return your call as soon as possible. Messages left after 4 pm will be answered the following business day.   You may also send [place] a message via [place]. We typically respond to MyChart messages within 1-2 business days.  For prescription refills, please ask your pharmacy to contact our office. Our fax number is [phone].  If you have an urgent issue when the clinic is closed that cannot wait until the next business day, you can page your doctor at the number below.    Please note that while we do our best to be available for urgent issues outside of office hours, we are not available 24/7.   If you have an urgent issue and are unable to reach [place], you may choose to seek medical care at your doctor's office, retail clinic, urgent care center, or emergency room.  If you have a medical emergency, please immediately call 911 or go to the emergency department.  Pager Numbers  - Dr. Nehemiah Massed: [phone]  - Dr. Laurence Ferrari: [phone]  - Dr. Nicole Kindred: [phone]  In the event of inclement weather, please call our main line at [phone] for an update on the status of any delays or closures.  Dermatology Medication Tips: Please keep the boxes that topical medications come in in order to help keep  track of the instructions about where and how to use these. Pharmacies typically print the medication instructions only on the boxes and not directly on the medication tubes.   If your medication is too expensive, please contact our office at [phone] option 4 or send [place] a message through [place].   We are unable to tell what your co-pay for medications will be in advance as this is different depending on your insurance coverage. However, we may be able to find a substitute medication at lower cost or fill out paperwork to get insurance to cover a needed medication.   If a prior authorization is required to get your medication covered by your insurance company, please allow [place] 1-2 business days to complete this process.  Drug prices often vary depending on where the prescription is filled and some pharmacies may offer cheaper prices.  The website www.goodrx.com contains coupons for medications through different pharmacies. The prices here do  not account for what the cost may be with help from insurance (it may be cheaper with your insurance), but the website can give you the price if you did not use any insurance.  - You can print the associated coupon and take it with your prescription to the pharmacy.  - You may also stop by our office during regular business hours and pick up a GoodRx coupon card.  - If you need your prescription sent electronically to a different pharmacy, notify our office through [facility] or by phone at [phone] option 4.

## 2021-04-26 NOTE — Progress Notes (Signed)
New Patient Visit  Subjective  Amy Wilcox is a 70 y.o. female who presents for the following: Annual Exam (Fhx of MM in daughter - patient has noticed no new or changing moles, lesions, or spots.). The patient presents for Total-Body Skin Exam (TBSE) for skin cancer screening and mole check.  She is bothered by bumps at her face.   The following portions of the chart were reviewed this encounter and updated as appropriate:   Tobacco  Allergies  Meds  Problems  Med Hx  Surg Hx  Fam Hx      Review of Systems:  No other skin or systemic complaints except as noted in HPI or Assessment and Plan.  Objective  Well appearing patient in no apparent distress; mood and affect are within normal limits.  A full examination was performed including scalp, head, eyes, ears, nose, lips, neck, chest, axillae, abdomen, back, buttocks, bilateral upper extremities, bilateral lower extremities, hands, feet, fingers, toes, fingernails, and toenails. All findings within normal limits unless otherwise noted below.  Face Erythema and papules.  Right Upper Back 0.3 cm medium to dark brown thin papule. Fried egg pattern.    Assessment & Plan  Tinea versicolor Chest  Tinea versicolor is a chronic recurrent skin rash causing discolored scaly spots most commonly seen on back, chest, and/or shoulders.  It is generally asymptomatic. The rash is due to overgrowth of a common type of yeast present on everyone's skin and it is not contagious.  It tends to flare more in the summer due to increased sweating on trunk.  After rash is treated, the scaliness will resolve, but the discoloration will take longer to return to normal pigmentation. The periodic use of an OTC medicated soap/shampoo with zinc or selenium sulfide can be helpful to prevent yeast overgrowth and recurrence.   Patient declines tx at this time but will call for prescription Ketoconazole cream to use twice a day as needed if bothersome in  future.  Rosacea Face  Chronic condition with duration or expected duration over one year. Condition is bothersome to patient. Currently flared.  Rosacea is a chronic progressive skin condition usually affecting the face of adults, causing redness and/or acne bumps. It is treatable but not curable. It sometimes affects the eyes (ocular rosacea) as well. It may respond to topical and/or systemic medication and can flare with stress, sun exposure, alcohol, exercise and some foods.  Daily application of broad spectrum spf 30+ sunscreen to face is recommended to reduce flares.  Patient denies eye symptoms.   Start Rhofade QAM and Soolantra QD-BID.   Oxymetazoline HCl (RHOFADE) 1 % CREA - Face Apply a thin coat to the entire face QAM.  Ivermectin (SOOLANTRA) 1 % CREA - Face Apply to the face once to twice daily.  Nevus Right Upper Back  Benign-appearing.  Observation.  Call clinic for new or changing moles.  Recommend daily use of broad spectrum spf 30+ sunscreen to sun-exposed areas.   Lentigines - Scattered tan macules - Due to sun exposure - Benign-appearing, observe - Recommend daily broad spectrum sunscreen SPF 30+ to sun-exposed areas, reapply every 2 hours as needed. - Call for any changes  Seborrheic Keratoses - Stuck-on, waxy, tan-brown papules and/or plaques  - Benign-appearing - Discussed benign etiology and prognosis. - Observe - Call for any changes  Melanocytic Nevi - Tan-brown and/or pink-flesh-colored symmetric macules and papules - Benign appearing on exam today - Observation - Call clinic for new or changing moles - Recommend  daily use of broad spectrum spf 30+ sunscreen to sun-exposed areas.   Hemangiomas - Red papules - Discussed benign nature - Observe - Call for any changes  Actinic Damage - Chronic condition, secondary to cumulative UV/sun exposure - diffuse scaly erythematous macules with underlying dyspigmentation - Recommend daily broad  spectrum sunscreen SPF 30+ to sun-exposed areas, reapply every 2 hours as needed.  - Staying in the shade or wearing long sleeves, sun glasses (UVA+UVB protection) and wide brim hats (4-inch brim around the entire circumference of the hat) are also recommended for sun protection.  - Call for new or changing lesions.  Skin cancer screening performed today.  Return in about 1 year (around 04/26/2022) for TBSE.  Luther Redo, CMA, am acting as scribe for Forest Gleason, MD .  Documentation: I have reviewed the above documentation for accuracy and completeness, and I agree with the above.  Forest Gleason, MD

## 2021-05-02 ENCOUNTER — Telehealth: Payer: Self-pay

## 2021-05-02 DIAGNOSIS — L719 Rosacea, unspecified: Secondary | ICD-10-CM

## 2021-05-02 MED ORDER — METRONIDAZOLE 0.75 % EX CREA: TOPICAL_CREAM | Freq: Two times a day (BID) | CUTANEOUS | 0 refills | Status: DC

## 2021-05-02 NOTE — Telephone Encounter (Signed)
Pt advised by Aleatha Borer that Robb Matar is not covered by insurance and suggested switch to [employer].

## 2021-05-02 NOTE — Addendum Note (Signed)
Addended by: Harriett Sine on: 05/02/2021 02:43 PM   Modules accepted: Orders

## 2021-05-02 NOTE — Telephone Encounter (Signed)
Is the rhofade covered?  To substitute for soolantra, recommend metronidazole cream 0.75% twice a day (not mirvaso -- mirvaso doesn't treat the same thing). Thanks!

## 2021-05-03 DIAGNOSIS — Z23 Encounter for immunization: Secondary | ICD-10-CM | POA: Diagnosis not present

## 2021-05-22 ENCOUNTER — Telehealth: Payer: Self-pay

## 2021-05-22 ENCOUNTER — Telehealth: Payer: Self-pay | Admitting: Gastroenterology

## 2021-05-22 NOTE — Telephone Encounter (Signed)
Called patient she was in a lot of abdominal discomfort so overbooked her for tommorrow

## 2021-05-22 NOTE — Telephone Encounter (Signed)
LVM for pt to call back to schedule appt

## 2021-05-22 NOTE — Telephone Encounter (Signed)
Inbound call from pt requesting a call back stating that she is having abd pain.

## 2021-05-23 ENCOUNTER — Ambulatory Visit (INDEPENDENT_AMBULATORY_CARE_PROVIDER_SITE_OTHER): Payer: Medicare Other | Admitting: Gastroenterology

## 2021-05-23 ENCOUNTER — Encounter: Payer: Self-pay | Admitting: Gastroenterology

## 2021-05-23 VITALS — BP 179/82 | HR 75 | Temp 98.3°F | Ht 62.0 in | Wt 169.2 lb

## 2021-05-23 DIAGNOSIS — Z9989 Dependence on other enabling machines and devices: Secondary | ICD-10-CM | POA: Insufficient documentation

## 2021-05-23 DIAGNOSIS — K580 Irritable bowel syndrome with diarrhea: Secondary | ICD-10-CM

## 2021-05-23 MED ORDER — RIFAXIMIN 550 MG PO TABS
550.0000 mg | ORAL_TABLET | Freq: Three times a day (TID) | ORAL | 0 refills | Status: AC
Start: 2021-05-23 — End: 2021-06-06

## 2021-05-23 NOTE — Progress Notes (Signed)
Amy Darby, MD [address]  [place]  [place], [place] [address]  Main: [phone]  Fax: [phone]    Gastroenterology Consultation  Referring Provider:     [name], NP Primary Care Physician:  [name], NP Primary Gastroenterologist:  Dr. Cephas Wilcox Reason for Consultation: IBS, NASH-fibrosis        HPI:   Amy Wilcox is a 70 y.o. Caucasian female with metabolic syndrome referred by Dr. Carlis Abbott, Leticia Penna, NP  for consultation & management of chronic diarrhea. She describes sporadic episodes of non bloody diarrhea, approximately 5/day a/w abdominal cramps. Stools are of variable consistency. Certain foods like raw onions, cream cheese, spicy foods, raw fruits and vegetables, a/w bloating. These occur approximately upto 3 times/month. Also, has constant upper abdominal discomfort, and epigastric burning, on pepcid as needed. Takes imodium daily at bedtime. The symptoms have been chronic and occurring for the past 10 years. She underwent colonoscopy in [place] in 2009 and was normal. She reports that her symptoms got worse and her last colonoscopy and would prefer not to undergo another colonoscopy. Random biopsies was not performed at that time. She does not recall if she was tested for celiac disease or H. pylori infection. She did not have EGD in the past. She is not losing weight. She denies nausea or vomiting, use of antibiotics, sick contacts or recent travel. She is retired and moved along with her husband from [place] about 10 years ago. She has elderly parents and has to travel frequently to [place] which she finds it quite stressful.  She reports that she is trying to eat healthy food.  She is also found to have mildly elevated transaminases since 02/2017. She also has hyperlipidemia and unable to tolerate statins. He is currently on ezetimibe. HCV antibody negative. She denies alcohol use or IV drug abuse  Follow-up visit 12/31/2017 Since  last visit, patient underwent secondary liver disease workup which came back negative except for fatty liver and ultrasound elastography revealed moderate degree of fibrosis. She feels that her IBS symptoms have responded to VSL #3 but could not find it over-the-counter. her insurance did not approve amitriptyline. I switched to Zoloft and she took only one dose which caused headache. She is very anxious about her ultrasound results of the liver.she is here today to discuss about ultrasound results. Her husband is with her today. She wants to know if fatty liver and IBS are connected She is taking artificial sweeteners  Follow-up visit 04/03/2018 She reports that her IBS symptoms are manageable. She tried amitriptyline 50 mg at bedtime which resulted in drowsiness all day next day.  She also tried IBgard which did not provide any symptom relief.  She ordered VSL#3 online, she reports that she is watching what she is eating. She did not lose any weight since last visit.  She denies any other complaints, otherwise  Follow-up visit 09/06/2019 Patient continues to have ongoing loose bowel movements for which she takes half a pill of Imodium at bedtime daily.  She had 2 flareups in the last few months that have lasted for about 2 days.  She is interested to undergo colonoscopy for colon cancer screening.  She is also concerned about ongoing heartburn, epigastric discomfort.  She takes Pepcid as needed which does provide some relief.  Follow-up visit 10/20/2019 Patient requested an urgent follow-up visit due to onset of diarrhea right after the colonoscopy performed on 09/27/2019.  Patient reports that  2 days later, she started experiencing nonbloody bowel movements, several times a day which would last for 3 days followed by no bowel movement for 1 to 2 days.  She has been very frustrated with diarrhea as she cannot go out or visit her father.  Her weight has been stable.  She has been taking Imodium as needed.  She  denies consuming carbonated beverages  Follow-up visit 05/23/2021 Patient is here for follow-up of IBS and fatty liver.  She reports that her IBS symptoms are manageable.  She reports having bouts of diarrhea every 3 to 4 days and not having any BM in between these episodes.  Her bowel movements initially started of hard, followed by loose and watery.  Patient does not have to take any stool softener.  She does report abdominal bloating.  Her weight has been stable.  She is trying to follow healthy diet.  Her liver enzymes are normal.   NSAIDs: None  Antiplts/Anticoagulants/Anti thrombotics: None  GI Procedures: Colonoscopy in 2009 in [place], normal, biopsies were not performed EGD and colonoscopy 09/27/2019 - Normal duodenal bulb and second portion of the duodenum. - Erosive gastropathy with stigmata of recent bleeding. Biopsied. - Esophagogastric landmarks identified. - Normal gastroesophageal junction and esophagus.  - The examined portion of the ileum was normal. - The entire examined colon is normal. - The distal rectum and anal verge are normal on retroflexion view. - No specimens collected.  DIAGNOSIS:  A. 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  - NEGATIVE FOR H. PYLORI BY IMMUNOHISTOCHEMISTRY (IHC).  - NEGATIVE FOR INTESTINAL METAPLASIA, ATROPHY, DYSPLASIA, AND  MALIGNANCY.   Comment:  There is a mild superficial lymphoplasmacytic inflammatory infiltrate,  more prominent in the antral samples.  No active inflammation is  identified, and IHC is negative for H. pylori bacteria.    She denies family history of GI malignancy, inflammatory bowel disease or celiac disease  Past Medical History:  Diagnosis Date   Essential hypertension    Fibrocystic breast    Hyperlipidemia    IBS (irritable bowel syndrome)    Idiopathic hematuria    OSA (obstructive sleep apnea)     Past Surgical History:  Procedure Laterality Date    COLONOSCOPY WITH PROPOFOL N/A 09/27/2019   Procedure: COLONOSCOPY WITH PROPOFOL;  Surgeon: Lin Landsman, MD;  Location: [facility] ENDOSCOPY;  Service: Gastroenterology;  Laterality: N/A;   ESOPHAGOGASTRODUODENOSCOPY (EGD) WITH PROPOFOL N/A 09/27/2019   Procedure: ESOPHAGOGASTRODUODENOSCOPY (EGD) WITH PROPOFOL;  Surgeon: Lin Landsman, MD;  Location[facility] ENDOSCOPY;  Service: Gastroenterology;  Laterality: N/A;   TONSILLECTOMY AND ADENOIDECTOMY  1961    Current Outpatient Medications:    aspirin EC 81 MG tablet, Take 81 mg by mouth daily., Disp: , Rfl:    CALCIUM PO, Take by mouth., Disp: , Rfl:    Cholecalciferol (VITAMIN D) 2000 units CAPS, Take by mouth., Disp: , Rfl:    cyanocobalamin 500 MCG tablet, Take 500 mcg by mouth daily., Disp: , Rfl:    ezetimibe (ZETIA) 10 MG tablet, TAKE 1 TABLET BY MOUTH EVERY DAY FOR CHOLESTEROL, Disp: 90 tablet, Rfl: 2   hydrOXYzine (ATARAX/VISTARIL) 10 MG tablet, Take 1-2 tablets (10-20 mg total) by mouth 2 (two) times daily as needed for anxiety., Disp: 30 tablet, Rfl: 0   loperamide (IMODIUM) 2 MG capsule, Taking 1/2 capsule by mouth at bedtime as needed, Disp: , Rfl:    metoprolol succinate (TOPROL-XL) 50 MG 24 hr tablet, Take  1 tablet (50 mg total) by mouth daily. For blood pressure, Disp: 90 tablet, Rfl: 2   metroNIDAZOLE (METROCREAM) 0.75 % cream, Apply topically 2 (two) times daily., Disp: 45 g, Rfl: 0   Omega-3 Fatty Acids (FISH OIL PO), Take 1,200 mg by mouth 2 (two) times daily., Disp: , Rfl:    rifaximin (XIFAXAN) 550 MG TABS tablet, Take 1 tablet (550 mg total) by mouth 3 (three) times daily for 14 days., Disp: 42 tablet, Rfl: 0   valsartan-hydrochlorothiazide (DIOVAN-HCT) 80-12.5 MG tablet, TAKE 1 TABLET BY MOUTH EVERY DAY FOR BLOOD PRESSURE, Disp: 90 tablet, Rfl: 3   benzonatate (TESSALON) 200 MG capsule, Take 1 capsule (200 mg total) by mouth 3 (three) times daily as needed for cough., Disp: 15 capsule, Rfl: 0   colchicine 0.6 MG tablet,  Take 2 at first and repeat one tablet in one hour, Disp: 3 tablet, Rfl: 0   indomethacin (INDOCIN) 50 MG capsule, Take 1 capsule (50 mg total) by mouth 3 (three) times daily with meals., Disp: 30 capsule, Rfl: 0   Ivermectin (SOOLANTRA) 1 % CREA, Apply to the face once to twice daily., Disp: 45 g, Rfl: 3   Oxymetazoline HCl (RHOFADE) 1 % CREA, Apply a thin coat to the entire face QAM., Disp: 30 g, Rfl: 3    Family History  Problem Relation Age of Onset   Hypertension Mother    Hyperlipidemia Mother    Alzheimer's disease Mother    Heart disease Father    Heart attack Father    Hyperlipidemia Father    Hypertension Father    Breast cancer Neg Hx      Social History   Tobacco Use   Smoking status: Never   Smokeless tobacco: Never  Vaping Use   Vaping Use: Never used  Substance Use Topics   Alcohol use: No   Drug use: No    Allergies as of 05/23/2021 - Review Complete 04/26/2021  Allergen Reaction Noted   Livalo [pitavastatin]  02/20/2017   Statins  02/20/2017    Review of Systems:    All systems reviewed and negative except where noted in HPI.   Physical Exam:  BP (!) 179/82 (BP Location: Right Arm, Patient Position: Sitting, Cuff Size: Normal)   Pulse 75   Temp 98.3 F (36.8 C) (Oral)   Ht 5\' 2"  (1.575 m)   Wt 169 lb 3.2 oz (76.7 kg)   BMI 30.95 kg/m  No LMP recorded. Patient is postmenopausal.  General:   Alert,  Well-developed, well-nourished, pleasant and cooperative in NAD Head:  Normocephalic and atraumatic. Eyes:  Sclera clear, no icterus.   Conjunctiva pink. Ears:  Normal auditory acuity. Nose:  No deformity, discharge, or lesions. Mouth:  No deformity or lesions,oropharynx pink & moist. Neck:  Supple; no masses or thyromegaly. Lungs:  Respirations even and unlabored.  Clear throughout to auscultation.   No wheezes, crackles, or rhonchi. No acute distress. Heart:  Regular rate and rhythm; no murmurs, clicks, rubs, or gallops. Abdomen:  Normal bowel  sounds. Soft, non-tender and nondistended without masses, hepatosplenomegaly or hernias noted.  No guarding or rebound tenderness.   Rectal: Not performed Msk:  Symmetrical without gross deformities. Good, equal movement & strength bilaterally. Pulses:  Normal pulses noted. Extremities:  No clubbing or edema.  No cyanosis. Neurologic:  Alert and oriented x3;  grossly normal neurologically. Skin:  Intact without significant lesions or rashes. No jaundice. Psych:  Alert and cooperative. Normal mood and affect.  Imaging Studies:  No abdominal imaging available  Assessment and Plan:   Amy Wilcox is a 70 y.o.  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 coli, norovirus GI pathogen panel, stool for C. difficile, H. pylori stool antigen test negative, celiac serologies and CRP, fecal calprotectin came back normal Trial of Xifaxan for possible bacterial overgrowth Check pancreatic fecal elastase levels  Fatty liver Liver enzymes are normal Continue healthy diet   Follow up as needed   Amy Darby, MD

## 2021-05-24 ENCOUNTER — Encounter: Payer: Self-pay | Admitting: Gastroenterology

## 2021-05-24 ENCOUNTER — Other Ambulatory Visit: Payer: Self-pay | Admitting: Gastroenterology

## 2021-05-24 DIAGNOSIS — K580 Irritable bowel syndrome with diarrhea: Secondary | ICD-10-CM | POA: Diagnosis not present

## 2021-05-24 MED ORDER — METRONIDAZOLE 500 MG PO TABS: 500.0000 mg | ORAL_TABLET | Freq: Two times a day (BID) | ORAL | 0 refills | Status: AC

## 2021-05-29 LAB — PANCREATIC ELASTASE, FECAL: Pancreatic Elastase, Fecal: 449 ug Elast./g (ref >200)

## 2021-06-11 ENCOUNTER — Other Ambulatory Visit: Payer: Self-pay | Admitting: Primary Care

## 2021-06-11 DIAGNOSIS — F418 Other specified anxiety disorders: Secondary | ICD-10-CM

## 2021-07-03 ENCOUNTER — Other Ambulatory Visit: Payer: Self-pay

## 2021-07-03 ENCOUNTER — Encounter: Payer: Self-pay | Admitting: Gastroenterology

## 2021-07-03 DIAGNOSIS — R197 Diarrhea, unspecified: Secondary | ICD-10-CM

## 2021-07-03 NOTE — Progress Notes (Signed)
Put in lab orders per vanga

## 2021-07-11 DIAGNOSIS — R197 Diarrhea, unspecified: Secondary | ICD-10-CM | POA: Diagnosis not present

## 2021-07-11 DIAGNOSIS — A0471 Enterocolitis due to Clostridium difficile, recurrent: Secondary | ICD-10-CM | POA: Diagnosis not present

## 2021-07-15 LAB — GI PROFILE, STOOL, PCR

## 2021-07-15 LAB — CALPROTECTIN, FECAL: Calprotectin, Fecal: <16 ug/g (ref 0–120)

## 2021-07-16 ENCOUNTER — Encounter: Payer: Self-pay | Admitting: Gastroenterology

## 2021-07-16 ENCOUNTER — Telehealth: Payer: Self-pay

## 2021-07-16 DIAGNOSIS — H2513 Age-related nuclear cataract, bilateral: Secondary | ICD-10-CM | POA: Diagnosis not present

## 2021-07-16 DIAGNOSIS — H524 Presbyopia: Secondary | ICD-10-CM | POA: Diagnosis not present

## 2021-07-16 MED ORDER — VANCOMYCIN HCL 125 MG PO CAPS: 125.0000 mg | ORAL_CAPSULE | Freq: Four times a day (QID) | ORAL | 0 refills | Status: AC

## 2021-07-16 NOTE — Telephone Encounter (Signed)
Called patient and patient verbalized understanding of results. Sent medication to the pharmacy

## 2021-07-16 NOTE — Telephone Encounter (Signed)
-----   Message from Lin Landsman, MD sent at 07/16/2021 12:25 PM EST ----- Please inform patient that her stool studies came back positive for C. difficile infection which explains her diarrhea.  Recommend 10 days of oral vancomycin 125 mg every 6 hours.  She should call our office back if her diarrhea is not improving  Rohini Vanga

## 2021-08-07 ENCOUNTER — Ambulatory Visit: Payer: Medicare Other | Admitting: Primary Care

## 2021-08-21 ENCOUNTER — Encounter: Payer: Self-pay | Admitting: Primary Care

## 2021-08-21 ENCOUNTER — Ambulatory Visit (INDEPENDENT_AMBULATORY_CARE_PROVIDER_SITE_OTHER): Payer: Medicare Other | Admitting: Primary Care

## 2021-08-21 ENCOUNTER — Other Ambulatory Visit: Payer: Self-pay

## 2021-08-21 VITALS — BP 144/74 | HR 78 | Temp 98.2°F | Ht 62.0 in | Wt 169.0 lb

## 2021-08-21 DIAGNOSIS — E785 Hyperlipidemia, unspecified: Secondary | ICD-10-CM | POA: Diagnosis not present

## 2021-08-21 DIAGNOSIS — K580 Irritable bowel syndrome with diarrhea: Secondary | ICD-10-CM

## 2021-08-21 DIAGNOSIS — R7303 Prediabetes: Secondary | ICD-10-CM

## 2021-08-21 DIAGNOSIS — I10 Essential (primary) hypertension: Secondary | ICD-10-CM

## 2021-08-21 DIAGNOSIS — F418 Other specified anxiety disorders: Secondary | ICD-10-CM | POA: Diagnosis not present

## 2021-08-21 DIAGNOSIS — G4733 Obstructive sleep apnea (adult) (pediatric): Secondary | ICD-10-CM

## 2021-08-21 LAB — HEMOGLOBIN A1C: Hgb A1c MFr Bld: 6.1 % (ref 4.6–6.5)

## 2021-08-21 NOTE — Progress Notes (Signed)
Subjective:    Patient ID: Amy Wilcox, female    DOB: 1951-05-27, 71 y.o.   MRN: [id number]  HPI  Shiane Wenberg is a very pleasant 71 y.o. female with a history of hypertension, prediabetes, OSA, fatty liver, hyperlipidemia who presents today follow up of chronic conditions.  1) Essential Hypertension: Currently managed on valsartan-HCTZ 80-12.5 mg, metoprolol succinate 50 mg daily. She is checking her BP at home which is running 120's/60's-70's. She has not had her BP medication today.   She is compliant to her CPAP nightly.   BP Readings from Last 3 Encounters:  08/21/21 (!) 144/74  05/23/21 (!) 179/82  02/20/21 (!) 156/91   2) Prediabetes: A1C of 6.2, 6 months ago. She denies polyuria, polydipsia, numbness. She has been avoiding sugary foods, watching her diet in general. Has lost a few pounds on her scale. No regular exercise.   Wt Readings from Last 3 Encounters:  08/21/21 169 lb (76.7 kg)  05/23/21 169 lb 3.2 oz (76.7 kg)  02/20/21 166 lb (75.3 kg)     3) Hyperlipidemia: Currently managed on Zetia 10 mg daily. Last LDL was 150 mg 6 months ago. She is compliant daily. She is due for repeat   4) Anxiety/Stress: Currently managed on hydroxyzine 10-20 mg BID PRN. Feels well managed. Increased stress overall, feels that she can manage on her own.   Review of Systems  Respiratory:  Negative for shortness of breath.   Cardiovascular:  Negative for chest pain.  Neurological:  Negative for numbness.  Psychiatric/Behavioral:  The patient is nervous/anxious.         Past Medical History:  Diagnosis Date   Essential hypertension    Fibrocystic breast    Hyperlipidemia    IBS (irritable bowel syndrome)    Idiopathic hematuria    OSA (obstructive sleep apnea)     Social History   Socioeconomic History   Marital status: Married    Spouse name: Not on file   Number of children: Not on file   Years of education: Not on file   Highest education level: Not on file   Occupational History   Occupation: retired  Tobacco Use   Smoking status: Never   Smokeless tobacco: Never  Vaping Use   Vaping Use: Never used  Substance and Sexual Activity   Alcohol use: No   Drug use: No   Sexual activity: Yes  Other Topics Concern   Not on file  Social History Narrative   Married.   3 children.   Retired. Once worked a Agricultural engineer.   Enjoys spending time with family.   Social Determinants of Health   Financial Resource Strain: Low Risk    Difficulty of Paying Living Expenses: Not hard at all  Food Insecurity: No Food Insecurity   Worried About Charity fundraiser in the Last Year: Never true   [place] in the Last Year: Never true  Transportation Needs: No Transportation Needs   Lack of Transportation (Medical): No   Lack of Transportation (Non-Medical): No  Physical Activity: Inactive   Days of Exercise per Week: 0 days   Minutes of Exercise per Session: 0 min  Stress: No Stress Concern Present   Feeling of Stress : Not at all  Social Connections: Not on file  Intimate Partner Violence: Not At Risk   Fear of Current or Ex-Partner: No   Emotionally Abused: No   Physically Abused: No   Sexually Abused: No    Past  Surgical History:  Procedure Laterality Date   COLONOSCOPY WITH PROPOFOL N/A 09/27/2019   Procedure: COLONOSCOPY WITH PROPOFOL;  Surgeon: Lin Landsman, MD;  Location: [facility] ENDOSCOPY;  Service: Gastroenterology;  Laterality: N/A;   ESOPHAGOGASTRODUODENOSCOPY (EGD) WITH PROPOFOL N/A 09/27/2019   Procedure: ESOPHAGOGASTRODUODENOSCOPY (EGD) WITH PROPOFOL;  Surgeon: Lin Landsman, MD;  Location: [facility] ENDOSCOPY;  Service: Gastroenterology;  Laterality: N/A;   TONSILLECTOMY AND ADENOIDECTOMY  1961    Family History  Problem Relation Age of Onset   Hypertension Mother    Hyperlipidemia Mother    Alzheimer's disease Mother    Heart disease Father    Heart attack Father    Hyperlipidemia Father    Hypertension Father     Breast cancer Neg Hx     Allergies  Allergen Reactions   Livalo [Pitavastatin]    Statins     Current Outpatient Medications on File Prior to Visit  Medication Sig Dispense Refill   aspirin EC 81 MG tablet Take 81 mg by mouth daily.     Cholecalciferol (VITAMIN D3) 50 MCG (2000 UT) TABS 1 tablet     cyanocobalamin 500 MCG tablet Take 500 mcg by mouth daily.     ezetimibe (ZETIA) 10 MG tablet TAKE 1 TABLET BY MOUTH EVERY DAY FOR CHOLESTEROL 90 tablet 2   hydrOXYzine (ATARAX) 10 MG tablet TAKE 1 TO 2 TABLETS BY MOUTH TWICE A DAY AS NEEDED FOR ANXIETY 30 tablet 0   loperamide (IMODIUM) 2 MG capsule Taking 1/2 capsule by mouth at bedtime as needed     metoprolol succinate (TOPROL-XL) 50 MG 24 hr tablet Take 1 tablet (50 mg total) by mouth daily. For blood pressure 90 tablet 2   Omega-3 Fatty Acids (FISH OIL PO) Take 1,200 mg by mouth 2 (two) times daily.     valsartan-hydrochlorothiazide (DIOVAN-HCT) 80-12.5 MG tablet TAKE 1 TABLET BY MOUTH EVERY DAY FOR BLOOD PRESSURE 90 tablet 3   metroNIDAZOLE (METROCREAM) 0.75 % cream Apply topically 2 (two) times daily. (Patient not taking: Reported on 08/21/2021) 45 g 0   Oyster Shell Calcium 500 MG TABS 1 tablet with meals (Patient not taking: Reported on 08/21/2021)     No current facility-administered medications on file prior to visit.    BP (!) 144/74    Pulse 78    Temp 98.2 F (36.8 C) (Oral)    Ht 5\' 2"  (1.575 m)    Wt 169 lb (76.7 kg)    SpO2 98%    BMI 30.91 kg/m  Objective:   Physical Exam Cardiovascular:     Rate and Rhythm: Normal rate and regular rhythm.  Pulmonary:     Effort: Pulmonary effort is normal.     Breath sounds: Normal breath sounds.  Musculoskeletal:     Cervical back: Neck supple.  Skin:    General: Skin is warm and dry.          Assessment & Plan:  25 minutes spent face to face with patient, >50% spent counseling or coordinating care.    This visit occurred during the SARS-CoV-2 public health emergency.   Safety protocols were in place, including screening questions prior to the visit, additional usage of staff PPE, and extensive cleaning of exam room while observing appropriate contact time as indicated for disinfecting solutions.

## 2021-08-21 NOTE — Assessment & Plan Note (Signed)
Above goal today, has not had medication. Home BP readings controlled.  Continue valsartan-HCTZ 80-12.5 mg daily, metoprolol succinate 50 mg daily.

## 2021-08-21 NOTE — Assessment & Plan Note (Signed)
Following with GI, feels well managed. Continue Imodium 1 mg HS.

## 2021-08-21 NOTE — Assessment & Plan Note (Signed)
Repeat A1C pending. Reviewed A1C from July 2022 which was 6.2.  Discussed the importance of a healthy diet and regular exercise in order for weight loss, and to reduce the risk of further co-morbidity.

## 2021-08-21 NOTE — Assessment & Plan Note (Signed)
Compliant nightly, continue same.

## 2021-08-21 NOTE — Assessment & Plan Note (Signed)
Increased recently, declines other treatment. Continue hydroxyzine 10-20 mg BID PRN.  Continue to monitor

## 2021-08-21 NOTE — Assessment & Plan Note (Signed)
Continue Zetia 10 mg daily. Repeat lipid panel pending.  

## 2021-08-21 NOTE — Patient Instructions (Signed)
Stop by the lab prior to leaving today. I will notify you of your results once received.   It was a pleasure to see you today!  

## 2021-08-22 LAB — LIPID PANEL
Cholesterol: 221 mg/dL — ABNORMAL HIGH (ref 0–200)
HDL: 44.5 mg/dL (ref >39.00)
NonHDL: 176.53
Total CHOL/HDL Ratio: 5
Triglycerides: 240 mg/dL — ABNORMAL HIGH (ref 0.0–149.0)
VLDL: 48 mg/dL — ABNORMAL HIGH (ref 0.0–40.0)

## 2021-08-22 LAB — LDL CHOLESTEROL, DIRECT: Direct LDL: 145 mg/dL

## 2021-09-05 ENCOUNTER — Encounter: Payer: Self-pay | Admitting: Gastroenterology

## 2021-09-05 ENCOUNTER — Ambulatory Visit (INDEPENDENT_AMBULATORY_CARE_PROVIDER_SITE_OTHER): Payer: Medicare Other | Admitting: Gastroenterology

## 2021-09-05 ENCOUNTER — Other Ambulatory Visit: Payer: Self-pay

## 2021-09-05 VITALS — BP 145/95 | HR 70 | Temp 98.7°F | Ht 62.0 in | Wt 166.4 lb

## 2021-09-05 DIAGNOSIS — K529 Noninfective gastroenteritis and colitis, unspecified: Secondary | ICD-10-CM | POA: Diagnosis not present

## 2021-09-05 DIAGNOSIS — Z8619 Personal history of other infectious and parasitic diseases: Secondary | ICD-10-CM

## 2021-09-05 DIAGNOSIS — R7989 Other specified abnormal findings of blood chemistry: Secondary | ICD-10-CM

## 2021-09-05 DIAGNOSIS — R197 Diarrhea, unspecified: Secondary | ICD-10-CM | POA: Diagnosis not present

## 2021-09-05 NOTE — Progress Notes (Signed)
Cephas Darby, MD [address]  [address]  [place], [place] [address]  Main: [phone]  Fax: [phone]    Gastroenterology Consultation  Referring Provider:     [name], NP Primary Care Physician:  [name], NP Primary Gastroenterologist:  Dr. Cephas Darby Reason for Consultation: Chronic intermittent diarrhea, NASH-fibrosis        HPI:   Amy Wilcox is a 71 y.o. Caucasian female with metabolic syndrome referred by Dr. Carlis Abbott, Leticia Penna, NP  for consultation & management of chronic diarrhea. She describes sporadic episodes of non bloody diarrhea, approximately 5/day a/w abdominal cramps. Stools are of variable consistency. Certain foods like raw onions, cream cheese, spicy foods, raw fruits and vegetables, a/w bloating. These occur approximately upto 3 times/month. Also, has constant upper abdominal discomfort, and epigastric burning, on pepcid as needed. Takes imodium daily at bedtime. The symptoms have been chronic and occurring for the past 10 years. She underwent colonoscopy in [place] in 2009 and was normal. She reports that her symptoms got worse and her last colonoscopy and would prefer not to undergo another colonoscopy. Random biopsies was not performed at that time. She does not recall if she was tested for celiac disease or H. pylori infection. She did not have EGD in the past. She is not losing weight. She denies nausea or vomiting, use of antibiotics, sick contacts or recent travel. She is retired and moved along with her husband from [place] about 10 years ago. She has elderly parents and has to travel frequently to [place] which she finds it quite stressful.  She reports that she is trying to eat healthy food.  She is also found to have mildly elevated transaminases since 02/2017. She also has hyperlipidemia and unable to tolerate statins. He is currently on ezetimibe. HCV antibody negative. She denies alcohol use or IV drug  abuse  Follow-up visit 12/31/2017 Since last visit, patient underwent secondary liver disease workup which came back negative except for fatty liver and ultrasound elastography revealed moderate degree of fibrosis. She feels that her IBS symptoms have responded to VSL #3 but could not find it over-the-counter. her insurance did not approve amitriptyline. I switched to Zoloft and she took only one dose which caused headache. She is very anxious about her ultrasound results of the liver.she is here today to discuss about ultrasound results. Her husband is with her today. She wants to know if fatty liver and IBS are connected She is taking artificial sweeteners  Follow-up visit 04/03/2018 She reports that her IBS symptoms are manageable. She tried amitriptyline 50 mg at bedtime which resulted in drowsiness all day next day.  She also tried IBgard which did not provide any symptom relief.  She ordered VSL#3 online, she reports that she is watching what she is eating. She did not lose any weight since last visit.  She denies any other complaints, otherwise  Follow-up visit 09/06/2019 Patient continues to have ongoing loose bowel movements for which she takes half a pill of Imodium at bedtime daily.  She had 2 flareups in the last few months that have lasted for about 2 days.  She is interested to undergo colonoscopy for colon cancer screening.  She is also concerned about ongoing heartburn, epigastric discomfort.  She takes Pepcid as needed which does provide some relief.  Follow-up visit 10/20/2019 Patient requested an urgent follow-up visit due to onset of diarrhea right after the colonoscopy performed on 09/27/2019.  Patient  reports that 2 days later, she started experiencing nonbloody bowel movements, several times a day which would last for 3 days followed by no bowel movement for 1 to 2 days.  She has been very frustrated with diarrhea as she cannot go out or visit her father.  Her weight has been stable.   She has been taking Imodium as needed.  She denies consuming carbonated beverages  Follow-up visit 05/23/2021 Patient is here for follow-up of IBS and fatty liver.  She reports that her IBS symptoms are manageable.  She reports having bouts of diarrhea every 3 to 4 days and not having any BM in between these episodes.  Her bowel movements initially started of hard, followed by loose and watery.  Patient does not have to take any stool softener.  She does report abdominal bloating.  Her weight has been stable.  She is trying to follow healthy diet.  Her liver enzymes are normal.  Follow-up visit 09/05/2021 Patient is here for follow-up of chronic intermittent diarrhea.  She had a flareup of diarrhea in last week of December 2023, stool studies came back positive for C. difficile, treated with 10 days course of oral vancomycin 125 mg.  Had diarrhea resolved for few days, unfortunately, has recurred, most of the days she has 3-4 loose bowel movements and has been taking Imodium almost on a daily basis.  This has significantly impaired her quality of life, cannot go out with her family.  Her son has special needs.  Her weight has been stable.   NSAIDs: None  Antiplts/Anticoagulants/Anti thrombotics: None  GI Procedures: Colonoscopy in 2009 in [place], normal, biopsies were not performed EGD and colonoscopy 09/27/2019 - Normal duodenal bulb and second portion of the duodenum. - Erosive gastropathy with stigmata of recent bleeding. Biopsied. - Esophagogastric landmarks identified. - Normal gastroesophageal junction and esophagus.  - The examined portion of the ileum was normal. - The entire examined colon is normal. - The distal rectum and anal verge are normal on retroflexion view. - No specimens collected.  DIAGNOSIS:  A. 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  - NEGATIVE FOR H. PYLORI BY IMMUNOHISTOCHEMISTRY (IHC).  - NEGATIVE FOR INTESTINAL  METAPLASIA, ATROPHY, DYSPLASIA, AND  MALIGNANCY.   Comment:  There is a mild superficial lymphoplasmacytic inflammatory infiltrate,  more prominent in the antral samples.  No active inflammation is  identified, and IHC is negative for H. pylori bacteria.    She denies family history of GI malignancy, inflammatory bowel disease or celiac disease  Past Medical History:  Diagnosis Date   Essential hypertension    Fibrocystic breast    Hyperlipidemia    IBS (irritable bowel syndrome)    Idiopathic hematuria    OSA (obstructive sleep apnea)     Past Surgical History:  Procedure Laterality Date   COLONOSCOPY WITH PROPOFOL N/A 09/27/2019   Procedure: COLONOSCOPY WITH PROPOFOL;  Surgeon: Lin Landsman, MD;  Location: [facility] ENDOSCOPY;  Service: Gastroenterology;  Laterality: N/A;   ESOPHAGOGASTRODUODENOSCOPY (EGD) WITH PROPOFOL N/A 09/27/2019   Procedure: ESOPHAGOGASTRODUODENOSCOPY (EGD) WITH PROPOFOL;  Surgeon: Lin Landsman, MD;  Location: [facility] ENDOSCOPY;  Service: Gastroenterology;  Laterality: N/A;   TONSILLECTOMY AND ADENOIDECTOMY  1961    Current Outpatient Medications:    aspirin EC 81 MG tablet, Take 81 mg by mouth daily., Disp: , Rfl:    Cholecalciferol (VITAMIN D3) 50 MCG (2000 UT) TABS, 1 tablet, Disp: , Rfl:    cyanocobalamin  500 MCG tablet, Take 500 mcg by mouth daily., Disp: , Rfl:    ezetimibe (ZETIA) 10 MG tablet, TAKE 1 TABLET BY MOUTH EVERY DAY FOR CHOLESTEROL, Disp: 90 tablet, Rfl: 2   hydrOXYzine (ATARAX) 10 MG tablet, TAKE 1 TO 2 TABLETS BY MOUTH TWICE A DAY AS NEEDED FOR ANXIETY, Disp: 30 tablet, Rfl: 0   loperamide (IMODIUM) 2 MG capsule, Taking 1/2 capsule by mouth at bedtime as needed, Disp: , Rfl:    metoprolol succinate (TOPROL-XL) 50 MG 24 hr tablet, Take 1 tablet (50 mg total) by mouth daily. For blood pressure, Disp: 90 tablet, Rfl: 2   Omega-3 Fatty Acids (FISH OIL PO), Take 1,200 mg by mouth 2 (two) times daily., Disp: , Rfl:    Oyster Shell  Calcium 500 MG TABS, , Disp: , Rfl:    valsartan-hydrochlorothiazide (DIOVAN-HCT) 80-12.5 MG tablet, TAKE 1 TABLET BY MOUTH EVERY DAY FOR BLOOD PRESSURE, Disp: 90 tablet, Rfl: 3    Family History  Problem Relation Age of Onset   Hypertension Mother    Hyperlipidemia Mother    Alzheimer's disease Mother    Heart disease Father    Heart attack Father    Hyperlipidemia Father    Hypertension Father    Breast cancer Neg Hx      Social History   Tobacco Use   Smoking status: Never   Smokeless tobacco: Never  Vaping Use   Vaping Use: Never used  Substance Use Topics   Alcohol use: No   Drug use: No    Allergies as of 09/05/2021 - Review Complete 09/05/2021  Allergen Reaction Noted   Livalo [pitavastatin]  02/20/2017   Statins  02/20/2017    Review of Systems:    All systems reviewed and negative except where noted in HPI.   Physical Exam:  BP (!) 145/95 (BP Location: Left Arm, Patient Position: Sitting, Cuff Size: Normal)    Pulse 70    Temp 98.7 F (37.1 C) (Oral)    Ht 5\' 2"  (1.575 m)    Wt 166 lb 6 oz (75.5 kg)    BMI 30.43 kg/m  No LMP recorded. Patient is postmenopausal.  Wilcox:   Alert,  Well-developed, well-nourished, pleasant and cooperative in NAD Head:  Normocephalic and atraumatic. Eyes:  Sclera clear, no icterus.   Conjunctiva pink. Ears:  Normal auditory acuity. Nose:  No deformity, discharge, or lesions. Mouth:  No deformity or lesions,oropharynx pink & moist. Neck:  Supple; no masses or thyromegaly. Lungs:  Respirations even and unlabored.  Clear throughout to auscultation.   No wheezes, crackles, or rhonchi. No acute distress. Heart:  Regular rate and rhythm; no murmurs, clicks, rubs, or gallops. Abdomen:  Normal bowel sounds. Soft, non-tender and nondistended without masses, hepatosplenomegaly or hernias noted.  No guarding or rebound tenderness.   Rectal: Not performed Msk:  Symmetrical without gross deformities. Good, equal movement & strength  bilaterally. Pulses:  Normal pulses noted. Extremities:  No clubbing or edema.  No cyanosis. Neurologic:  Alert and oriented x3;  grossly normal neurologically. Skin:  Intact without significant lesions or rashes. No jaundice. Psych:  Alert and cooperative. Normal mood and affect.  Imaging Studies: No abdominal imaging available  Assessment and Plan:   Lianni Kanaan is a 71 y.o.  White female with metabolic syndrome, Amy Wilcox fibrosis, chronic intermittent diarrhea, history of C. difficile infection in 1/23, norovirus in 8/21, 6/21, E. coli in 6/21, 4/21.  She is treated for C. difficile infection with 10 days course  of oral vancomycin.  She has recurrence of diarrhea  Recommend repeat GI profile PCR to rule out infection.  If this is negative, recommend colonoscopy with TI evaluation and biopsies Rest of the work-up of chronic diarrhea including H. pylori stool antigen test negative, celiac serologies and CRP, fecal calprotectin and pancreatic fecal elastase levels came back normal Recommend to check immunoglobulin panel  Fatty liver Liver enzymes were normal in the past Recheck LFTs today Continue healthy diet   Follow up as needed   Cephas Darby, MD

## 2021-09-09 LAB — HEPATIC FUNCTION PANEL
ALT: 24 IU/L (ref 0–32)
AST: 22 IU/L (ref 0–40)
Albumin: 5 g/dL — ABNORMAL HIGH (ref 3.8–4.8)
Alkaline Phosphatase: 36 IU/L — ABNORMAL LOW (ref 44–121)
Bilirubin Total: 0.5 mg/dL (ref 0.0–1.2)
Bilirubin, Direct: 0.11 mg/dL (ref 0.00–0.40)
Total Protein: 7.4 g/dL (ref 6.0–8.5)

## 2021-09-09 LAB — IMMUNOGLOBULINS A/E/G/M, SERUM
IgA/Immunoglobulin A, Serum: 204 mg/dL (ref 87–352)
IgE (Immunoglobulin E), Serum: 95 IU/mL (ref 6–495)
IgG (Immunoglobin G), Serum: 807 mg/dL (ref 586–1602)
IgM (Immunoglobulin M), Srm: 62 mg/dL (ref 26–217)

## 2021-09-10 ENCOUNTER — Other Ambulatory Visit: Payer: Self-pay | Admitting: Gastroenterology

## 2021-09-10 DIAGNOSIS — A0472 Enterocolitis due to Clostridium difficile, not specified as recurrent: Secondary | ICD-10-CM | POA: Diagnosis not present

## 2021-09-10 DIAGNOSIS — Z8619 Personal history of other infectious and parasitic diseases: Secondary | ICD-10-CM | POA: Diagnosis not present

## 2021-09-10 DIAGNOSIS — R197 Diarrhea, unspecified: Secondary | ICD-10-CM | POA: Diagnosis not present

## 2021-09-10 DIAGNOSIS — R7989 Other specified abnormal findings of blood chemistry: Secondary | ICD-10-CM | POA: Diagnosis not present

## 2021-09-11 ENCOUNTER — Other Ambulatory Visit: Payer: Self-pay | Admitting: Gastroenterology

## 2021-09-11 ENCOUNTER — Encounter: Payer: Self-pay | Admitting: Gastroenterology

## 2021-09-11 DIAGNOSIS — A0471 Enterocolitis due to Clostridium difficile, recurrent: Secondary | ICD-10-CM

## 2021-09-11 LAB — GI PROFILE, STOOL, PCR

## 2021-09-11 MED ORDER — FIDAXOMICIN 200 MG PO TABS: ORAL_TABLET | ORAL | 0 refills | Status: DC

## 2021-09-12 ENCOUNTER — Telehealth: Payer: Self-pay

## 2021-09-12 ENCOUNTER — Other Ambulatory Visit: Payer: Self-pay

## 2021-09-12 NOTE — Telephone Encounter (Signed)
[employer] is approved by patient insurance but she has to meet her deductible which is 2,700 and she only met 225.55 of her Deductible. [employer] will call the patient and discuss billing and  assistance for the medication  ?

## 2021-09-13 ENCOUNTER — Telehealth: Payer: Self-pay

## 2021-09-13 NOTE — Telephone Encounter (Signed)
Called Jodie at [phone] the finale coordinator she states she will check with the hub and make sure they reach out to the patient and if the cost is to much for the patient they will do  assistance. Called patient and gave patient the number to reach out to the hub and informed her they would reach out to her also ?

## 2021-09-14 ENCOUNTER — Other Ambulatory Visit: Payer: Self-pay | Admitting: Gastroenterology

## 2021-09-14 DIAGNOSIS — A0471 Enterocolitis due to Clostridium difficile, recurrent: Secondary | ICD-10-CM

## 2021-09-14 MED ORDER — VANCOMYCIN HCL 125 MG PO CAPS: ORAL_CAPSULE | ORAL | 0 refills | Status: AC

## 2021-09-17 DIAGNOSIS — G4733 Obstructive sleep apnea (adult) (pediatric): Secondary | ICD-10-CM | POA: Diagnosis not present

## 2021-09-18 DIAGNOSIS — Z20822 Contact with and (suspected) exposure to covid-19: Secondary | ICD-10-CM | POA: Diagnosis not present

## 2021-09-19 ENCOUNTER — Telehealth: Payer: Self-pay

## 2021-09-19 NOTE — Telephone Encounter (Signed)
Called [employer] [facility] at [phone]. The case manager said she had a 2000 dollar copay and has been trying to reach out to the patient about seeing if she needed patient assistance for the medication. Called patient and she states she has talk to them twice and the first time they told her she had to pay 2000 dollars and then they called her back and said she had a 0 dollar copay. Called case manager back and states yes she does not have a copay but has a [employer] she has to meet. She will reach out to her to see if she will qualify for patient assistance  ?

## 2021-09-20 NOTE — Telephone Encounter (Signed)
Called [employer] [facility] this morning and they said the case manger would call me back when she is available  ?

## 2021-09-21 NOTE — Telephone Encounter (Signed)
Called [employer] [facility] and they said on 09/20/21 they gave the patient the information to the patient assistance program and said she would contact them  ?

## 2021-09-24 NOTE — Telephone Encounter (Signed)
Called patient and patient states that she called the number that the case manager gave her for patient assistance. She states she called that number and the only way they would help her if she had crohn's. She states that they gave her another number to call and she left a message for call back and no one has called her back. She states her and husband discussed the cost and she is okay with paying the 2000 dollars because she wants to feel better. Informed patient that she needed to stop the antibiotics 2 days before procedure. Called carla and she said to follow up with the case manager to see if there is anything else she can apply for. Called case manager and she said that all she has for finance  assistance but she will research some more and give [place] a call back  ?

## 2021-09-26 ENCOUNTER — Encounter: Payer: Self-pay | Admitting: Gastroenterology

## 2021-09-26 NOTE — Telephone Encounter (Signed)
Patient sent a mychart message and sent approval letter that she got approved for patient assistance for the medication. Will call the patient assistance pharmacy today to order the medication.  ?

## 2021-09-26 NOTE — Telephone Encounter (Signed)
Called the patient assistance pharmacy that opens at 9:00am at [phone] and they state they have not receive the prescription yet from the patient assistance foundation. They state once she is approved it takes 24 to 48 hours till they receive the prescription and everything.  ?

## 2021-09-27 NOTE — Telephone Encounter (Signed)
Ginger order the medication today  ?

## 2021-09-27 NOTE — Telephone Encounter (Signed)
Called the case manger and she states for buy and bill they do not submit the presecretion to the [facility]. She states we have to order the medication. Asked why the last time we had a patient assistance program it came directly from [facility]. She states that was a different kind of patient assistance. This patient has medicare assistance and gave me the number to call them. Called [phone] and they said the patient needed to uploaded documents and she was only approved till 10/07/21. Called patient back and she states she is approved till 10/25/21 and she filled out everything online. She states they did not tell her anything about how to she would get the money for the help. Called back the patient is approved for the medication till 10/25/21. We do have to bill the medicare Patient assistance for [place] to get paid for the medication. She states this has to be faxed or mailed to them. It is  ? ?[place] ?Phone [phone] ?Fax [phone] ?Address [phone]  ?[place]  [address] ?Payor ID for Electronically is [id number] ? ?We can submit claims to them Electronically, fax or by phone.  ? ?Patient ID number is [id number]  ?Group number is [id number] ? ?Talk to [facility] with Medicare Patient assistance and she said patient has a 0 dollar copay  ? ?Called patient back and informed patient that I got all the billing information and we are good for Wednesday  ? ?

## 2021-10-02 ENCOUNTER — Telehealth: Payer: Self-pay

## 2021-10-02 NOTE — Telephone Encounter (Signed)
Patient states she has 10 pills of the vancomycin left. She states that she stopped when she was going to starting the medication to once a day. Patient is reschedule to 10/17/21 ?

## 2021-10-02 NOTE — Telephone Encounter (Signed)
Patient medication will not be here on Thursday we are going to have to reschedule when you return from [place]. Do you still want patient to hold antibiotics  ?

## 2021-10-02 NOTE — Telephone Encounter (Signed)
Ok, let her restart vancomycin ? ?RV ?

## 2021-10-02 NOTE — Telephone Encounter (Signed)
This morning when I opened the box for the stool transplant it did not have any Dry Ice in the box to keep the medication frozen. Ginger talk to [place] and she said yes the medication is not good now. Ginger is reaching out to our supplier to let them know and she if they can send the medication to [place] ASAP. Called patient and reschedule her procedure to Thursday if we can get the medication. Patient has not taken antibiotic since Sunday.  ?

## 2021-10-02 NOTE — Telephone Encounter (Signed)
Patient verbalized understanding of instructions  

## 2021-10-03 ENCOUNTER — Ambulatory Visit: Payer: Medicare Other | Admitting: Gastroenterology

## 2021-10-04 ENCOUNTER — Ambulatory Visit: Payer: Medicare Other | Admitting: Gastroenterology

## 2021-10-17 ENCOUNTER — Ambulatory Visit (INDEPENDENT_AMBULATORY_CARE_PROVIDER_SITE_OTHER): Payer: Medicare Other | Admitting: Gastroenterology

## 2021-10-17 ENCOUNTER — Encounter: Payer: Self-pay | Admitting: Gastroenterology

## 2021-10-17 VITALS — BP 135/80 | HR 80 | Temp 98.0°F | Ht 62.0 in | Wt 160.0 lb

## 2021-10-17 DIAGNOSIS — A0471 Enterocolitis due to Clostridium difficile, recurrent: Secondary | ICD-10-CM | POA: Diagnosis not present

## 2021-10-17 NOTE — Progress Notes (Signed)
?  ?Cephas Darby, MD ?[address]  ?[address]  ?[place], [place] [address]  ?Main: [phone]  ?Fax: [phone] ? ? ? ?Gastroenterology Consultation ? ?Referring Provider:     [name], NP ?Primary Care Physician:  [name], NP ?Primary Gastroenterologist:  Dr. Cephas Darby ?Reason for Consultation: Recurrent C. difficile infection      ? HPI:   ?Amy Wilcox is a 71 y.o. Caucasian female with metabolic syndrome referred by Dr. Carlis Abbott, Leticia Penna, NP  for consultation & management of chronic diarrhea. She describes sporadic episodes of non bloody diarrhea, approximately 5/day a/w abdominal cramps. Stools are of variable consistency. Certain foods like raw onions, cream cheese, spicy foods, raw fruits and vegetables, a/w bloating. These occur approximately upto 3 times/month. Also, has constant upper abdominal discomfort, and epigastric burning, on pepcid as needed. Takes imodium daily at bedtime. The symptoms have been chronic and occurring for the past 10 years. She underwent colonoscopy in [place] in 2009 and was normal. She reports that her symptoms got worse and her last colonoscopy and would prefer not to undergo another colonoscopy. Random biopsies was not performed at that time. She does not recall if she was tested for celiac disease or H. pylori infection. She did not have EGD in the past. She is not losing weight. She denies nausea or vomiting, use of antibiotics, sick contacts or recent travel. She is retired and moved along with her husband from [place] about 10 years ago. She has elderly parents and has to travel frequently to [place] which she finds it quite stressful.  ?She reports that she is trying to eat healthy food. ? ?She is also found to have mildly elevated transaminases since 02/2017. She also has hyperlipidemia and unable to tolerate statins. He is currently on ezetimibe. HCV antibody negative. She denies alcohol use or IV drug abuse ? ?Follow-up visit  12/31/2017 ?Since last visit, patient underwent secondary liver disease workup which came back negative except for fatty liver and ultrasound elastography revealed moderate degree of fibrosis. She feels that her IBS symptoms have responded to VSL #3 but could not find it over-the-counter. her insurance did not approve amitriptyline. I switched to Zoloft and she took only one dose which caused headache. She is very anxious about her ultrasound results of the liver.she is here today to discuss about ultrasound results. Her husband is with her today. She wants to know if fatty liver and IBS are connected ?She is taking artificial sweeteners ? ?Follow-up visit 04/03/2018 ?She reports that her IBS symptoms are manageable. She tried amitriptyline 50 mg at bedtime which resulted in drowsiness all day next day.  She also tried IBgard which did not provide any symptom relief.  She ordered VSL#3 online, she reports that she is watching what she is eating. She did not lose any weight since last visit.  She denies any other complaints, otherwise ? ?Follow-up visit 09/06/2019 ?Patient continues to have ongoing loose bowel movements for which she takes half a pill of Imodium at bedtime daily.  She had 2 flareups in the last few months that have lasted for about 2 days.  She is interested to undergo colonoscopy for colon cancer screening.  She is also concerned about ongoing heartburn, epigastric discomfort.  She takes Pepcid as needed which does provide some relief. ? ?Follow-up visit 10/20/2019 ?Patient requested an urgent follow-up visit due to onset of diarrhea right after the colonoscopy performed on 09/27/2019.  Patient reports  that 2 days later, she started experiencing nonbloody bowel movements, several times a day which would last for 3 days followed by no bowel movement for 1 to 2 days.  She has been very frustrated with diarrhea as she cannot go out or visit her father.  Her weight has been stable.  She has been taking  Imodium as needed.  She denies consuming carbonated beverages ? ?Follow-up visit 05/23/2021 ?Patient is here for follow-up of IBS and fatty liver.  She reports that her IBS symptoms are manageable.  She reports having bouts of diarrhea every 3 to 4 days and not having any BM in between these episodes.  Her bowel movements initially started of hard, followed by loose and watery.  Patient does not have to take any stool softener.  She does report abdominal bloating.  Her weight has been stable.  She is trying to follow healthy diet.  Her liver enzymes are normal. ? ?Follow-up visit 09/05/2021 ?Patient is here for follow-up of chronic intermittent diarrhea.  She had a flareup of diarrhea in last week of December 2023, stool studies came back positive for C. difficile, treated with 10 days course of oral vancomycin 125 mg.  Had diarrhea resolved for few days, unfortunately, has recurred, most of the days she has 3-4 loose bowel movements and has been taking Imodium almost on a daily basis.  This has significantly impaired her quality of life, cannot go out with her family.  Her son has special needs.  Her weight has been stable. ? ?Follow-up visit 10/17/2021 ?Patient is here to undergo FMT in office today.  She had recurrent C. 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  First episode in 1/23.  She completed treatment for first recurrence, her diarrhea has resolved ? ? ?NSAIDs: None ? ?Antiplts/Anticoagulants/Anti thrombotics: None ? ?GI Procedures: Colonoscopy in 2009 in [place], normal, biopsies were not performed ?EGD and colonoscopy 09/27/2019 ?- Normal duodenal bulb and second portion of the duodenum. ?- Erosive gastropathy with stigmata of recent bleeding. Biopsied. ?- Esophagogastric landmarks identified. ?- Normal gastroesophageal junction and esophagus. ? ?- The examined portion of the ileum was normal. ?- The entire examined colon is normal. ?- The distal rectum and anal verge are normal on retroflexion  view. ?- No specimens collected. ? ?DIAGNOSIS:  ?A. STOMACH; COLD BIOPSY:  ?- ANTRAL AND OXYNTIC MUCOSA WITH MILD CHRONIC INACTIVE GASTRITIS.  ?- NEGATIVE FOR H. PYLORI BY IMMUNOHISTOCHEMISTRY (IHC).  ?- NEGATIVE FOR INTESTINAL METAPLASIA, ATROPHY, DYSPLASIA, AND  ?MALIGNANCY.  ? ?Comment:  ?There is a mild superficial lymphoplasmacytic inflammatory infiltrate,  ?more prominent in the antral samples.  No active inflammation is  ?identified, and IHC is negative for H. pylori bacteria.  ? ? ?She denies family history of GI malignancy, inflammatory bowel disease or celiac disease ? ?Past Medical History:  ?Diagnosis Date  ? Essential hypertension   ? Fibrocystic breast   ? Hyperlipidemia   ? IBS (irritable bowel syndrome)   ? Idiopathic hematuria   ? OSA (obstructive sleep apnea)   ? ? ?Past Surgical History:  ?Procedure Laterality Date  ? COLONOSCOPY WITH PROPOFOL N/A 09/27/2019  ? Procedure: COLONOSCOPY WITH PROPOFOL;  Surgeon: Lin Landsman, MD;  Location: [facility] ENDOSCOPY;  Service: Gastroenterology;  Laterality: N/A;  ? ESOPHAGOGASTRODUODENOSCOPY (EGD) WITH PROPOFOL N/A 09/27/2019  ? Procedure: ESOPHAGOGASTRODUODENOSCOPY (EGD) WITH PROPOFOL;  Surgeon: Lin Landsman, MD;  Location: [facility] ENDOSCOPY;  Service: Gastroenterology;  Laterality: N/A;  ? [place]  ? ? ?Current  Outpatient Medications:  ?  aspirin EC 81 MG tablet, Take 81 mg by mouth daily., Disp: , Rfl:  ?  Cholecalciferol (VITAMIN D3) 50 MCG (2000 UT) TABS, 1 tablet, Disp: , Rfl:  ?  cyanocobalamin 500 MCG tablet, Take 500 mcg by mouth daily., Disp: , Rfl:  ?  ezetimibe (ZETIA) 10 MG tablet, TAKE 1 TABLET BY MOUTH EVERY DAY FOR CHOLESTEROL, Disp: 90 tablet, Rfl: 2 ?  hydrOXYzine (ATARAX) 10 MG tablet, TAKE 1 TO 2 TABLETS BY MOUTH TWICE A DAY AS NEEDED FOR ANXIETY, Disp: 30 tablet, Rfl: 0 ?  loperamide (IMODIUM) 2 MG capsule, Taking 1/2 capsule by mouth at bedtime as needed, Disp: , Rfl:  ?  metoprolol succinate  (TOPROL-XL) 50 MG 24 hr tablet, Take 1 tablet (50 mg total) by mouth daily. For blood pressure, Disp: 90 tablet, Rfl: 2 ?  Omega-3 Fatty Acids (FISH OIL PO), Take 1,200 mg by mouth 2 (two) times daily., Disp: , Rfl:  ?

## 2021-10-18 ENCOUNTER — Encounter: Payer: Self-pay | Admitting: Gastroenterology

## 2021-11-08 ENCOUNTER — Encounter: Payer: Self-pay | Admitting: Family

## 2021-11-08 ENCOUNTER — Ambulatory Visit (INDEPENDENT_AMBULATORY_CARE_PROVIDER_SITE_OTHER): Payer: Medicare Other | Admitting: Family

## 2021-11-08 VITALS — BP 130/70 | HR 78 | Temp 98.2°F | Resp 16 | Ht 62.0 in | Wt 167.5 lb

## 2021-11-08 DIAGNOSIS — R051 Acute cough: Secondary | ICD-10-CM | POA: Insufficient documentation

## 2021-11-08 DIAGNOSIS — J0100 Acute maxillary sinusitis, unspecified: Secondary | ICD-10-CM | POA: Diagnosis not present

## 2021-11-08 MED ORDER — AMOXICILLIN-POT CLAVULANATE 875-125 MG PO TABS: 1.0000 | ORAL_TABLET | Freq: Two times a day (BID) | ORAL | 0 refills | Status: DC

## 2021-11-08 MED ORDER — BENZONATATE 200 MG PO CAPS: 200.0000 mg | ORAL_CAPSULE | Freq: Two times a day (BID) | ORAL | 0 refills | Status: DC | PRN

## 2021-11-08 MED ORDER — GUAIFENESIN-CODEINE 100-10 MG/5ML PO SYRP: 5.0000 mL | ORAL_SOLUTION | Freq: Three times a day (TID) | ORAL | 0 refills | Status: AC | PRN

## 2021-11-08 NOTE — Patient Instructions (Signed)
Antibiotic sent to preferred pharmacy.   Please increase oral fluids, steamy hot shower/humidifier prn.  Please follow up if no improvement in 2-3 days.   It was a pleasure seeing you today! Please do not hesitate to reach out with any questions and or concerns.  Regards,   Jaquisha Frech   

## 2021-11-08 NOTE — Assessment & Plan Note (Signed)
Prescription given for augmentin 875/125 mg po bid for ten days. Pt to continue tylenol/ibuprofen prn sinus pain. Continue with humidifier prn and steam showers recommended as well. instructed If no symptom improvement in 48 hours please f/u ? ?

## 2021-11-08 NOTE — Telephone Encounter (Signed)
I have called patient added to [place] today informed to wear mask in office due to symptoms.  ?

## 2021-11-08 NOTE — Progress Notes (Signed)
? ?Established Patient Office Visit ? ?Subjective:  ?Patient ID: Amy Wilcox, female    DOB: 09-26-1950  Age: 71 y.o. MRN: [id number] ? ?CC:  ?Chief Complaint  ?Patient presents with  ? Cough  ?  X 6 days with pink phlem covid was neg  ? ? ?HPI ?Amy Wilcox is here today with concerns.  ? ?5-6 days ago started with cough. Since is getting worse, and will not quit. Causing some sore throat as well. Much worse at night. Chest congestion mild. Nasal congestion, sinus pressure and no ear pain. Ears feel full bil. No fever no chills. No sob. No wheezing.  ? ?Covid tested and negative.  ? ?Past Medical History:  ?Diagnosis Date  ? Essential hypertension   ? Fibrocystic breast   ? Hyperlipidemia   ? IBS (irritable bowel syndrome)   ? Idiopathic hematuria   ? OSA (obstructive sleep apnea)   ? ? ?Past Surgical History:  ?Procedure Laterality Date  ? COLONOSCOPY WITH PROPOFOL N/A 09/27/2019  ? Procedure: COLONOSCOPY WITH PROPOFOL;  Surgeon: Lin Landsman, MD;  Location: [facility] ENDOSCOPY;  Service: Gastroenterology;  Laterality: N/A;  ? ESOPHAGOGASTRODUODENOSCOPY (EGD) WITH PROPOFOL N/A 09/27/2019  ? Procedure: ESOPHAGOGASTRODUODENOSCOPY (EGD) WITH PROPOFOL;  Surgeon: Lin Landsman, MD;  Location: [facility] ENDOSCOPY;  Service: Gastroenterology;  Laterality: N/A;  ? [place]  ? ? ?Family History  ?Problem Relation Age of Onset  ? Hypertension Mother   ? Hyperlipidemia Mother   ? Alzheimer's disease Mother   ? Heart disease Father   ? Heart attack Father   ? Hyperlipidemia Father   ? Hypertension Father   ? Breast cancer Neg Hx   ? ? ?Social History  ? ?Socioeconomic History  ? Marital status: Married  ?  Spouse name: Not on file  ? Number of children: Not on file  ? Years of education: Not on file  ? Highest education level: Not on file  ?Occupational History  ? Occupation: retired  ?Tobacco Use  ? Smoking status: Never  ? Smokeless tobacco: Never  ?Vaping Use  ? Vaping Use: Never used  ?Substance  and Sexual Activity  ? Alcohol use: No  ? Drug use: No  ? Sexual activity: Yes  ?Other Topics Concern  ? Not on file  ?Social History Narrative  ? Married.  ? 3 children.  ? Retired. Once worked a Agricultural engineer.  ? Enjoys spending time with family.  ? ?Social Determinants of Health  ? ?Financial Resource Strain: Low Risk   ? Difficulty of Paying Living Expenses: Not hard at all  ?Food Insecurity: No Food Insecurity  ? Worried About Charity fundraiser in the Last Year: Never true  ? Ran Out of Food in the Last Year: Never true  ?Transportation Needs: No Transportation Needs  ? Lack of Transportation (Medical): No  ? Lack of Transportation (Non-Medical): No  ?Physical Activity: Inactive  ? Days of Exercise per Week: 0 days  ? Minutes of Exercise per Session: 0 min  ?Stress: No Stress Concern Present  ? Feeling of Stress : Not at all  ?Social Connections: Not on file  ?Intimate Partner Violence: Not At Risk  ? Fear of Current or Ex-Partner: No  ? Emotionally Abused: No  ? Physically Abused: No  ? Sexually Abused: No  ? ? ?Outpatient Medications Prior to Visit  ?Medication Sig Dispense Refill  ? aspirin EC 81 MG tablet Take 81 mg by mouth daily.    ? Cholecalciferol (VITAMIN  D3) 50 MCG (2000 UT) TABS 1 tablet    ? cyanocobalamin 500 MCG tablet Take 500 mcg by mouth daily.    ? ezetimibe (ZETIA) 10 MG tablet TAKE 1 TABLET BY MOUTH EVERY DAY FOR CHOLESTEROL 90 tablet 2  ? hydrOXYzine (ATARAX) 10 MG tablet TAKE 1 TO 2 TABLETS BY MOUTH TWICE A DAY AS NEEDED FOR ANXIETY 30 tablet 0  ? loperamide (IMODIUM) 2 MG capsule Taking 1/2 capsule by mouth at bedtime as needed    ? metoprolol succinate (TOPROL-XL) 50 MG 24 hr tablet Take 1 tablet (50 mg total) by mouth daily. For blood pressure 90 tablet 2  ? Omega-3 Fatty Acids (FISH OIL PO) Take 1,200 mg by mouth 2 (two) times daily.    ? Oyster Shell Calcium 500 MG TABS     ? valsartan-hydrochlorothiazide (DIOVAN-HCT) 80-12.5 MG tablet TAKE 1 TABLET BY MOUTH EVERY DAY FOR BLOOD  PRESSURE 90 tablet 3  ? ?No facility-administered medications prior to visit.  ? ? ?Allergies  ?Allergen Reactions  ? Livalo [Pitavastatin]   ? Statins   ? ? ?ROS ?Review of Systems  ?Constitutional:  Negative for chills and fever.  ?HENT:  Positive for congestion, ear pain, postnasal drip, sinus pressure, sinus pain and sore throat.   ?Respiratory:  Positive for cough and chest tightness (mild). Negative for shortness of breath and wheezing.   ?Cardiovascular:  Negative for chest pain and palpitations.  ? ?  ?Objective:  ?  ?Physical Exam ?Constitutional:   ?   General: She is not in acute distress. ?   Appearance: Normal appearance. She is obese. She is not ill-appearing, toxic-appearing or diaphoretic.  ?HENT:  ?   Head: Normocephalic.  ?   Right Ear: Tympanic membrane normal.  ?   Left Ear: Tympanic membrane normal.  ?   Nose:  ?   Right Sinus: Maxillary sinus tenderness and frontal sinus tenderness present.  ?   Left Sinus: Maxillary sinus tenderness and frontal sinus tenderness present.  ?   Mouth/Throat:  ?   Mouth: Mucous membranes are moist.  ?   Pharynx: Posterior oropharyngeal erythema present.  ?   Tonsils: No tonsillar exudate. 0 on the right. 0 on the left.  ?Eyes:  ?   Extraocular Movements: Extraocular movements intact.  ?   Conjunctiva/sclera: Conjunctivae normal.  ?   Pupils: Pupils are equal, round, and reactive to light.  ?Cardiovascular:  ?   Rate and Rhythm: Normal rate and regular rhythm.  ?Pulmonary:  ?   Effort: Pulmonary effort is normal.  ?Neurological:  ?   Mental Status: She is alert.  ? ? ?BP 130/70   Pulse 78   Temp 98.2 ?F (36.8 ?C)   Resp 16   Ht '5\' 2"'$  (1.575 m)   Wt 167 lb 8 oz (76 kg)   SpO2 98%   BMI 30.64 kg/m?  ?Wt Readings from Last 3 Encounters:  ?11/08/21 167 lb 8 oz (76 kg)  ?10/17/21 160 lb (72.6 kg)  ?09/05/21 166 lb 6 oz (75.5 kg)  ? ? ? ?Health Maintenance Due  ?Topic Date Due  ? Zoster Vaccines- Shingrix (1 of 2) Never done  ? COVID-19 Vaccine (3 - Pfizer risk  series) 11/08/2019  ? ? ?There are no preventive care reminders to display for this patient. ? ?Lab Results  ?Component Value Date  ? TSH 0.95 05/11/2018  ? ?Lab Results  ?Component Value Date  ? WBC 6.1 02/02/2021  ? HGB 14.9 02/02/2021  ?  HCT 42.0 02/02/2021  ? MCV 86.0 02/02/2021  ? PLT 209.0 02/02/2021  ? ?Lab Results  ?Component Value Date  ? NA 140 02/02/2021  ? K 3.6 02/02/2021  ? CO2 28 02/02/2021  ? GLUCOSE 134 (H) 02/02/2021  ? BUN 25 (H) 02/02/2021  ? CREATININE 0.85 02/02/2021  ? BILITOT 0.5 09/05/2021  ? ALKPHOS 36 (L) 09/05/2021  ? AST 22 09/05/2021  ? ALT 24 09/05/2021  ? PROT 7.4 09/05/2021  ? ALBUMIN 5.0 (H) 09/05/2021  ? CALCIUM 9.7 02/02/2021  ? GFR 69.91 02/02/2021  ? ?Lab Results  ?Component Value Date  ? HGBA1C 6.1 08/21/2021  ? ? ?  ?Assessment & Plan:  ? ?Problem List Items Addressed This Visit   ? ?  ? Respiratory  ? Acute non-recurrent maxillary sinusitis  ?  Prescription given for augmentin 875/125 mg po bid for ten days. Pt to continue tylenol/ibuprofen prn sinus pain. Continue with humidifier prn and steam showers recommended as well. instructed If no symptom improvement in 48 hours please f/u ? ? ?  ?  ? Relevant Medications  ? amoxicillin-clavulanate (AUGMENTIN) 875-125 MG tablet  ? guaiFENesin-codeine (ROBITUSSIN AC) 100-10 MG/5ML syrup  ? benzonatate (TESSALON) 200 MG capsule  ?  ? Other  ? Acute cough - Primary  ?  rx gua codeine 100-10 ?Also benzonatate 200 mg tid prn cough for daytime ? ? ?  ?  ? Relevant Medications  ? amoxicillin-clavulanate (AUGMENTIN) 875-125 MG tablet  ? guaiFENesin-codeine (ROBITUSSIN AC) 100-10 MG/5ML syrup  ? benzonatate (TESSALON) 200 MG capsule  ? ? ?Meds ordered this encounter  ?Medications  ? amoxicillin-clavulanate (AUGMENTIN) 875-125 MG tablet  ?  Sig: Take 1 tablet by mouth 2 (two) times daily.  ?  Dispense:  20 tablet  ?  Refill:  0  ?  Order Specific Question:   Supervising Provider  ?  Answer:   BEDSOLE, AMY E [[id number]]  ? guaiFENesin-codeine  (ROBITUSSIN AC) 100-10 MG/5ML syrup  ?  Sig: Take 5 mLs by mouth 3 (three) times daily as needed for up to 5 days for cough.  ?  Dispense:  75 mL  ?  Refill:  0  ?  Order Specific Question:   Supervising Provider  ?  Answer

## 2021-11-08 NOTE — Assessment & Plan Note (Signed)
rx gua codeine 100-10 ?Also benzonatate 200 mg tid prn cough for daytime ? ?

## 2021-11-11 DIAGNOSIS — Z20822 Contact with and (suspected) exposure to covid-19: Secondary | ICD-10-CM | POA: Diagnosis not present

## 2021-11-19 ENCOUNTER — Emergency Department
Admission: EM | Admit: 2021-11-19 | Discharge: 2021-11-19 | Disposition: A | Discharge status: Home / Self Care | Payer: Medicare Other | Attending: Emergency Medicine | Admitting: Emergency Medicine

## 2021-11-19 ENCOUNTER — Emergency Department: Payer: Medicare Other

## 2021-11-19 ENCOUNTER — Other Ambulatory Visit: Payer: Self-pay

## 2021-11-19 DIAGNOSIS — I10 Essential (primary) hypertension: Secondary | ICD-10-CM | POA: Insufficient documentation

## 2021-11-19 DIAGNOSIS — R0789 Other chest pain: Secondary | ICD-10-CM | POA: Diagnosis not present

## 2021-11-19 DIAGNOSIS — R059 Cough, unspecified: Secondary | ICD-10-CM | POA: Diagnosis not present

## 2021-11-19 DIAGNOSIS — R079 Chest pain, unspecified: Secondary | ICD-10-CM | POA: Diagnosis not present

## 2021-11-19 LAB — CBC WITH DIFFERENTIAL/PLATELET
Abs Immature Granulocytes: 0.04 10*3/uL (ref 0.00–0.07)
Basophils Absolute: 0 10*3/uL (ref 0.0–0.1)
Basophils Relative: 1 %
Eosinophils Absolute: 0.2 10*3/uL (ref 0.0–0.5)
Eosinophils Relative: 3 %
HCT: 44.1 % (ref 36.0–46.0)
Hemoglobin: 15.1 g/dL — ABNORMAL HIGH (ref 12.0–15.0)
Immature Granulocytes: 1 %
Lymphocytes Relative: 49 %
Lymphs Abs: 3 10*3/uL (ref 0.7–4.0)
MCH: 29.6 pg (ref 26.0–34.0)
MCHC: 34.2 g/dL (ref 30.0–36.0)
MCV: 86.5 fL (ref 80.0–100.0)
Monocytes Absolute: 0.4 10*3/uL (ref 0.1–1.0)
Monocytes Relative: 7 %
Neutro Abs: 2.4 10*3/uL (ref 1.7–7.7)
Neutrophils Relative %: 39 %
Platelets: 230 10*3/uL (ref 150–400)
RBC: 5.1 MIL/uL (ref 3.87–5.11)
RDW: 13.1 % (ref 11.5–15.5)
WBC: 6 10*3/uL (ref 4.0–10.5)
nRBC: 0 % (ref 0.0–0.2)

## 2021-11-19 LAB — COMPREHENSIVE METABOLIC PANEL
ALT: 29 U/L (ref 0–44)
AST: 25 U/L (ref 15–41)
Albumin: 4 g/dL (ref 3.5–5.0)
Alkaline Phosphatase: 34 U/L — ABNORMAL LOW (ref 38–126)
Anion gap: 11 (ref 5–15)
BUN: 14 mg/dL (ref 8–23)
CO2: 26 mmol/L (ref 22–32)
Calcium: 9.6 mg/dL (ref 8.9–10.3)
Chloride: 103 mmol/L (ref 98–111)
Creatinine, Ser: 0.76 mg/dL (ref 0.44–1.00)
GFR, Estimated: >60 mL/min (ref >60)
Glucose, Bld: 178 mg/dL — ABNORMAL HIGH (ref 70–99)
Potassium: 3.5 mmol/L (ref 3.5–5.1)
Sodium: 140 mmol/L (ref 135–145)
Total Bilirubin: 0.8 mg/dL (ref 0.3–1.2)
Total Protein: 7.3 g/dL (ref 6.5–8.1)

## 2021-11-19 LAB — TROPONIN I (HIGH SENSITIVITY): Troponin I (High Sensitivity): 3 ng/L (ref <18)

## 2021-11-19 NOTE — ED Provider Notes (Signed)
? ?[facility] ?Provider Note ? ? ? Event Date/Time  ? First MD Initiated Contact with Patient 11/19/21 1506   ?  (approximate) ? ? ?History  ? ?Chief Complaint ?Chest Pain ? ? ?HPI ? ?Amy Wilcox is a 71 y.o. female with past medical history of hypertension, hyperlipidemia, GERD, OSA, and anxiety who presents to the ED complaining of chest pain.  Patient reports that for the past 2 days she is dealing with intermittent sharp pain in the left side of her chest.  She states that it can come on at any time, last for a few seconds before resolving on its own.  Pain does not seem to be exacerbated by exertion or taking a deep breath and she denies any difficulty breathing.  She does state that she was dealing with malaise, cough, and congestion last week, subsequently completed a course of Augmentin for a sinus infection yesterday.  She states that the cough and congestion have improved and she was feeling much better until the pain started yesterday.  She denies any history of similar symptoms and denies any cardiac history.  She does not have any pain in her chest currently. ?  ? ? ?Physical Exam  ? ?Triage Vital Signs: ?ED Triage Vitals  ?Enc Vitals Group  ?   BP 11/19/21 1308 (!) 111/99  ?   Pulse Rate 11/19/21 1308 87  ?   Resp 11/19/21 1308 16  ?   Temp 11/19/21 1308 98 ?F (36.7 ?C)  ?   Temp Source 11/19/21 1308 Oral  ?   SpO2 11/19/21 1308 97 %  ?   Weight 11/19/21 1309 164 lb (74.4 kg)  ?   Height 11/19/21 1309 '5\' 3"'$  (1.6 m)  ?   Head Circumference --   ?   Peak Flow --   ?   Pain Score 11/19/21 1309 7  ?   Pain Loc --   ?   Pain Edu? --   ?   Excl. in [place]? --   ? ? ?Most recent vital signs: ?Vitals:  ? 11/19/21 1308 11/19/21 1517  ?BP: (!) 111/99 118/90  ?Pulse: 87 88  ?Resp: 16 16  ?Temp: 98 ?F (36.7 ?C)   ?SpO2: 97% 98%  ? ? ?Constitutional: Alert and oriented. ?Eyes: Conjunctivae are normal. ?Head: Atraumatic. ?Nose: No congestion/rhinnorhea. ?Mouth/Throat: Mucous membranes are moist.   ?Cardiovascular: Normal rate, regular rhythm. Grossly normal heart sounds.  2+ radial pulses bilaterally. ?Respiratory: Normal respiratory effort.  No retractions. Lungs CTAB.  No chest wall tenderness to palpation. ?Gastrointestinal: Soft and nontender. No distention. ?Musculoskeletal: No lower extremity tenderness nor edema.  ?Neurologic:  Normal speech and language. No gross focal neurologic deficits are appreciated. ? ? ? ?ED Results / Procedures / Treatments  ? ?Labs ?(all labs ordered are listed, but only abnormal results are displayed) ?Labs Reviewed  ?COMPREHENSIVE METABOLIC PANEL - Abnormal; Notable for the following components:  ?    Result Value  ? Glucose, Bld 178 (*)   ? Alkaline Phosphatase 34 (*)   ? All other components within normal limits  ?CBC WITH DIFFERENTIAL/PLATELET - Abnormal; Notable for the following components:  ? Hemoglobin 15.1 (*)   ? All other components within normal limits  ?TROPONIN I (HIGH SENSITIVITY)  ? ? ? ?EKG ? ?ED ECG REPORT ?Tempie Hoist, the attending physician, personally viewed and interpreted this ECG. ? ? Date: 11/19/2021 ? EKG Time: 13:06 ? Rate: 85 ? Rhythm: normal sinus rhythm ? Axis:  Normal ? Intervals:none ? ST&T Change: None ? ?RADIOLOGY ?Chest x-ray reviewed and interpreted by me with no infiltrate, edema, or effusion. ? ?PROCEDURES: ? ?Critical Care performed: No ? ?Procedures ? ? ?MEDICATIONS ORDERED IN ED: ?Medications - No data to display ? ? ?IMPRESSION / MDM / ASSESSMENT AND PLAN / ED COURSE  ?I reviewed the triage vital signs and the nursing notes. ?             ?               ? ?71 y.o. female with past medical history of hypertension, hyperlipidemia, OSA, GERD, and anxiety who presents to the ED complaining of intermittent sharp pain in the left side of her chest for the past 2 days that comes on for a few seconds at a time before resolving on its own. ? ?Differential diagnosis includes, but is not limited to, ACS, PE, pneumonia, pneumothorax,  dissection, pleurisy, bronchitis, musculoskeletal pain, GERD, and anxiety. ? ?Patient well-appearing and in no acute distress, vital signs are unremarkable and EKG shows no evidence of arrhythmia or ischemia.  Very low suspicion for ACS given atypical symptoms lasting only a few seconds at a time.  Given symptoms for 2 days with negative troponin, I do not feel repeat troponin is indicated.  Chest x-ray is unremarkable and remainder of labs are reassuring with CBC showing no anemia or leukocytosis, BMP without electrolyte abnormality or AKI.  Symptoms may be related to mild chest wall inflammation from her recent bronchitis.  She is appropriate for discharge home with PCP follow-up, was counseled to return to the ED for new worsening symptoms.  Patient agrees with plan. ? ?  ? ? ?FINAL CLINICAL IMPRESSION(S) / ED DIAGNOSES  ? ?Final diagnoses:  ?Atypical chest pain  ? ? ? ?Rx / DC Orders  ? ?ED Discharge Orders   ? ? None  ? ?  ? ? ? ?Note:  This document was prepared using Dragon voice recognition software and may include unintentional dictation errors. ?  ?Blake Divine, MD ?11/19/21 1648 ? ?

## 2021-11-19 NOTE — ED Provider Triage Note (Signed)
Emergency Medicine Provider Triage Evaluation Note ? ?Amy Wilcox , a 71 y.o. female  was evaluated in triage.  Pt complains of chest pain and some shortness of breath. ? ?Review of Systems  ?Positive: Chest pain ?Negative: Swelling in ankles ? ?Physical Exam  ?There were Wilcox vitals taken for this visit. ?Gen:   Awake, Wilcox distress   ?Resp:  Normal effort  ?MSK:   Moves extremities without difficulty  ?Other:   ? ?Medical Decision Making  ?Medically screening exam initiated at 1:08 PM.  Appropriate orders placed.  Amy Wilcox was informed that the remainder of the evaluation will be completed by another provider, this initial triage assessment does not replace that evaluation, and the importance of remaining in the ED until their evaluation is complete. ? ? ?  ?Versie Starks, PA-C ?11/19/21 1309 ? ?

## 2021-11-19 NOTE — ED Triage Notes (Addendum)
Pt here with cp that started a while ago. Pt states pain is sharp and intermittent but denies SOB. Pt also c/o cough. Pt denies pain radiation. Pt also has hx of IBS and is being treated but states diarrhea for the past 3 days. ?

## 2021-11-27 ENCOUNTER — Telehealth: Payer: Self-pay

## 2021-11-27 NOTE — Telephone Encounter (Signed)
I received a note from front desk that a mychart appt had been scheduled for 11/29/21 with T Dugal FNP for CP and cough and I spoke with pt and she is in no distress but has an annoying and lingering cough with random sharp pain in chest on and off for 1 wk. Pt said seen [facility] ED on 11/19/21 with chest pain and pt was assured by ED that CP was not heart related. Pt does request to see Red Christians FNP and rescheduled appt with Red Christians FNP on 11/28/21 at 10:40 with UC & ED precautions and pt voiced understanding. Sending note to T Dugal FNP and Claiborne Billings CMA.

## 2021-11-28 ENCOUNTER — Encounter: Payer: Self-pay | Admitting: Family

## 2021-11-28 ENCOUNTER — Ambulatory Visit (INDEPENDENT_AMBULATORY_CARE_PROVIDER_SITE_OTHER): Payer: Medicare Other | Admitting: Family

## 2021-11-28 VITALS — BP 126/70 | HR 65 | Temp 98.8°F | Resp 16 | Ht 63.0 in | Wt 166.4 lb

## 2021-11-28 DIAGNOSIS — K219 Gastro-esophageal reflux disease without esophagitis: Secondary | ICD-10-CM | POA: Diagnosis not present

## 2021-11-28 DIAGNOSIS — R1013 Epigastric pain: Secondary | ICD-10-CM

## 2021-11-28 DIAGNOSIS — K2100 Gastro-esophageal reflux disease with esophagitis, without bleeding: Secondary | ICD-10-CM

## 2021-11-28 MED ORDER — PANTOPRAZOLE SODIUM 40 MG PO TBEC
40.0000 mg | DELAYED_RELEASE_TABLET | Freq: Every day | ORAL | 0 refills | Status: DC
Start: 2021-11-28 — End: 2021-12-23

## 2021-11-28 NOTE — Telephone Encounter (Signed)
Saw patient already, thank you for speaking with the patient.  Please see note for further information.  

## 2021-11-28 NOTE — Patient Instructions (Signed)
Recommend taking nightly zyrtec Continue flonase  Trial protonix, take daily for thirty days  Try to decrease and or avoid spicy foods, fried fatty foods, and also caffeine and chocolate as these can increase heartburn symptoms.   Due to recent changes in healthcare laws, you may see results of your imaging and/or laboratory studies on MyChart before I have had a chance to review them.  I understand that in some cases there may be results that are confusing or concerning to you. Please understand that not all results are received at the same time and often I may need to interpret multiple results in order to provide you with the best plan of care or course of treatment. Therefore, I ask that you please give me 2 business days to thoroughly review all your results before contacting my office for clarification. Should we see a critical lab result, you will be contacted sooner.   It was a pleasure seeing you today! Please do not hesitate to reach out with any questions and or concerns.  Regards,   Eugenia Pancoast FNP-C

## 2021-11-28 NOTE — Assessment & Plan Note (Signed)
Suspected heartburn ppi trial

## 2021-11-28 NOTE — Telephone Encounter (Signed)
Pt has an appt today in office 11/28/2021 '@10'$ :40

## 2021-11-28 NOTE — Progress Notes (Signed)
Established Patient Office Visit  Subjective:  Patient ID: Amy Wilcox, female    DOB: [birth year]  Age: 71 y.o. MRN: [id number]  CC:  Chief Complaint  Patient presents with   Cough    Dry cough X 3 weeks    Chest Pain    Pain had a EKG at the hospital.    HPI Amy Wilcox is here today for follow up.   Was seen 5/4 for sinusitis : given augmentin for ten days felt resolution after completion for the most part but still with dry hacking cough. Still with some post nasal drip.   She is having weird pains in her chest, went to [facility] on 5/15 for the chest pain. Doesn't seem to worsen with movement. Not feeling overly anxious os stressed. She does worry as with heart problems in her family history. Not currently with the pain right. Occurs a few times throughout the day, yesterday only once. Day prior about three times. Doesn't drink caffeine. Feels surface level of chest. Does have heart burn, seems to rotate different areas of her chest. Not aggravated by movement.   At er: Cxr negative for acute findings, no infiltrates  Ekg NSR , negatrive troponin  Lab Results  Component Value Date   WBC 6.0 11/19/2021   HGB 15.1 (H) 11/19/2021   HCT 44.1 11/19/2021   MCV 86.5 11/19/2021   PLT 230 11/19/2021       Past Medical History:  Diagnosis Date   Essential hypertension    Fibrocystic breast    Hyperlipidemia    IBS (irritable bowel syndrome)    Idiopathic hematuria    OSA (obstructive sleep apnea)     Past Surgical History:  Procedure Laterality Date   COLONOSCOPY WITH PROPOFOL N/A 09/27/2019   Procedure: COLONOSCOPY WITH PROPOFOL;  Surgeon: Lin Landsman, MD;  Location: [facility] ENDOSCOPY;  Service: Gastroenterology;  Laterality: N/A;   ESOPHAGOGASTRODUODENOSCOPY (EGD) WITH PROPOFOL N/A 09/27/2019   Procedure: ESOPHAGOGASTRODUODENOSCOPY (EGD) WITH PROPOFOL;  Surgeon: Lin Landsman, MD;  Location: [facility] ENDOSCOPY;  Service: Gastroenterology;  Laterality: N/A;    TONSILLECTOMY AND ADENOIDECTOMY  1961    Family History  Problem Relation Age of Onset   Hypertension Mother    Hyperlipidemia Mother    Alzheimer's disease Mother    Heart disease Father    Heart attack Father    Hyperlipidemia Father    Hypertension Father    Breast cancer Neg Hx     Social History   Socioeconomic History   Marital status: Married    Spouse name: Not on file   Number of children: Not on file   Years of education: Not on file   Highest education level: Not on file  Occupational History   Occupation: retired  Tobacco Use   Smoking status: Never   Smokeless tobacco: Never  Vaping Use   Vaping Use: Never used  Substance and Sexual Activity   Alcohol use: No   Drug use: No   Sexual activity: Yes  Other Topics Concern   Not on file  Social History Narrative   Married.   3 children.   Retired. Once worked a Agricultural engineer.   Enjoys spending time with family.   Social Determinants of Health   Financial Resource Strain: Low Risk    Difficulty of Paying Living Expenses: Not hard at all  Food Insecurity: No Food Insecurity   Worried About Charity fundraiser in the Last Year: Never true   [employer]  in the Last Year: Never true  Transportation Needs: No Transportation Needs   Lack of Transportation (Medical): No   Lack of Transportation (Non-Medical): No  Physical Activity: Inactive   Days of Exercise per Week: 0 days   Minutes of Exercise per Session: 0 min  Stress: No Stress Concern Present   Feeling of Stress : Not at all  Social Connections: Not on file  Intimate Partner Violence: Not At Risk   Fear of Current or Ex-Partner: No   Emotionally Abused: No   Physically Abused: No   Sexually Abused: No    Outpatient Medications Prior to Visit  Medication Sig Dispense Refill   aspirin EC 81 MG tablet Take 81 mg by mouth daily.     Cholecalciferol (VITAMIN D3) 50 MCG (2000 UT) TABS 1 tablet     cyanocobalamin 500 MCG tablet Take 500 mcg by  mouth daily.     ezetimibe (ZETIA) 10 MG tablet TAKE 1 TABLET BY MOUTH EVERY DAY FOR CHOLESTEROL 90 tablet 2   hydrOXYzine (ATARAX) 10 MG tablet TAKE 1 TO 2 TABLETS BY MOUTH TWICE A DAY AS NEEDED FOR ANXIETY 30 tablet 0   loperamide (IMODIUM) 2 MG capsule Taking 1/2 capsule by mouth at bedtime as needed     metoprolol succinate (TOPROL-XL) 50 MG 24 hr tablet Take 1 tablet (50 mg total) by mouth daily. For blood pressure 90 tablet 2   Omega-3 Fatty Acids (FISH OIL PO) Take 1,200 mg by mouth 2 (two) times daily.     Oyster Shell Calcium 500 MG TABS      valsartan-hydrochlorothiazide (DIOVAN-HCT) 80-12.5 MG tablet TAKE 1 TABLET BY MOUTH EVERY DAY FOR BLOOD PRESSURE 90 tablet 3   amoxicillin-clavulanate (AUGMENTIN) 875-125 MG tablet Take 1 tablet by mouth 2 (two) times daily. (Patient not taking: Reported on 11/28/2021) 20 tablet 0   benzonatate (TESSALON) 200 MG capsule Take 1 capsule (200 mg total) by mouth 2 (two) times daily as needed for cough. (Patient not taking: Reported on 11/28/2021) 20 capsule 0   No facility-administered medications prior to visit.    Allergies  Allergen Reactions   Livalo [Pitavastatin]    Statins     ROS Review of Systems  Constitutional:  Negative for chills, fatigue and fever.  HENT:  Positive for postnasal drip. Negative for congestion, ear pain, rhinorrhea, sinus pressure, sinus pain, sneezing and sore throat.   Eyes:  Negative for discharge.  Respiratory:  Positive for cough (dry hacking cough). Negative for chest tightness, shortness of breath and wheezing.   Cardiovascular:  Negative for chest pain and palpitations.  Gastrointestinal:        Increased heart burn     Objective:    Physical Exam Constitutional:      General: She is not in acute distress.    Appearance: Normal appearance. She is not ill-appearing.  HENT:     Right Ear: Tympanic membrane normal.     Left Ear: Tympanic membrane normal.     Nose: Nose normal. No congestion or  rhinorrhea.     Right Turbinates: Not enlarged or swollen.     Left Turbinates: Not enlarged or swollen.     Right Sinus: No maxillary sinus tenderness or frontal sinus tenderness.     Left Sinus: No maxillary sinus tenderness or frontal sinus tenderness.     Mouth/Throat:     Mouth: Mucous membranes are moist.     Pharynx: No pharyngeal swelling, oropharyngeal exudate or posterior oropharyngeal erythema.  Tonsils: No tonsillar exudate.  Eyes:     Extraocular Movements: Extraocular movements intact.     Conjunctiva/sclera: Conjunctivae normal.     Pupils: Pupils are equal, round, and reactive to light.  Neck:     Thyroid: No thyroid mass.  Cardiovascular:     Rate and Rhythm: Normal rate and regular rhythm.  Pulmonary:     Effort: Pulmonary effort is normal.     Breath sounds: Normal breath sounds.  Abdominal:     Tenderness: There is abdominal tenderness (epigastric mild).  Lymphadenopathy:     Cervical:     Right cervical: No superficial cervical adenopathy.    Left cervical: No superficial cervical adenopathy.  Neurological:     Mental Status: She is alert.      BP 126/70   Pulse 65   Temp 98.8 F (37.1 C)   Resp 16   Ht '5\' 3"'$  (1.6 m)   Wt 166 lb 6 oz (75.5 kg)   SpO2 97%   BMI 29.47 kg/m  Wt Readings from Last 3 Encounters:  11/28/21 166 lb 6 oz (75.5 kg)  11/19/21 164 lb (74.4 kg)  11/08/21 167 lb 8 oz (76 kg)     Health Maintenance Due  Topic Date Due   Zoster Vaccines- Shingrix (1 of 2) Never done   COVID-19 Vaccine (3 - Pfizer risk series) 11/08/2019    There are no preventive care reminders to display for this patient.  Lab Results  Component Value Date   TSH 0.95 05/11/2018   Lab Results  Component Value Date   WBC 6.0 11/19/2021   HGB 15.1 (H) 11/19/2021   HCT 44.1 11/19/2021   MCV 86.5 11/19/2021   PLT 230 11/19/2021   Lab Results  Component Value Date   NA 140 11/19/2021   K 3.5 11/19/2021   CO2 26 11/19/2021   GLUCOSE 178 (H)  11/19/2021   BUN 14 11/19/2021   CREATININE 0.76 11/19/2021   BILITOT 0.8 11/19/2021   ALKPHOS 34 (L) 11/19/2021   AST 25 11/19/2021   ALT 29 11/19/2021   PROT 7.3 11/19/2021   ALBUMIN 4.0 11/19/2021   CALCIUM 9.6 11/19/2021   ANIONGAP 11 11/19/2021   GFR 69.91 02/02/2021   Lab Results  Component Value Date   CHOL 221 (H) 08/21/2021   Lab Results  Component Value Date   HDL 44.50 08/21/2021   Lab Results  Component Value Date   LDLCALC 138 (H) 04/01/2018   Lab Results  Component Value Date   TRIG 240.0 (H) 08/21/2021   Lab Results  Component Value Date   CHOLHDL 5 08/21/2021   Lab Results  Component Value Date   HGBA1C 6.1 08/21/2021      Assessment & Plan:   Problem List Items Addressed This Visit       Digestive   Gastroesophageal reflux disease with esophagitis without hemorrhage    Suspected epigastric pain/heart burn causing chest sensations as cxr and ekg reviewed, stable without acute findings from ER visit.   Start protonix 40 mg once daily. Try to decrease and or avoid spicy foods, fried fatty foods, and also caffeine and chocolate as these can increase heartburn symptoms.  Handout given to pt with diet        RESOLVED: Gastroesophageal reflux disease - Primary   Relevant Medications   pantoprazole (PROTONIX) 40 MG tablet     Other   Epigastric abdominal pain    Suspected heartburn ppi trial  Relevant Medications   pantoprazole (PROTONIX) 40 MG tablet    Meds ordered this encounter  Medications   pantoprazole (PROTONIX) 40 MG tablet    Sig: Take 1 tablet (40 mg total) by mouth daily.    Dispense:  30 tablet    Refill:  0    Order Specific Question:   Supervising Provider    Answer:   BEDSOLE, AMY E [[id number]]    Follow-up: Return if symptoms worsen or fail to improve with pcp.    Eugenia Pancoast, FNP

## 2021-11-28 NOTE — Assessment & Plan Note (Signed)
Suspected epigastric pain/heart burn causing chest sensations as cxr and ekg reviewed, stable without acute findings from ER visit.   Start protonix 40 mg once daily. Try to decrease and or avoid spicy foods, fried fatty foods, and also caffeine and chocolate as these can increase heartburn symptoms.  Handout given to pt with diet

## 2021-11-29 ENCOUNTER — Ambulatory Visit: Payer: Medicare Other | Admitting: Family

## 2021-11-30 ENCOUNTER — Encounter: Payer: Self-pay | Admitting: Gastroenterology

## 2021-12-15 ENCOUNTER — Other Ambulatory Visit: Payer: Self-pay | Admitting: Primary Care

## 2021-12-15 DIAGNOSIS — E785 Hyperlipidemia, unspecified: Secondary | ICD-10-CM

## 2021-12-15 DIAGNOSIS — I10 Essential (primary) hypertension: Secondary | ICD-10-CM

## 2021-12-20 ENCOUNTER — Other Ambulatory Visit: Payer: Self-pay | Admitting: Family

## 2021-12-20 DIAGNOSIS — R1013 Epigastric pain: Secondary | ICD-10-CM

## 2021-12-20 DIAGNOSIS — K219 Gastro-esophageal reflux disease without esophagitis: Secondary | ICD-10-CM

## 2022-01-06 ENCOUNTER — Other Ambulatory Visit: Payer: Self-pay | Admitting: Family

## 2022-01-06 DIAGNOSIS — R1013 Epigastric pain: Secondary | ICD-10-CM

## 2022-01-06 DIAGNOSIS — K219 Gastro-esophageal reflux disease without esophagitis: Secondary | ICD-10-CM

## 2022-01-31 ENCOUNTER — Ambulatory Visit (INDEPENDENT_AMBULATORY_CARE_PROVIDER_SITE_OTHER): Payer: Medicare Other

## 2022-01-31 VITALS — Ht 62.0 in | Wt 167.0 lb

## 2022-01-31 DIAGNOSIS — Z0000 Encounter for general adult medical examination without abnormal findings: Secondary | ICD-10-CM

## 2022-01-31 NOTE — Progress Notes (Signed)
I connected with [name] today by telephone and verified that I am speaking with the correct person using two identifiers. Location patient: home Location provider: work Persons participating in the virtual visit: Gabriella Woodhead, Glenna Durand LPN.   I discussed the limitations, risks, security and privacy concerns of performing an evaluation and management service by telephone and the availability of in person appointments. I also discussed with the patient that there may be a patient responsible charge related to this service. The patient expressed understanding and verbally consented to this telephonic visit.    Interactive audio and video telecommunications were attempted between this provider and patient, however failed, due to patient having technical difficulties OR patient did not have access to video capability.  We continued and completed visit with audio only.     Vital signs may be patient reported or missing.  Subjective:   Amy Wilcox is a 71 y.o. female who presents for Medicare Annual (Subsequent) preventive examination.  Review of Systems     Cardiac Risk Factors include: advanced age (>63mn, >>19women);dyslipidemia;hypertension;obesity (BMI >30kg/m2)     Objective:    Today's Vitals   01/31/22 0941  Weight: 167 lb (75.8 kg)  Height: '5\' 2"'$  (1.575 m)   Body mass index is 30.54 kg/m.     01/31/2022    9:45 AM 11/19/2021    1:09 PM 01/30/2021   10:26 AM 01/28/2020   10:29 AM 09/27/2019    8:06 AM 01/25/2019   11:08 AM 09/22/2017    8:24 AM  Advanced Directives  Does Patient Have a Medical Advance Directive? Yes No Yes Yes Yes Yes Yes  Type of AParamedicof [place]olog[place]Living will  [place] will [place]Living will Living will [place]ast PrairieLiving will [place] ShoreLiving will  Does patient want to make changes to medical advance directive?      No - Patient declined    Copy of H[place]in Chart? No - copy requested  No - copy requested No - copy requested  No - copy requested No - copy requested  Would patient like information on creating a medical advance directive?  No - Patient declined         Current Medications (verified) Outpatient Encounter Medications as of 01/31/2022  Medication Sig   aspirin EC 81 MG tablet Take 81 mg by mouth daily.   Cholecalciferol (VITAMIN D3) 50 MCG (2000 UT) TABS 1 tablet   cyanocobalamin 500 MCG tablet Take 500 mcg by mouth daily.   ezetimibe (ZETIA) 10 MG tablet TAKE 1 TABLET BY MOUTH EVERY DAY FOR CHOLESTEROL   hydrOXYzine (ATARAX) 10 MG tablet TAKE 1 TO 2 TABLETS BY MOUTH TWICE A DAY AS NEEDED FOR ANXIETY   loperamide (IMODIUM) 2 MG capsule Taking 1/2 capsule by mouth at bedtime as needed   metoprolol succinate (TOPROL-XL) 50 MG 24 hr tablet TAKE 1 TABLET (50 MG TOTAL) BY MOUTH DAILY. FOR BLOOD PRESSURE   Omega-3 Fatty Acids (FISH OIL PO) Take 1,200 mg by mouth 2 (two) times daily.   Oyster Shell Calcium 500 MG TABS    pantoprazole (PROTONIX) 40 MG tablet TAKE 1 TABLET BY MOUTH EVERY DAY   valsartan-hydrochlorothiazide (DIOVAN-HCT) 80-12.5 MG tablet TAKE 1 TABLET BY MOUTH EVERY DAY FOR BLOOD PRESSURE   No facility-administered encounter medications on file as of 01/31/2022.    Allergies (verified) Livalo [pitavastatin] and Statins   History: Past Medical History:  Diagnosis Date  Essential hypertension    Fibrocystic breast    Hyperlipidemia    IBS (irritable bowel syndrome)    Idiopathic hematuria    OSA (obstructive sleep apnea)    Past Surgical History:  Procedure Laterality Date   COLONOSCOPY WITH PROPOFOL N/A 09/27/2019   Procedure: COLONOSCOPY WITH PROPOFOL;  Surgeon: Lin Landsman, MD;  Location: [facility] ENDOSCOPY;  Service: Gastroenterology;  Laterality: N/A;   ESOPHAGOGASTRODUODENOSCOPY (EGD) WITH PROPOFOL N/A 09/27/2019   Procedure: ESOPHAGOGASTRODUODENOSCOPY (EGD) WITH  PROPOFOL;  Surgeon: Lin Landsman, MD;  Location: [facility] ENDOSCOPY;  Service: Gastroenterology;  Laterality: N/A;   TONSILLECTOMY AND ADENOIDECTOMY  1961   Family History  Problem Relation Age of Onset   Hypertension Mother    Hyperlipidemia Mother    Alzheimer's disease Mother    Heart disease Father    Heart attack Father    Hyperlipidemia Father    Hypertension Father    Breast cancer Neg Hx    Social History   Socioeconomic History   Marital status: Married    Spouse name: Not on file   Number of children: Not on file   Years of education: Not on file   Highest education level: Not on file  Occupational History   Occupation: retired  Tobacco Use   Smoking status: Never   Smokeless tobacco: Never  Vaping Use   Vaping Use: Never used  Substance and Sexual Activity   Alcohol use: No   Drug use: No   Sexual activity: Yes  Other Topics Concern   Not on file  Social History Narrative   Married.   3 children.   Retired. Once worked a Agricultural engineer.   Enjoys spending time with family.   Social Determinants of Health   Financial Resource Strain: Low Risk  (01/31/2022)   Overall Financial Resource Strain (CARDIA)    Difficulty of Paying Living Expenses: Not hard at all  Food Insecurity: No Food Insecurity (01/31/2022)   Hunger Vital Sign    Worried About Running Out of Food in the Last Year: Never true    Ran Out of Food in the Last Year: Never true  Transportation Needs: No Transportation Needs (01/31/2022)   PRAPARE - Hydrologist (Medical): No    Lack of Transportation (Non-Medical): No  Physical Activity: Inactive (01/31/2022)   Exercise Vital Sign    Days of Exercise per Week: 0 days    Minutes of Exercise per Session: 0 min  Stress: No Stress Concern Present (01/31/2022)   Wright    Feeling of Stress : Not at all  Social Connections: Not on file    Tobacco  Counseling Counseling given: Not Answered   Clinical Intake:  Pre-visit preparation completed: Yes  Pain : No/denies pain     Nutritional Status: BMI > 30  Obese Nutritional Risks: None Diabetes: No  How often do you need to have someone help you when you read instructions, pamphlets, or other written materials from your doctor or pharmacy?: 1 - Never What is the last grade level you completed in school?: 52yrcollege  Diabetic? no  Interpreter Needed?: No  Information entered by :: NAllen LPN   Activities of Daily Living    01/31/2022    9:46 AM  In your present state of health, do you have any difficulty performing the following activities:  Hearing? 0  Vision? 0  Difficulty concentrating or making decisions? 0  Walking or climbing stairs?  1  Dressing or bathing? 0  Doing errands, shopping? 0  Preparing Food and eating ? N  Using the Toilet? N  In the past six months, have you accidently leaked urine? N  Do you have problems with loss of bowel control? N  Managing your Medications? N  Managing your Finances? N  Housekeeping or managing your Housekeeping? N    Patient Care Team: [name], NP as PCP - General (Internal Medicine) Opthamology, [facility] (Ophthalmology)  Indicate any recent Medical Services you may have received from other than [facility] providers in the past year (date may be approximate).     Assessment:   This is a routine wellness examination for [place].  Hearing/Vision screen Vision Screening - Comments:: Regular eye exams, [facility], Dr. Valetta Close  Dietary issues and exercise activities discussed: Current Exercise Habits: The patient does not participate in regular exercise at present   Goals Addressed             This Visit's Progress    Patient Stated       01/31/2022, no goals       Depression Screen    01/31/2022    9:46 AM 08/21/2021    8:08 AM 01/30/2021   10:29 AM 01/23/2021   11:02 AM 01/28/2020   10:30 AM  01/25/2019   11:10 AM 09/22/2017    8:24 AM  PHQ 2/9 Scores  PHQ - 2 Score 0 0 0 0 0 0 0  PHQ- 9 Score  0 0 1 0 0 0    Fall Risk    01/31/2022    9:46 AM 01/30/2021   10:29 AM 01/28/2020   10:29 AM 06/02/2019   11:54 AM 01/25/2019   11:10 AM  Fall Risk   Falls in the past year? 0 0 0 0 0  Comment    Emmi Telephone Survey: data to providers prior to load   Number falls in past yr: 0 0 0    Injury with Fall? 0 0 0    Risk for fall due to : Medication side effect No Fall Risks Medication side effect  Medication side effect  Follow up Falls evaluation completed;Education provided;Falls prevention discussed Falls evaluation completed;Falls prevention discussed Falls evaluation completed;Falls prevention discussed  Falls evaluation completed;Falls prevention discussed    FALL RISK PREVENTION PERTAINING TO THE HOME:  Any stairs in or around the home? Yes  If so, are there any without handrails? N/a Home free of loose throw rugs in walkways, pet beds, electrical cords, etc? Yes  Adequate lighting in your home to reduce risk of falls? Yes   ASSISTIVE DEVICES UTILIZED TO PREVENT FALLS:  Life alert? No  Use of a cane, walker or w/c? No  Grab bars in the bathroom? Yes  Shower chair or bench in shower? No  Elevated toilet seat or a handicapped toilet? No   TIMED UP AND GO:  Was the test performed? No .      Cognitive Function:    01/30/2021   10:32 AM 01/28/2020   10:31 AM 01/25/2019   11:14 AM 09/22/2017    8:15 AM  MMSE - Mini Mental State Exam  Orientation to time '5 5 5 5  '$ Orientation to Place '5 5 5 5  '$ Registration '3 3 3 3  '$ Attention/ Calculation '5 5 5 '$ 0  Recall '3 3 3 3  '$ Language- name 2 objects   0 0  Language- repeat '1 1 1 1  '$ Language- follow 3 step command  0 3  Language- read & follow direction   0 0  Write a sentence   0 0  Copy design   0 0  Total score   22 20        01/31/2022    9:48 AM  6CIT Screen  What Year? 0 points  What month? 0 points  What time?  0 points  Count back from 20 0 points  Months in reverse 0 points  Repeat phrase 2 points  Total Score 2 points    Immunizations Immunization History  Administered Date(s) Administered   Fluad Quad(high Dose 65+) 05/25/2020   Influenza, High Dose Seasonal PF 04/08/2018, 04/02/2019   Influenza-Unspecified 05/03/2021, 05/03/2021   PFIZER(Purple Top)SARS-COV-2 Vaccination 09/19/2019, 10/11/2019   Pneumococcal Conjugate-13 04/30/2016   Pneumococcal Polysaccharide-23 10/02/2017   Tdap 03/17/2007    TDAP status: Due, Education has been provided regarding the importance of this vaccine. Advised may receive this vaccine at local pharmacy or Health Dept. Aware to provide a copy of the vaccination record if obtained from local pharmacy or Health Dept. Verbalized acceptance and understanding.  Flu Vaccine status: Up to date  Pneumococcal vaccine status: Up to date  Covid-19 vaccine status: Completed vaccines  Qualifies for Shingles Vaccine? Yes   Zostavax completed No   Shingrix Completed?: No.    Education has been provided regarding the importance of this vaccine. Patient has been advised to call insurance company to determine out of pocket expense if they have not yet received this vaccine. Advised may also receive vaccine at local pharmacy or Health Dept. Verbalized acceptance and understanding.  Screening Tests Health Maintenance  Topic Date Due   Zoster Vaccines- Shingrix (1 of 2) Never done   COVID-19 Vaccine (3 - Pfizer risk series) 11/08/2019   TETANUS/TDAP  01/28/2024 (Originally 03/16/2017)   INFLUENZA VACCINE  02/05/2022   MAMMOGRAM  05/24/2022   COLONOSCOPY (Pts 45-54yr Insurance coverage will need to be confirmed)  09/26/2029   Pneumonia Vaccine 70 Years old  Completed   DEXA SCAN  Completed   Hepatitis C Screening  Completed   HPV VACCINES  Aged Out    Health Maintenance  Health Maintenance Due  Topic Date Due   Zoster Vaccines- Shingrix (1 of 2) Never done    COVID-19 Vaccine (3 - Pfizer risk series) 11/08/2019    Colorectal cancer screening: Type of screening: Colonoscopy. Completed 09/27/2019. Repeat every 10 years  Mammogram status: Completed 05/24/2020. Repeat every year  Bone Density status: Completed 05/24/2020.   Lung Cancer Screening: (Low Dose CT Chest recommended if Age 70-80years, 30 pack-year currently smoking OR have quit w/in 15years.) does not qualify.   Lung Cancer Screening Referral: no  Additional Screening:  Hepatitis C Screening: does qualify; Completed 12/17/2017  Vision Screening: Recommended annual ophthalmology exams for early detection of glaucoma and other disorders of the eye. Is the patient up to date with their annual eye exam?  Yes  Who is the provider or what is the name of the office in which the patient attends annual eye exams? Dr. BValetta CloseIf pt is not established with a provider, would they like to be referred to a provider to establish care? No .   Dental Screening: Recommended annual dental exams for proper oral hygiene  Community Resource Referral / Chronic Care Management: CRR required this visit?  No   CCM required this visit?  No      Plan:     I have personally reviewed and noted the  following in the patient's chart:   Medical and social history Use of alcohol, tobacco or illicit drugs  Current medications and supplements including opioid prescriptions.  Functional ability and status Nutritional status Physical activity Advanced directives List of other physicians Hospitalizations, surgeries, and ER visits in previous 12 months Vitals Screenings to include cognitive, depression, and falls Referrals and appointments  In addition, I have reviewed and discussed with patient certain preventive protocols, quality metrics, and best practice recommendations. A written personalized care plan for preventive services as well as general preventive health recommendations were provided to  patient.     Kellie Simmering, LPN   7/58/[id number]   Nurse Notes: none  Due to this being a virtual visit, the after visit summary with patients personalized plan was offered to patient via mail or my-chart. Patient would like to access on my-chart

## 2022-01-31 NOTE — Patient Instructions (Signed)
Amy Wilcox , Thank you for taking time to come for your Medicare Wellness Visit. I appreciate your ongoing commitment to your health goals. Please review the following plan we discussed and let me know if I can assist you in the future.   Screening recommendations/referrals: Colonoscopy: completed 09/27/2019, due 09/26/2029 Mammogram: completed 05/24/2020 Bone Density: completed 05/24/2020 Recommended yearly ophthalmology/optometry visit for glaucoma screening and checkup Recommended yearly dental visit for hygiene and checkup  Vaccinations: Influenza vaccine: due 02/05/2022 Pneumococcal vaccine: completed 10/02/2017 Tdap vaccine: due Shingles vaccine: discussed   Covid-19: 10/11/2019, 09/19/2019  Advanced directives: Please bring a copy of your POA (Power of Attorney) and/or Living Will to your next appointment.   Conditions/risks identified: none  Next appointment: Follow up in one year for your annual wellness visit    Preventive Care 65 Years and Older, Female Preventive care refers to lifestyle choices and visits with your health care provider that can promote health and wellness. What does preventive care include? A yearly physical exam. This is also called an annual well check. Dental exams once or twice a year. Routine eye exams. Ask your health care provider how often you should have your eyes checked. Personal lifestyle choices, including: Daily care of your teeth and gums. Regular physical activity. Eating a healthy diet. Avoiding tobacco and drug use. Limiting alcohol use. Practicing safe sex. Taking low-dose aspirin every day. Taking vitamin and mineral supplements as recommended by your health care provider. What happens during an annual well check? The services and screenings done by your health care provider during your annual well check will depend on your age, overall health, lifestyle risk factors, and family history of disease. Counseling  Your health care provider  may ask you questions about your: Alcohol use. Tobacco use. Drug use. Emotional well-being. Home and relationship well-being. Sexual activity. Eating habits. History of falls. Memory and ability to understand (cognition). Work and work Statistician. Reproductive health. Screening  You may have the following tests or measurements: Height, weight, and BMI. Blood pressure. Lipid and cholesterol levels. These may be checked every 5 years, or more frequently if you are over 55 years old. Skin check. Lung cancer screening. You may have this screening every year starting at age 70 if you have a 30-pack-year history of smoking and currently smoke or have quit within the past 15 years. Fecal occult blood test (FOBT) of the stool. You may have this test every year starting at age 45. Flexible sigmoidoscopy or colonoscopy. You may have a sigmoidoscopy every 5 years or a colonoscopy every 10 years starting at age 55. Hepatitis C blood test. Hepatitis B blood test. Sexually transmitted disease (STD) testing. Diabetes screening. This is done by checking your blood sugar (glucose) after you have not eaten for a while (fasting). You may have this done every 1-3 years. Bone density scan. This is done to screen for osteoporosis. You may have this done starting at age 53. Mammogram. This may be done every 1-2 years. Talk to your health care provider about how often you should have regular mammograms. Talk with your health care provider about your test results, treatment options, and if necessary, the need for more tests. Vaccines  Your health care provider may recommend certain vaccines, such as: Influenza vaccine. This is recommended every year. Tetanus, diphtheria, and acellular pertussis (Tdap, Td) vaccine. You may need a Td booster every 10 years. Zoster vaccine. You may need this after age 6. Pneumococcal 13-valent conjugate (PCV13) vaccine. One dose is recommended  after age 70. Pneumococcal  polysaccharide (PPSV23) vaccine. One dose is recommended after age 70. Talk to your health care provider about which screenings and vaccines you need and how often you need them. This information is not intended to replace advice given to you by your health care provider. Make sure you discuss any questions you have with your health care provider. Document Released: 07/21/2015 Document Revised: 03/13/2016 Document Reviewed: 04/25/2015 Elsevier Interactive Patient Education  2017 [place] Prevention in the Home Falls can cause injuries. They can happen to people of all ages. There are many things you can do to make your home safe and to help prevent falls. What can I do on the outside of my home? Regularly fix the edges of walkways and driveways and fix any cracks. Remove anything that might make you trip as you walk through a door, such as a raised step or threshold. Trim any bushes or trees on the path to your home. Use bright outdoor lighting. Clear any walking paths of anything that might make someone trip, such as rocks or tools. Regularly check to see if handrails are loose or broken. Make sure that both sides of any steps have handrails. Any raised decks and porches should have guardrails on the edges. Have any leaves, snow, or ice cleared regularly. Use sand or salt on walking paths during winter. Clean up any spills in your garage right away. This includes oil or grease spills. What can I do in the bathroom? Use night lights. Install grab bars by the toilet and in the tub and shower. Do not use towel bars as grab bars. Use non-skid mats or decals in the tub or shower. If you need to sit down in the shower, use a plastic, non-slip stool. Keep the floor dry. Clean up any water that spills on the floor as soon as it happens. Remove soap buildup in the tub or shower regularly. Attach bath mats securely with double-sided non-slip rug tape. Do not have throw rugs and other  things on the floor that can make you trip. What can I do in the bedroom? Use night lights. Make sure that you have a light by your bed that is easy to reach. Do not use any sheets or blankets that are too big for your bed. They should not hang down onto the floor. Have a firm chair that has side arms. You can use this for support while you get dressed. Do not have throw rugs and other things on the floor that can make you trip. What can I do in the kitchen? Clean up any spills right away. Avoid walking on wet floors. Keep items that you use a lot in easy-to-reach places. If you need to reach something above you, use a strong step stool that has a grab bar. Keep electrical cords out of the way. Do not use floor polish or wax that makes floors slippery. If you must use wax, use non-skid floor wax. Do not have throw rugs and other things on the floor that can make you trip. What can I do with my stairs? Do not leave any items on the stairs. Make sure that there are handrails on both sides of the stairs and use them. Fix handrails that are broken or loose. Make sure that handrails are as long as the stairways. Check any carpeting to make sure that it is firmly attached to the stairs. Fix any carpet that is loose or worn. Avoid having throw  rugs at the top or bottom of the stairs. If you do have throw rugs, attach them to the floor with carpet tape. Make sure that you have a light switch at the top of the stairs and the bottom of the stairs. If you do not have them, ask someone to add them for you. What else can I do to help prevent falls? Wear shoes that: Do not have high heels. Have rubber bottoms. Are comfortable and fit you well. Are closed at the toe. Do not wear sandals. If you use a stepladder: Make sure that it is fully opened. Do not climb a closed stepladder. Make sure that both sides of the stepladder are locked into place. Ask someone to hold it for you, if possible. Clearly  mark and make sure that you can see: Any grab bars or handrails. First and last steps. Where the edge of each step is. Use tools that help you move around (mobility aids) if they are needed. These include: Canes. Walkers. Scooters. Crutches. Turn on the lights when you go into a dark area. Replace any light bulbs as soon as they burn out. Set up your furniture so you have a clear path. Avoid moving your furniture around. If any of your floors are uneven, fix them. If there are any pets around you, be aware of where they are. Review your medicines with your doctor. Some medicines can make you feel dizzy. This can increase your chance of falling. Ask your doctor what other things that you can do to help prevent falls. This information is not intended to replace advice given to you by your health care provider. Make sure you discuss any questions you have with your health care provider. Document Released: 04/20/2009 Document Revised: 11/30/2015 Document Reviewed: 07/29/2014 Elsevier Interactive Patient Education  2017 [employer].

## 2022-02-19 ENCOUNTER — Ambulatory Visit (INDEPENDENT_AMBULATORY_CARE_PROVIDER_SITE_OTHER)
Admission: RE | Admit: 2022-02-19 | Discharge: 2022-02-19 | Disposition: A | Discharge status: Home / Self Care | Payer: Medicare Other | Source: Ambulatory Visit | Attending: Primary Care | Admitting: Primary Care

## 2022-02-19 ENCOUNTER — Encounter: Payer: Self-pay | Admitting: Primary Care

## 2022-02-19 ENCOUNTER — Ambulatory Visit (INDEPENDENT_AMBULATORY_CARE_PROVIDER_SITE_OTHER): Payer: Medicare Other | Admitting: Primary Care

## 2022-02-19 VITALS — BP 130/80 | HR 80 | Temp 97.4°F | Ht 62.0 in | Wt 167.0 lb

## 2022-02-19 DIAGNOSIS — M47816 Spondylosis without myelopathy or radiculopathy, lumbar region: Secondary | ICD-10-CM | POA: Diagnosis not present

## 2022-02-19 DIAGNOSIS — M25551 Pain in right hip: Secondary | ICD-10-CM | POA: Diagnosis not present

## 2022-02-19 DIAGNOSIS — R102 Pelvic and perineal pain: Secondary | ICD-10-CM | POA: Diagnosis not present

## 2022-02-19 DIAGNOSIS — Z1231 Encounter for screening mammogram for malignant neoplasm of breast: Secondary | ICD-10-CM | POA: Diagnosis not present

## 2022-02-19 DIAGNOSIS — M545 Low back pain, unspecified: Secondary | ICD-10-CM

## 2022-02-19 DIAGNOSIS — G8929 Other chronic pain: Secondary | ICD-10-CM | POA: Diagnosis not present

## 2022-02-19 DIAGNOSIS — M25561 Pain in right knee: Secondary | ICD-10-CM | POA: Diagnosis not present

## 2022-02-19 DIAGNOSIS — E2839 Other primary ovarian failure: Secondary | ICD-10-CM

## 2022-02-19 DIAGNOSIS — M1711 Unilateral primary osteoarthritis, right knee: Secondary | ICD-10-CM | POA: Diagnosis not present

## 2022-02-19 NOTE — Patient Instructions (Signed)
Stop by the xray prior to leaving today. I will notify you of your results once received.   You will be contacted regarding your referral to physical therapy and also for the ultrasound.  Please let us know if you have not been contacted within two weeks.   [facility] your mammogram and bone density scan.   It was a pleasure to see you today!

## 2022-02-19 NOTE — Assessment & Plan Note (Signed)
Checking plain films today. Referral placed to physical therapy.

## 2022-02-19 NOTE — Progress Notes (Signed)
Subjective:    Patient ID: Amy Wilcox, female    DOB: 1951-06-01, 71 y.o.   MRN: [id number]  HPI  Amy Wilcox is a very pleasant 71 y.o. female with a history of prediabetes, IBS, OSA, hyperlipidemia, hypertension, Non-alcoholic fatty liver who presents today to discuss several concerns. She is due for mammogram and bone density scan in November 2023.  1) Chronic Hip/Knee Pain: Chronic right hip pain for several years. Underwent xray of the right hip in 2020 which was negative. She was referred to orthopedics and was advised to complete home exercises. She did not complete these exercises.   Since then she's continued to experience right hip pain, but her hip will now "lock up" with prolonged periods of walking and sitting. She cannot cross her right leg over to her left leg. She's also noticed that she cannot sit in certain positions. Her pain is now radiating across her lower back around to her left hip. Occasionally her pain will radiate down her the right ankle.   Evaluated by orthopedics >5 years ago for chronic right knee pain. At the time she lived in a home with numerous stairs. Completed an injection of Synvisc to the right knee, experienced moderate swelling. A few years later she underwent injections of Synvisc to bilateral knees, experienced bilateral knee swelling. She underwent steroid injections years ago which were not beneficial. Since then she's moved to [facility] and does not live in a home with stairs. Her pain continues to be bothersome.   2) Pelvic Fullness/Pressure: Chronic for years, intermittent historically. Over the last year she's noticed this pressure/fullness daily. She mostly notices her fullness at night when laying down. She denies pain, fevers, chills, changes in IBS symptoms. Overall her IBS symptoms has improved since she was treated for recurrent c-diff. No changes in appetite, nausea, vomiting.   She has never undergone hysterectomy. Delivered several children  vaginally. She denies vaginal bleeding.  Review of Systems  Gastrointestinal:  Negative for abdominal pain, constipation, diarrhea and nausea.  Genitourinary:  Positive for pelvic pain. Negative for dysuria and vaginal discharge.  Musculoskeletal:  Positive for arthralgias.         Past Medical History:  Diagnosis Date   Essential hypertension    Fibrocystic breast    Hyperlipidemia    IBS (irritable bowel syndrome)    Idiopathic hematuria    OSA (obstructive sleep apnea)     Social History   Socioeconomic History   Marital status: Married    Spouse name: Not on file   Number of children: Not on file   Years of education: Not on file   Highest education level: Not on file  Occupational History   Occupation: retired  Tobacco Use   Smoking status: Never   Smokeless tobacco: Never  Vaping Use   Vaping Use: Never used  Substance and Sexual Activity   Alcohol use: No   Drug use: No   Sexual activity: Yes  Other Topics Concern   Not on file  Social History Narrative   Married.   3 children.   Retired. Once worked a Agricultural engineer.   Enjoys spending time with family.   Social Determinants of Health   Financial Resource Strain: Low Risk  (01/31/2022)   Overall Financial Resource Strain (CARDIA)    Difficulty of Paying Living Expenses: Not hard at all  Food Insecurity: No Food Insecurity (01/31/2022)   Hunger Vital Sign    Worried About Running Out of Food in the Last Year:  Never true    Ran Out of Food in the Last Year: Never true  Transportation Needs: No Transportation Needs (01/31/2022)   PRAPARE - Hydrologist (Medical): No    Lack of Transportation (Non-Medical): No  Physical Activity: Inactive (01/31/2022)   Exercise Vital Sign    Days of Exercise per Week: 0 days    Minutes of Exercise per Session: 0 min  Stress: No Stress Concern Present (01/31/2022)   [place]     Feeling of Stress : Not at all  Social Connections: Not on file  Intimate Partner Violence: Not At Risk (01/30/2021)   Humiliation, Afraid, Rape, and Kick questionnaire    Fear of Current or Ex-Partner: No    Emotionally Abused: No    Physically Abused: No    Sexually Abused: No    Past Surgical History:  Procedure Laterality Date   COLONOSCOPY WITH PROPOFOL N/A 09/27/2019   Procedure: COLONOSCOPY WITH PROPOFOL;  Surgeon: Lin Landsman, MD;  Location: [facility] ENDOSCOPY;  Service: Gastroenterology;  Laterality: N/A;   ESOPHAGOGASTRODUODENOSCOPY (EGD) WITH PROPOFOL N/A 09/27/2019   Procedure: ESOPHAGOGASTRODUODENOSCOPY (EGD) WITH PROPOFOL;  Surgeon: Lin Landsman, MD;  Location: [facility] ENDOSCOPY;  Service: Gastroenterology;  Laterality: N/A;   TONSILLECTOMY AND ADENOIDECTOMY  1961    Family History  Problem Relation Age of Onset   Hypertension Mother    Hyperlipidemia Mother    Alzheimer's disease Mother    Heart disease Father    Heart attack Father    Hyperlipidemia Father    Hypertension Father    Breast cancer Neg Hx     Allergies  Allergen Reactions   Livalo [Pitavastatin]    Statins     Current Outpatient Medications on File Prior to Visit  Medication Sig Dispense Refill   aspirin EC 81 MG tablet Take 81 mg by mouth daily.     Cholecalciferol (VITAMIN D3) 50 MCG (2000 UT) TABS 1 tablet     cyanocobalamin 500 MCG tablet Take 500 mcg by mouth daily.     ezetimibe (ZETIA) 10 MG tablet TAKE 1 TABLET BY MOUTH EVERY DAY FOR CHOLESTEROL 90 tablet 1   hydrOXYzine (ATARAX) 10 MG tablet TAKE 1 TO 2 TABLETS BY MOUTH TWICE A DAY AS NEEDED FOR ANXIETY 30 tablet 0   loperamide (IMODIUM) 2 MG capsule Taking 1/2 capsule by mouth at bedtime as needed     metoprolol succinate (TOPROL-XL) 50 MG 24 hr tablet TAKE 1 TABLET (50 MG TOTAL) BY MOUTH DAILY. FOR BLOOD PRESSURE 90 tablet 1   Omega-3 Fatty Acids (FISH OIL PO) Take 1,200 mg by mouth 2 (two) times daily.     Oyster Shell  Calcium 500 MG TABS      valsartan-hydrochlorothiazide (DIOVAN-HCT) 80-12.5 MG tablet TAKE 1 TABLET BY MOUTH EVERY DAY FOR BLOOD PRESSURE 90 tablet 3   No current facility-administered medications on file prior to visit.    BP 130/80 (BP Location: Left Arm, Patient Position: Sitting, Cuff Size: Normal)   Pulse 80   Temp (!) 97.4 F (36.3 C) (Temporal)   Ht '5\' 2"'$  (1.575 m)   Wt 167 lb (75.8 kg)   SpO2 98%   BMI 30.54 kg/m  Objective:   Physical Exam Cardiovascular:     Rate and Rhythm: Normal rate and regular rhythm.  Pulmonary:     Effort: Pulmonary effort is normal.     Breath sounds: Normal breath sounds.  Abdominal:  General: Bowel sounds are normal.     Palpations: Abdomen is soft.     Tenderness: There is no abdominal tenderness.       Comments: Fullness felt by patient to right pelvis   Musculoskeletal:     Cervical back: Neck supple.  Skin:    General: Skin is warm and dry.           Assessment & Plan:   Problem List Items Addressed This Visit       Other   Chronic pain of right hip - Primary    Checking plain films today. Referral placed to physical therapy.      Relevant Orders   Ambulatory referral to Physical Therapy   DG Hip Unilat W OR W/O Pelvis 2-3 Views Right   Chronic pain of right knee    Checking plain films today. Referral placed to physical therapy.      Relevant Orders   Ambulatory referral to Physical Therapy   DG Knee 3 Views Right   Chronic right-sided low back pain without sciatica    Checking plain films today. Referral placed to physical therapy.      Relevant Orders   DG Lumbar Spine 2-3 Views   Ambulatory referral to Physical Therapy   Pelvic pressure in female    Exam today benign.  Will order pelvic/transvaginal US for further evaluation.  Negative abdominal exam.      Relevant Orders   US PELVIC COMPLETE WITH TRANSVAGINAL   Other Visit Diagnoses     Estrogen deficiency       Relevant Orders   DG  Bone Density   Encounter for screening mammogram for malignant neoplasm of breast       Relevant Orders   MM 3D SCREEN BREAST BILATERAL          [name], NP

## 2022-02-19 NOTE — Assessment & Plan Note (Signed)
Exam today benign.  Will order pelvic/transvaginal US for further evaluation.  Negative abdominal exam.

## 2022-02-26 ENCOUNTER — Ambulatory Visit
Admission: RE | Admit: 2022-02-26 | Discharge: 2022-02-26 | Disposition: A | Discharge status: Home / Self Care | Payer: Medicare Other | Source: Ambulatory Visit | Attending: Primary Care | Admitting: Primary Care

## 2022-02-26 ENCOUNTER — Other Ambulatory Visit: Payer: Self-pay | Admitting: Primary Care

## 2022-02-26 DIAGNOSIS — R102 Pelvic and perineal pain: Secondary | ICD-10-CM | POA: Diagnosis not present

## 2022-03-13 ENCOUNTER — Other Ambulatory Visit (INDEPENDENT_AMBULATORY_CARE_PROVIDER_SITE_OTHER): Payer: Medicare Other

## 2022-03-13 DIAGNOSIS — R102 Pelvic and perineal pain: Secondary | ICD-10-CM | POA: Diagnosis not present

## 2022-03-13 LAB — POC URINALSYSI DIPSTICK (AUTOMATED)
Bilirubin, UA: NEGATIVE
Glucose, UA: NEGATIVE
Ketones, UA: NEGATIVE
Leukocytes, UA: NEGATIVE
Nitrite, UA: NEGATIVE
Protein, UA: NEGATIVE
Spec Grav, UA: 1.015 (ref 1.010–1.025)
Urobilinogen, UA: 0.2 E.U./dL
pH, UA: 5.5 (ref 5.0–8.0)

## 2022-03-14 LAB — URINE CULTURE
MICRO NUMBER:: 13879240
Result:: NO GROWTH
SPECIMEN QUALITY:: ADEQUATE

## 2022-03-15 ENCOUNTER — Other Ambulatory Visit: Payer: Self-pay | Admitting: Primary Care

## 2022-03-15 DIAGNOSIS — I10 Essential (primary) hypertension: Secondary | ICD-10-CM

## 2022-04-01 DIAGNOSIS — M5459 Other low back pain: Secondary | ICD-10-CM | POA: Diagnosis not present

## 2022-04-01 DIAGNOSIS — M25551 Pain in right hip: Secondary | ICD-10-CM | POA: Diagnosis not present

## 2022-04-01 DIAGNOSIS — M25561 Pain in right knee: Secondary | ICD-10-CM | POA: Diagnosis not present

## 2022-04-08 ENCOUNTER — Other Ambulatory Visit: Payer: Self-pay

## 2022-04-08 ENCOUNTER — Ambulatory Visit (INDEPENDENT_AMBULATORY_CARE_PROVIDER_SITE_OTHER): Payer: Medicare Other | Admitting: Gastroenterology

## 2022-04-08 ENCOUNTER — Encounter: Payer: Self-pay | Admitting: Gastroenterology

## 2022-04-08 VITALS — BP 135/80 | HR 63 | Temp 98.3°F | Ht 62.0 in | Wt 167.2 lb

## 2022-04-08 DIAGNOSIS — Z8619 Personal history of other infectious and parasitic diseases: Secondary | ICD-10-CM

## 2022-04-08 NOTE — Progress Notes (Signed)
Amy Darby, MD [address]  [place]  [place], [place] [address]  Main: [phone]  Fax: [phone]    Gastroenterology Consultation  Referring Provider:     [name], NP Primary Care Physician:  [name], NP Primary Gastroenterologist:  Dr. Cephas Wilcox Reason for Consultation: Recurrent C. difficile infection       HPI:   Amy Wilcox is a 71 y.o. Caucasian female with metabolic syndrome referred by Dr. Carlis Abbott, Leticia Penna, NP  for consultation & management of chronic diarrhea. She describes sporadic episodes of non bloody diarrhea, approximately 5/day a/w abdominal cramps. Stools are of variable consistency. Certain foods like raw onions, cream cheese, spicy foods, raw fruits and vegetables, a/w bloating. These occur approximately upto 3 times/month. Also, has constant upper abdominal discomfort, and epigastric burning, on pepcid as needed. Takes imodium daily at bedtime. The symptoms have been chronic and occurring for the past 10 years. She underwent colonoscopy in [place] in 2009 and was normal. She reports that her symptoms got worse and her last colonoscopy and would prefer not to undergo another colonoscopy. Random biopsies was not performed at that time. She does not recall if she was tested for celiac disease or H. pylori infection. She did not have EGD in the past. She is not losing weight. She denies nausea or vomiting, use of antibiotics, sick contacts or recent travel. She is retired and moved along with her husband from [place] about 10 years ago. She has elderly parents and has to travel frequently to [place] which she finds it quite stressful.  She reports that she is trying to eat healthy food.  She is also found to have mildly elevated transaminases since 02/2017. She also has hyperlipidemia and unable to tolerate statins. He is currently on ezetimibe. HCV antibody negative. She denies alcohol use or IV drug abuse  Follow-up visit  12/31/2017 Since last visit, patient underwent secondary liver disease workup which came back negative except for fatty liver and ultrasound elastography revealed moderate degree of fibrosis. She feels that her IBS symptoms have responded to VSL #3 but could not find it over-the-counter. her insurance did not approve amitriptyline. I switched to Zoloft and she took only one dose which caused headache. She is very anxious about her ultrasound results of the liver.she is here today to discuss about ultrasound results. Her husband is with her today. She wants to know if fatty liver and IBS are connected She is taking artificial sweeteners  Follow-up visit 04/03/2018 She reports that her IBS symptoms are manageable. She tried amitriptyline 50 mg at bedtime which resulted in drowsiness all day next day.  She also tried IBgard which did not provide any symptom relief.  She ordered VSL#3 online, she reports that she is watching what she is eating. She did not lose any weight since last visit.  She denies any other complaints, otherwise  Follow-up visit 09/06/2019 Patient continues to have ongoing loose bowel movements for which she takes half a pill of Imodium at bedtime daily.  She had 2 flareups in the last few months that have lasted for about 2 days.  She is interested to undergo colonoscopy for colon cancer screening.  She is also concerned about ongoing heartburn, epigastric discomfort.  She takes Pepcid as needed which does provide some relief.  Follow-up visit 10/20/2019 Patient requested an urgent follow-up visit due to onset of diarrhea right after the colonoscopy performed on 09/27/2019.  Patient reports  that 2 days later, she started experiencing nonbloody bowel movements, several times a day which would last for 3 days followed by no bowel movement for 1 to 2 days.  She has been very frustrated with diarrhea as she cannot go out or visit her father.  Her weight has been stable.  She has been taking  Imodium as needed.  She denies consuming carbonated beverages  Follow-up visit 05/23/2021 Patient is here for follow-up of IBS and fatty liver.  She reports that her IBS symptoms are manageable.  She reports having bouts of diarrhea every 3 to 4 days and not having any BM in between these episodes.  Her bowel movements initially started of hard, followed by loose and watery.  Patient does not have to take any stool softener.  She does report abdominal bloating.  Her weight has been stable.  She is trying to follow healthy diet.  Her liver enzymes are normal.  Follow-up visit 09/05/2021 Patient is here for follow-up of chronic intermittent diarrhea.  She had a flareup of diarrhea in last week of December 2023, stool studies came back positive for C. difficile, treated with 10 days course of oral vancomycin 125 mg.  Had diarrhea resolved for few days, unfortunately, has recurred, most of the days she has 3-4 loose bowel movements and has been taking Imodium almost on a daily basis.  This has significantly impaired her quality of life, cannot go out with her family.  Her son has special needs.  Her weight has been stable.  Follow-up visit 10/17/2021 Patient is here to undergo FMT in office today.  She had recurrent C. 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  First episode in 1/23.  She completed treatment for first recurrence, her diarrhea has resolved  Follow-up visit 04/08/2022 Patient underwent FMT in April 2023.  Since then, she noticed a significant improvement in her diarrhea.  She maintains a log of her bowel movements and since FMT, the frequency of her diarrheal episodes have significantly reduced.  In September, she had 5 sporadic episodes of loose bowel movements and she attributes it to the food that she ate.  She does not have any other concerns today.   NSAIDs: None  Antiplts/Anticoagulants/Anti thrombotics: None  GI Procedures: Colonoscopy in 2009 in [place], normal, biopsies  were not performed EGD and colonoscopy 09/27/2019 - Normal duodenal bulb and second portion of the duodenum. - Erosive gastropathy with stigmata of recent bleeding. Biopsied. - Esophagogastric landmarks identified. - Normal gastroesophageal junction and esophagus.  - The examined portion of the ileum was normal. - The entire examined colon is normal. - The distal rectum and anal verge are normal on retroflexion view. - No specimens collected.  DIAGNOSIS:  A. 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  - NEGATIVE FOR H. PYLORI BY IMMUNOHISTOCHEMISTRY (IHC).  - NEGATIVE FOR INTESTINAL METAPLASIA, ATROPHY, DYSPLASIA, AND  MALIGNANCY.   Comment:  There is a mild superficial lymphoplasmacytic inflammatory infiltrate,  more prominent in the antral samples.  No active inflammation is  identified, and IHC is negative for H. pylori bacteria.    She denies family history of GI malignancy, inflammatory bowel disease or celiac disease  Past Medical History:  Diagnosis Wilcox   Essential hypertension    Fibrocystic breast    Hyperlipidemia    IBS (irritable bowel syndrome)    Idiopathic hematuria    OSA (obstructive sleep apnea)     Past Surgical History:  Procedure  Laterality Wilcox   COLONOSCOPY WITH PROPOFOL N/A 09/27/2019   Procedure: COLONOSCOPY WITH PROPOFOL;  Surgeon: Amy Landsman, MD;  Location: [facility] [place] [facility] ENDOSCOPY;  Service: Gastroenterology;  Laterality: N/A;   ESOPHAGOGASTRODUODENOSCOPY (EGD) WITH PROPOFOL N/A 09/27/2019   Procedure: ESOPHAGOGASTRODUODENOSCOPY (EGD) WITH PROPOFOL;  Surgeon: Amy Landsman, MD;  Location: [facility] ENDOSCOPY;  Service: Gastroenterology;  Laterality: N/A;   TONSILLECTOMY AND ADENOIDECTOMY  1961    Current Outpatient Medications:    aspirin EC 81 MG tablet, Take 81 mg by mouth daily., Disp: , Rfl:    Cholecalciferol (VITAMIN D3) 50 MCG (2000 UT) TABS, 1 tablet, Disp: , Rfl:    cyanocobalamin 500 MCG  tablet, Take 500 mcg by mouth daily., Disp: , Rfl:    ezetimibe (ZETIA) 10 MG tablet, TAKE 1 TABLET BY MOUTH EVERY DAY FOR CHOLESTEROL, Disp: 90 tablet, Rfl: 1   hydrOXYzine (ATARAX) 10 MG tablet, TAKE 1 TO 2 TABLETS BY MOUTH TWICE A DAY AS NEEDED FOR ANXIETY, Disp: 30 tablet, Rfl: 0   loperamide (IMODIUM) 2 MG capsule, Taking 1/2 capsule by mouth at bedtime as needed, Disp: , Rfl:    metoprolol succinate (TOPROL-XL) 50 MG 24 hr tablet, TAKE 1 TABLET (50 MG TOTAL) BY MOUTH DAILY. FOR BLOOD PRESSURE, Disp: 90 tablet, Rfl: 1   Omega-3 Fatty Acids (FISH OIL PO), Take 1,200 mg by mouth 2 (two) times daily., Disp: , Rfl:    Oyster Shell Calcium 500 MG TABS, , Disp: , Rfl:    valsartan-hydrochlorothiazide (DIOVAN-HCT) 80-12.5 MG tablet, TAKE 1 TABLET BY MOUTH EVERY DAY FOR BLOOD PRESSURE, Disp: 90 tablet, Rfl: 1    Family History  Problem Relation Age of Onset   Hypertension Mother    Hyperlipidemia Mother    Alzheimer's disease Mother    Heart disease Father    Heart attack Father    Hyperlipidemia Father    Hypertension Father    Breast cancer Neg Hx      Social History   Tobacco Use   Smoking status: Never   Smokeless tobacco: Never  Vaping Use   Vaping Use: Never used  Substance Use Topics   Alcohol use: No   Drug use: No    Allergies as of 04/08/2022 - Review Complete 04/08/2022  Allergen Reaction Noted   Livalo [pitavastatin]  02/20/2017   Statins  02/20/2017    Review of Systems:    All systems reviewed and negative except where noted in HPI.   Physical Exam:  BP 135/80 (BP Location: Left Arm, Patient Position: Sitting, Cuff Size: Normal)   Pulse 63   Temp 98.3 F (36.8 C) (Oral)   Ht '5\' 2"'$  (1.575 m)   Wt 167 lb 4 oz (75.9 kg)   BMI 30.59 kg/m  No LMP recorded. Patient is postmenopausal.  General:   Alert,  Well-developed, well-nourished, pleasant and cooperative in NAD Head:  Normocephalic and atraumatic. Eyes:  Sclera clear, no icterus.   Conjunctiva  pink. Ears:  Normal auditory acuity. Nose:  No deformity, discharge, or lesions. Mouth:  No deformity or lesions,oropharynx pink & moist. Neck:  Supple; no masses or thyromegaly. Lungs:  Respirations even and unlabored.  Clear throughout to auscultation.   No wheezes, crackles, or rhonchi. No acute distress. Heart:  Regular rate and rhythm; no murmurs, clicks, rubs, or gallops. Abdomen:  Normal bowel sounds. Soft, non-tender and nondistended without masses, hepatosplenomegaly or hernias noted.  No guarding or rebound tenderness.   Rectal: Not performed Msk:  Symmetrical without gross deformities. Good,  equal movement & strength bilaterally. Pulses:  Normal pulses noted. Extremities:  No clubbing or edema.  No cyanosis. Neurologic:  Alert and oriented x3;  grossly normal neurologically. Skin:  Intact without significant lesions or rashes. No jaundice. Psych:  Alert and cooperative. Normal mood and affect.  Imaging Studies: No abdominal imaging available  Assessment and Plan:   Amy Wilcox is a 71 y.o.  White female with metabolic syndrome, Amy Wilcox fibrosis, chronic intermittent diarrhea, history of C. difficile infection in 1/23, norovirus in 8/21, 6/21, E. coli in 6/21, 4/21.  She is treated for C. difficile infection with 10 days course of oral vancomycin.  She had recurrence of diarrhea in 09/2021, s/p treatment with oral vancomycin.  S/p FMT through [facility] in early April 2023.  Diarrhea has resolved.   Follow up as needed   Amy Darby, MD

## 2022-04-30 DIAGNOSIS — M25551 Pain in right hip: Secondary | ICD-10-CM | POA: Diagnosis not present

## 2022-04-30 DIAGNOSIS — M5459 Other low back pain: Secondary | ICD-10-CM | POA: Diagnosis not present

## 2022-04-30 DIAGNOSIS — M25561 Pain in right knee: Secondary | ICD-10-CM | POA: Diagnosis not present

## 2022-05-02 ENCOUNTER — Encounter: Payer: Self-pay | Admitting: Dermatology

## 2022-05-02 ENCOUNTER — Ambulatory Visit (INDEPENDENT_AMBULATORY_CARE_PROVIDER_SITE_OTHER): Payer: Medicare Other | Admitting: Dermatology

## 2022-05-02 DIAGNOSIS — D2372 Other benign neoplasm of skin of left lower limb, including hip: Secondary | ICD-10-CM

## 2022-05-02 DIAGNOSIS — L821 Other seborrheic keratosis: Secondary | ICD-10-CM

## 2022-05-02 DIAGNOSIS — B079 Viral wart, unspecified: Secondary | ICD-10-CM

## 2022-05-02 DIAGNOSIS — Z1283 Encounter for screening for malignant neoplasm of skin: Secondary | ICD-10-CM | POA: Diagnosis not present

## 2022-05-02 DIAGNOSIS — D225 Melanocytic nevi of trunk: Secondary | ICD-10-CM | POA: Diagnosis not present

## 2022-05-02 DIAGNOSIS — L918 Other hypertrophic disorders of the skin: Secondary | ICD-10-CM | POA: Diagnosis not present

## 2022-05-02 DIAGNOSIS — L719 Rosacea, unspecified: Secondary | ICD-10-CM

## 2022-05-02 DIAGNOSIS — L578 Other skin changes due to chronic exposure to nonionizing radiation: Secondary | ICD-10-CM | POA: Diagnosis not present

## 2022-05-02 DIAGNOSIS — D229 Melanocytic nevi, unspecified: Secondary | ICD-10-CM | POA: Diagnosis not present

## 2022-05-02 DIAGNOSIS — D492 Neoplasm of unspecified behavior of bone, soft tissue, and skin: Secondary | ICD-10-CM | POA: Diagnosis not present

## 2022-05-02 DIAGNOSIS — L0100 Impetigo, unspecified: Secondary | ICD-10-CM | POA: Diagnosis not present

## 2022-05-02 DIAGNOSIS — L814 Other melanin hyperpigmentation: Secondary | ICD-10-CM | POA: Diagnosis not present

## 2022-05-02 DIAGNOSIS — D2362 Other benign neoplasm of skin of left upper limb, including shoulder: Secondary | ICD-10-CM | POA: Diagnosis not present

## 2022-05-02 MED ORDER — MUPIROCIN 2 % EX OINT: TOPICAL_OINTMENT | CUTANEOUS | 0 refills | Status: DC

## 2022-05-02 MED ORDER — BRIMONIDINE TARTRATE 0.33 % EX GEL: CUTANEOUS | 2 refills | Status: DC

## 2022-05-02 NOTE — Patient Instructions (Addendum)
Cryotherapy Aftercare  Wash gently with soap and water everyday.   Apply Vaseline and Band-Aid daily until healed.    Wound Care Instructions  Cleanse wound gently with soap and water once a day then pat dry with clean gauze. Apply a thin coat of Petrolatum (petroleum jelly, "Vaseline") over the wound (unless you have an allergy to this). We recommend that you use a new, sterile tube of Vaseline. Do not pick or remove scabs. Do not remove the yellow or white "healing tissue" from the base of the wound.  Cover the wound with fresh, clean, nonstick gauze and secure with paper tape. You may use Band-Aids in place of gauze and tape if the wound is small enough, but would recommend trimming much of the tape off as there is often too much. Sometimes Band-Aids can irritate the skin.  You should call the office for your biopsy report after 1 week if you have not already been contacted.  If you experience any problems, such as abnormal amounts of bleeding, swelling, significant bruising, significant pain, or evidence of infection, please call the office immediately.  FOR ADULT SURGERY PATIENTS: If you need something for pain relief you may take 1 extra strength Tylenol (acetaminophen) AND 2 Ibuprofen ('200mg'$  each) together every 4 hours as needed for pain. (do not take these if you are allergic to them or if you have a reason you should not take them.) Typically, you may only need pain medication for 1 to 3 days.   Melanoma ABCDEs  Melanoma is the most dangerous type of skin cancer, and is the leading cause of death from skin disease.  You are more likely to develop melanoma if you: Have light-colored skin, light-colored eyes, or red or blond hair Spend a lot of time in the sun Tan regularly, either outdoors or in a tanning bed Have had blistering sunburns, especially during childhood Have a close family member who has had a melanoma Have atypical moles or large birthmarks  Early detection of  melanoma is key since treatment is typically straightforward and cure rates are extremely high if we catch it early.   The first sign of melanoma is often a change in a mole or a new dark spot.  The ABCDE system is a way of remembering the signs of melanoma.  A for asymmetry:  The two halves do not match. B for border:  The edges of the growth are irregular. C for color:  A mixture of colors are present instead of an even brown color. D for diameter:  Melanomas are usually (but not always) greater than 4m - the size of a pencil eraser. E for evolution:  The spot keeps changing in size, shape, and color.  Please check your skin once per month between visits. You can use a small mirror in front and a large mirror behind you to keep an eye on the back side or your body.   If you see any new or changing lesions before your next follow-up, please call to schedule a visit.  Please continue daily skin protection including broad spectrum sunscreen SPF 30+ to sun-exposed areas, reapplying every 2 hours as needed when you're outdoors.    Due to recent changes in healthcare laws, you may see results of your pathology and/or laboratory studies on MyChart before the doctors have had a chance to review them. We understand that in some cases there may be results that are confusing or concerning to you. Please understand that not all  results are received at the same time and often the doctors may need to interpret multiple results in order to provide you with the best plan of care or course of treatment. Therefore, we ask that you please give [place] 2 business days to thoroughly review all your results before contacting the office for clarification. Should we see a critical lab result, you will be contacted sooner.   If You Need Anything After Your Visit  If you have any questions or concerns for your doctor, please call our main line at [phone] and press option 4 to reach your doctor's medical assistant. If  no one answers, please leave a voicemail as directed and we will return your call as soon as possible. Messages left after 4 pm will be answered the following business day.   You may also send [place] a message via [facility]. We typically respond to MyChart messages within 1-2 business days.  For prescription refills, please ask your pharmacy to contact our office. Our fax number is [phone].  If you have an urgent issue when the clinic is closed that cannot wait until the next business day, you can page your doctor at the number below.    Please note that while we do our best to be available for urgent issues outside of office hours, we are not available 24/7.   If you have an urgent issue and are unable to reach [place], you may choose to seek medical care at your doctor's office, retail clinic, urgent care center, or emergency room.  If you have a medical emergency, please immediately call 911 or go to the emergency department.  Pager Numbers  - Dr. Nehemiah Massed: [phone]  - Dr. Laurence Ferrari: [phone]  - Dr. Nicole Kindred: [phone]  In the event of inclement weather, please call our main line at [phone] for an update on the status of any delays or closures.  Dermatology Medication Tips: Please keep the boxes that topical medications come in in order to help keep track of the instructions about where and how to use these. Pharmacies typically print the medication instructions only on the boxes and not directly on the medication tubes.   If your medication is too expensive, please contact our office at [phone] option 4 or send [place] a message through [place].   We are unable to tell what your co-pay for medications will be in advance as this is different depending on your insurance coverage. However, we may be able to find a substitute medication at lower cost or fill out paperwork to get insurance to cover a needed medication.   If a prior authorization is required to get your medication  covered by your insurance company, please allow [place] 1-2 business days to complete this process.  Drug prices often vary depending on where the prescription is filled and some pharmacies may offer cheaper prices.  The website www.goodrx.com contains coupons for medications through different pharmacies. The prices here do not account for what the cost may be with help from insurance (it may be cheaper with your insurance), but the website can give you the price if you did not use any insurance.  - You can print the associated coupon and take it with your prescription to the pharmacy.  - You may also stop by our office during regular business hours and pick up a GoodRx coupon card.  - If you need your prescription sent electronically to a different pharmacy, notify our office through [facility] or by phone at [phone]  option 4.     Si Usted Necesita Algo Despus de Su Visita  Tambin puede enviarnos un mensaje a travs de Pharmacist, community. Por lo general respondemos a los mensajes de MyChart en el transcurso de 1 a 2 das hbiles.  Para renovar recetas, por favor pida a su farmacia que se ponga en contacto con nuestra oficina. Harland Dingwall de fax es [place] [phone].  Si tiene un asunto urgente cuando la clnica est cerrada y que no puede esperar hasta el siguiente da hbil, puede llamar/localizar a su doctor(a) al nmero que aparece a continuacin.   Por favor, tenga en cuenta que aunque hacemos todo lo posible para estar disponibles para asuntos urgentes fuera del horario de [place], no estamos disponibles las 24 horas del da, los 7 das de la [place].   Si tiene un problema urgente y no puede comunicarse con nosotros, puede optar por buscar atencin mdica  en el consultorio de su doctor(a), en una clnica privada, en un centro de atencin urgente o en una sala de emergencias.  Si tiene Engineering geologist, por favor llame inmediatamente al 911 o vaya a la sala de  emergencias.  Nmeros de bper  - Dr. Nehemiah Massed: [phone]  - Dra. Moye: [phone]  - Dra. Nicole Kindred: [phone]  En caso de inclemencias del [place], por favor llame a Johnsie Kindred principal al [phone] para una actualizacin sobre el [place] de cualquier retraso o cierre.  Consejos para la medicacin en dermatologa: Por favor, guarde las cajas en las que vienen los medicamentos de uso tpico para ayudarle a seguir las instrucciones sobre dnde y cmo usarlos. Las farmacias generalmente imprimen las instrucciones del medicamento slo en las cajas y no directamente en los tubos del [place].   Si su medicamento es muy caro, por favor, pngase en contacto con Zigmund Daniel llamando al [phone] y presione la opcin 4 o envenos un mensaje a travs de Pharmacist, community.   No podemos decirle cul ser su copago por los medicamentos por adelantado ya que esto es diferente dependiendo de la cobertura de su seguro. Sin embargo, es posible que podamos encontrar un medicamento sustituto a Electrical engineer un formulario para que el seguro cubra el medicamento que se considera necesario.   Si se requiere una autorizacin previa para que su compaa de seguros [place] su medicamento, por favor permtanos de 1 a 2 das hbiles para completar este proceso.  Los precios de los medicamentos varan con frecuencia dependiendo del Environmental consultant de dnde se surte la receta y alguna farmacias pueden ofrecer precios ms baratos.  El sitio web www.goodrx.com tiene cupones para medicamentos de Airline pilot. Los precios aqu no tienen en cuenta lo que podra costar con la ayuda del seguro (puede ser ms barato con su seguro), pero el sitio web puede darle el precio si no utiliz Research scientist (physical sciences).  - Puede imprimir el cupn correspondiente y llevarlo con su receta a la farmacia.  - Tambin puede pasar por nuestra oficina durante el horario de atencin regular y Charity fundraiser una tarjeta de cupones de GoodRx.  - Si  necesita que su receta se enve electrnicamente a una farmacia diferente, informe a nuestra oficina a travs de MyChart de [place] o por telfono llamando al [phone] y presione la opcin 4.

## 2022-05-02 NOTE — Progress Notes (Signed)
Follow-Up Visit   Subjective  Amy Wilcox is a 71 y.o. female who presents for the following: Annual Exam (No hx of skin cancer, daughter with hx of MM) and Rosacea (Patient has used metronidazole in the past but it did not help and patient seemed to be more inflamed after using. ).  The patient presents for Total-Body Skin Exam (TBSE) for skin cancer screening and mole check.  The patient has spots, moles and lesions to be evaluated, some may be new or changing and the patient has concerns that these could be cancer.  Patient with a spot at left lower leg, present for a few months.   Family history of skin cancer - what type(s): melanoma - who affected: daughter   The following portions of the chart were reviewed this encounter and updated as appropriate:   Tobacco  Allergies  Meds  Problems  Med Hx  Surg Hx  Fam Hx      Review of Systems:  No other skin or systemic complaints except as noted in HPI or Assessment and Plan.  Objective  Well appearing patient in no apparent distress; mood and affect are within normal limits.  A full examination was performed including scalp, head, eyes, ears, nose, lips, neck, chest, axillae, abdomen, back, buttocks, bilateral upper extremities, bilateral lower extremities, hands, feet, fingers, toes, fingernails, and toenails. All findings within normal limits unless otherwise noted below.  Right Upper Back 0.3 cm medium to dark brown thin papule. Fried egg pattern.   left pretibia 0.3 cm firm tan papule R/o irritated DF vs other       Right Upper Back Verrucous papules -- Discussed viral etiology and contagion.   left earlobe Crusted erosion  face Mid face erythema with scattered inflammatory papules  Neck - Anterior Erythematous fleshy, skin-colored pedunculated papules.      Assessment & Plan  Nevus Right Upper Back  Stable Benign-appearing.  Recommend observation for change.  Call clinic for appointment for  evaluation of any new or changing lesions.    Neoplasm of skin left pretibia  Epidermal / dermal shaving  Lesion diameter (cm):  0.3 Informed consent: discussed and consent obtained   Timeout: patient name, date of birth, surgical site, and procedure verified   Anesthesia: the lesion was anesthetized in a standard fashion   Anesthetic:  1% lidocaine w/ epinephrine 1-100,000 local infiltration Instrument used: flexible razor blade   Hemostasis achieved with: aluminum chloride   Outcome: patient tolerated procedure well   Post-procedure details: wound care instructions given   Additional details:  Mupirocin and a bandage applied  Specimen 1 - Surgical pathology Differential Diagnosis: R/o irritated DF vs other  Check Margins: No 0.3 cm firm tan papule   Viral warts, unspecified type Right Upper Back  Discussed viral etiology and risk of spread.  Discussed multiple treatments may be required to clear warts.  Discussed possible post-treatment dyspigmentation and risk of recurrence.  Prior to procedure, discussed risks of blister formation, small wound, skin dyspigmentation, or rare scar following cryotherapy. Recommend Vaseline ointment to treated areas while healing.   Destruction of lesion - Right Upper Back  Destruction method: cryotherapy   Informed consent: discussed and consent obtained   Timeout:  patient name, date of birth, surgical site, and procedure verified Lesion destroyed using liquid nitrogen: Yes   Cryotherapy cycles:  2 Outcome: patient tolerated procedure well with no complications   Post-procedure details: wound care instructions given    Impetigo left earlobe  Start  mupirocin 2-3 times daily to affected area until clear.  mupirocin ointment (BACTROBAN) 2 % - left earlobe Apply to affected area 2-3 times daily  Rosacea face  Chronic and persistent condition with duration or expected duration over one year. Condition is bothersome/symptomatic for  patient. Currently flared.  Rosacea is a chronic progressive skin condition usually affecting the face of adults, causing redness and/or acne bumps. It is treatable but not curable. It sometimes affects the eyes (ocular rosacea) as well. It may respond to topical and/or systemic medication and can flare with stress, sun exposure, alcohol, exercise, topical steroids (including hydrocortisone/cortisone 10) and some foods.  Daily application of broad spectrum spf 30+ sunscreen to face is recommended to reduce flares.  Start Mirvaso once daily in the morning  (insurance preferred) If not effective or not covered, can resend Sales executive.  If still unable to get Soolantra, consider [facility] Ivermectin/Oxymetazoline   Brimonidine Tartrate (MIRVASO) 0.33 % GEL - face Apply once daily to face in the morning.  Skin tags, multiple acquired Neck - Anterior  Symptomatic per patient. Inflamed on exam.  Procedure: Skin tag removal Informed consent:  Discussed risks (permanent scarring, infection, pain, bleeding, bruising, redness, and recurrence of the lesion) and benefits of the procedure, as well as the alternatives.  Patient is aware that skin tags are benign lesions, and their removal is often not considered medically necessary.  Informed consent was obtained. The area was prepared with isopropyl alcohol. Anesthesia:  lidocaine 1% with epinephrine was injected to achieve good local anesthesia Snip removal was performed.   Antibiotic ointment and a sterile dressing were applied.   The patient tolerated procedure well. The patient was instructed on post-op care.   Number of lesions removed:  5 at neck     Lentigines - Scattered tan macules - Due to sun exposure - Benign-appearing, observe - Recommend daily broad spectrum sunscreen SPF 30+ to sun-exposed areas, reapply every 2 hours as needed. - Call for any changes  Seborrheic Keratoses - Stuck-on, waxy, tan-brown papules and/or plaques   - Benign-appearing - Discussed benign etiology and prognosis. - Observe - Call for any changes  Melanocytic Nevi - Tan-brown and/or pink-flesh-colored symmetric macules and papules - Benign appearing on exam today - Observation - Call clinic for new or changing moles - Recommend daily use of broad spectrum spf 30+ sunscreen to sun-exposed areas.   Hemangiomas - Red papules - Discussed benign nature - Observe - Call for any changes  Actinic Damage - Chronic condition, secondary to cumulative UV/sun exposure - diffuse scaly erythematous macules with underlying dyspigmentation - Recommend daily broad spectrum sunscreen SPF 30+ to sun-exposed areas, reapply every 2 hours as needed.  - Staying in the shade or wearing long sleeves, sun glasses (UVA+UVB protection) and wide brim hats (4-inch brim around the entire circumference of the hat) are also recommended for sun protection.  - Call for new or changing lesions.  Skin cancer screening performed today.  Dermatofibroma - Firm pink/brown papulenodule with dimple sign at left calf - Benign appearing - Call for any changes  Return in about 1 year (around 05/03/2023) for TBSE.  Graciella Belton, RMA, am acting as scribe for Forest Gleason, MD .  Documentation: I have reviewed the above documentation for accuracy and completeness, and I agree with the above.  Forest Gleason, MD

## 2022-05-03 DIAGNOSIS — M25561 Pain in right knee: Secondary | ICD-10-CM | POA: Diagnosis not present

## 2022-05-03 DIAGNOSIS — M25551 Pain in right hip: Secondary | ICD-10-CM | POA: Diagnosis not present

## 2022-05-03 DIAGNOSIS — M5459 Other low back pain: Secondary | ICD-10-CM | POA: Diagnosis not present

## 2022-05-06 ENCOUNTER — Encounter: Payer: Self-pay | Admitting: Dermatology

## 2022-05-06 DIAGNOSIS — M5459 Other low back pain: Secondary | ICD-10-CM | POA: Diagnosis not present

## 2022-05-06 DIAGNOSIS — M25561 Pain in right knee: Secondary | ICD-10-CM | POA: Diagnosis not present

## 2022-05-06 DIAGNOSIS — M25551 Pain in right hip: Secondary | ICD-10-CM | POA: Diagnosis not present

## 2022-05-06 MED ORDER — IVERMECTIN 1 % EX CREA: TOPICAL_CREAM | CUTANEOUS | 0 refills | Status: DC

## 2022-05-07 ENCOUNTER — Telehealth: Payer: Self-pay

## 2022-05-07 MED ORDER — IVERMECTIN 1 % EX CREA: TOPICAL_CREAM | CUTANEOUS | 2 refills | Status: DC

## 2022-05-07 NOTE — Telephone Encounter (Signed)
-----   Message from [place], MD sent at 05/06/2022 11:14 PM EDT ----- Skin , left pretibial DERMATOFIBROMA, BASE INVOLVED "scar ball", no sign of cancer or precancer, No treatment needed.  MAs please call. Thank you!

## 2022-05-07 NOTE — Telephone Encounter (Signed)
Discussed pathology results. Patient voiced understanding.  

## 2022-05-07 NOTE — Telephone Encounter (Signed)
Called pt discussed we will call in skin medicinals Ivermectin/Oxymetazoline  To skin medicinals

## 2022-05-07 NOTE — Telephone Encounter (Signed)
error 

## 2022-05-08 NOTE — Telephone Encounter (Signed)
I see you sent a prescription for soolantra. Was that supposed to go in or is she going to use the skin medicinals? Thank you!

## 2022-05-16 ENCOUNTER — Encounter: Payer: Self-pay | Admitting: Gastroenterology

## 2022-05-16 DIAGNOSIS — R197 Diarrhea, unspecified: Secondary | ICD-10-CM

## 2022-05-27 ENCOUNTER — Ambulatory Visit
Admission: RE | Admit: 2022-05-27 | Discharge: 2022-05-27 | Disposition: A | Discharge status: Home / Self Care | Payer: Medicare Other | Source: Ambulatory Visit | Attending: Primary Care | Admitting: Primary Care

## 2022-05-27 DIAGNOSIS — Z1231 Encounter for screening mammogram for malignant neoplasm of breast: Secondary | ICD-10-CM | POA: Insufficient documentation

## 2022-05-27 DIAGNOSIS — Z780 Asymptomatic menopausal state: Secondary | ICD-10-CM | POA: Diagnosis not present

## 2022-05-27 DIAGNOSIS — E2839 Other primary ovarian failure: Secondary | ICD-10-CM | POA: Diagnosis not present

## 2022-05-27 DIAGNOSIS — M85851 Other specified disorders of bone density and structure, right thigh: Secondary | ICD-10-CM | POA: Diagnosis not present

## 2022-06-15 ENCOUNTER — Other Ambulatory Visit: Payer: Self-pay | Admitting: Primary Care

## 2022-06-15 DIAGNOSIS — I10 Essential (primary) hypertension: Secondary | ICD-10-CM

## 2022-06-15 DIAGNOSIS — E785 Hyperlipidemia, unspecified: Secondary | ICD-10-CM

## 2022-06-16 NOTE — Telephone Encounter (Signed)
Patient is due for follow up in late February 2024. Please schedule.

## 2022-07-19 DIAGNOSIS — H04123 Dry eye syndrome of bilateral lacrimal glands: Secondary | ICD-10-CM | POA: Diagnosis not present

## 2022-07-19 DIAGNOSIS — H2513 Age-related nuclear cataract, bilateral: Secondary | ICD-10-CM | POA: Diagnosis not present

## 2022-07-19 DIAGNOSIS — H524 Presbyopia: Secondary | ICD-10-CM | POA: Diagnosis not present

## 2022-07-19 LAB — HM DIABETES EYE EXAM

## 2022-07-23 ENCOUNTER — Encounter: Payer: Self-pay | Admitting: Family

## 2022-07-24 NOTE — Telephone Encounter (Signed)
Thank you, I appreciate the update.  Would you like for me to respond to her?   (It looks like I worked up her knee concerns and haven't heard from her since).

## 2022-07-24 NOTE — Telephone Encounter (Signed)
Amy Wilcox I rather not accept her as a transfer of care however I wanted you to know that she had inquired.

## 2022-07-29 DIAGNOSIS — M13861 Other specified arthritis, right knee: Secondary | ICD-10-CM | POA: Diagnosis not present

## 2022-07-29 DIAGNOSIS — M13862 Other specified arthritis, left knee: Secondary | ICD-10-CM | POA: Diagnosis not present

## 2022-07-29 DIAGNOSIS — M25561 Pain in right knee: Secondary | ICD-10-CM | POA: Diagnosis not present

## 2022-07-29 DIAGNOSIS — M25562 Pain in left knee: Secondary | ICD-10-CM | POA: Diagnosis not present

## 2022-07-30 NOTE — Telephone Encounter (Signed)
Notified. 

## 2022-09-18 DIAGNOSIS — G4733 Obstructive sleep apnea (adult) (pediatric): Secondary | ICD-10-CM | POA: Diagnosis not present

## 2022-09-23 ENCOUNTER — Other Ambulatory Visit: Payer: Self-pay | Admitting: Primary Care

## 2022-09-23 DIAGNOSIS — F418 Other specified anxiety disorders: Secondary | ICD-10-CM

## 2022-09-23 MED ORDER — HYDROXYZINE HCL 10 MG PO TABS: 10.0000 mg | ORAL_TABLET | Freq: Two times a day (BID) | ORAL | 0 refills | Status: DC | PRN

## 2022-09-23 NOTE — Telephone Encounter (Signed)
From: [name] To: Office of [name], NP Sent: 09/23/2022 12:06 PM EDT Subject: Medication Renewal Request  Refills have been requested for the following medications:   hydrOXYzine (ATARAX) 10 MG tablet [Shelly Shoultz K Absalom Aro]  Patient Comment: Used last tablet today.  Preferred pharmacy: [id number] - [place], [place] Delivery method: Arlyss Gandy

## 2022-10-01 ENCOUNTER — Ambulatory Visit (INDEPENDENT_AMBULATORY_CARE_PROVIDER_SITE_OTHER): Payer: Medicare Other | Admitting: Primary Care

## 2022-10-01 ENCOUNTER — Encounter: Payer: Self-pay | Admitting: Primary Care

## 2022-10-01 VITALS — BP 132/72 | HR 63 | Temp 99.0°F | Ht 62.0 in | Wt 165.0 lb

## 2022-10-01 DIAGNOSIS — K760 Fatty (change of) liver, not elsewhere classified: Secondary | ICD-10-CM | POA: Diagnosis not present

## 2022-10-01 DIAGNOSIS — M25561 Pain in right knee: Secondary | ICD-10-CM | POA: Diagnosis not present

## 2022-10-01 DIAGNOSIS — F418 Other specified anxiety disorders: Secondary | ICD-10-CM

## 2022-10-01 DIAGNOSIS — R7303 Prediabetes: Secondary | ICD-10-CM | POA: Diagnosis not present

## 2022-10-01 DIAGNOSIS — G8929 Other chronic pain: Secondary | ICD-10-CM | POA: Diagnosis not present

## 2022-10-01 DIAGNOSIS — G4733 Obstructive sleep apnea (adult) (pediatric): Secondary | ICD-10-CM | POA: Diagnosis not present

## 2022-10-01 DIAGNOSIS — M545 Low back pain, unspecified: Secondary | ICD-10-CM

## 2022-10-01 DIAGNOSIS — I10 Essential (primary) hypertension: Secondary | ICD-10-CM | POA: Diagnosis not present

## 2022-10-01 DIAGNOSIS — E785 Hyperlipidemia, unspecified: Secondary | ICD-10-CM | POA: Diagnosis not present

## 2022-10-01 DIAGNOSIS — M25551 Pain in right hip: Secondary | ICD-10-CM

## 2022-10-01 LAB — LIPID PANEL
Cholesterol: 230 mg/dL — ABNORMAL HIGH (ref 0–200)
HDL: 43 mg/dL (ref >39.00)
NonHDL: 186.81
Total CHOL/HDL Ratio: 5
Triglycerides: 301 mg/dL — ABNORMAL HIGH (ref 0.0–149.0)
VLDL: 60.2 mg/dL — ABNORMAL HIGH (ref 0.0–40.0)

## 2022-10-01 LAB — COMPREHENSIVE METABOLIC PANEL
ALT: 24 U/L (ref 0–35)
AST: 19 U/L (ref 0–37)
Albumin: 4.4 g/dL (ref 3.5–5.2)
Alkaline Phosphatase: 32 U/L — ABNORMAL LOW (ref 39–117)
BUN: 19 mg/dL (ref 6–23)
CO2: 30 mEq/L (ref 19–32)
Calcium: 10.2 mg/dL (ref 8.4–10.5)
Chloride: 99 mEq/L (ref 96–112)
Creatinine, Ser: 0.97 mg/dL (ref 0.40–1.20)
GFR: 58.97 mL/min — ABNORMAL LOW (ref >60.00)
Glucose, Bld: 172 mg/dL — ABNORMAL HIGH (ref 70–99)
Potassium: 3.7 mEq/L (ref 3.5–5.1)
Sodium: 138 mEq/L (ref 135–145)
Total Bilirubin: 0.8 mg/dL (ref 0.2–1.2)
Total Protein: 7.2 g/dL (ref 6.0–8.3)

## 2022-10-01 LAB — HEMOGLOBIN A1C: Hgb A1c MFr Bld: 6.5 % (ref 4.6–6.5)

## 2022-10-01 LAB — LDL CHOLESTEROL, DIRECT: Direct LDL: 137 mg/dL

## 2022-10-01 MED ORDER — METOPROLOL SUCCINATE ER 25 MG PO TB24: 25.0000 mg | ORAL_TABLET | Freq: Every day | ORAL | 3 refills | Status: DC

## 2022-10-01 NOTE — Assessment & Plan Note (Signed)
Offered physical therapy vs orthopedic referral, she kindly declines but will update if she becomes interested.  Discussed home stretching and exercises.

## 2022-10-01 NOTE — Patient Instructions (Signed)
Stop by the lab prior to leaving today. I will notify you of your results once received.   We reduced your dose of metoprolol succinate to 25 mg daily. Continue to monitor your heart rate.  It was a pleasure to see you today!

## 2022-10-01 NOTE — Progress Notes (Signed)
Subjective:    Patient ID: Amy Wilcox, female    DOB: [birth year], 72 y.o.   MRN: [id number]  HPI  Amy Wilcox is a very pleasant 72 y.o. female with a history of OSA, hypertension, IBS, prediabetes, hyperlipidemia, chronic back/hip/knee pain, NAFLD, situational anxiety who presents today for follow up of chronic conditions and to discuss fatigue.   Immunizations: -Tetanus: Completed in 2008 -Pneumonia: Completed Prevnar 13 in 2017 and Pneumovax 23 in 2019   Colonoscopy: Completed in 2021, due 2031   1) Hyperlipidemia: Currently managed on Zetia 10 mg daily. She is due for repeat lipid panel today. History of multi statin drug intolerance.  2) Hypertension: Currently managed on metoprolol succinate 50 mg daily, valsartan-HCTZ 80-12.5 mg daily. She denies chest pain. She has noticed intermittent dizziness with heart rate readings in the 50's. She's been managed on metoprolol succinate for years and years. She did experience palpitations with anxiety in her 20's, does occasionally notice palpitations.   Also managed on CPAP nightly for sleep apnea. Overall tolerating well.    3) Situational Anxiety: Currently managed on hydroxyzine 10 mg for which she uses sparingly. Overall does well on this regimen.   4) Osteoarthritis: Chronic to the hips, lower back, and knees. Previously following with orthopedics years ago, underwent steroid injections which were ineffective. Also with allergy Synvisc. She completed physical therapy last year which was helpful, tries to complete home exercises but she finds it difficult to remain consistent.    BP Readings from Last 3 Encounters:  10/01/22 132/72  04/08/22 135/80  02/19/22 130/80       Review of Systems  Constitutional:  Positive for fatigue.  Respiratory:  Negative for shortness of breath.   Cardiovascular:  Negative for chest pain.  Musculoskeletal:  Positive for arthralgias and back pain.  Neurological:  Positive for dizziness. Negative  for headaches.  Psychiatric/Behavioral:         Intermittent anxiety          Past Medical History:  Diagnosis Date   Essential hypertension    Fibrocystic breast    Hyperlipidemia    IBS (irritable bowel syndrome)    Idiopathic hematuria    OSA (obstructive sleep apnea)     Social History   Socioeconomic History   Marital status: Married    Spouse name: Not on file   Number of children: Not on file   Years of education: Not on file   Highest education level: Some college, no degree  Occupational History   Occupation: retired  Tobacco Use   Smoking status: Never   Smokeless tobacco: Never  Vaping Use   Vaping Use: Never used  Substance and Sexual Activity   Alcohol use: No   Drug use: No   Sexual activity: Yes  Other Topics Concern   Not on file  Social History Narrative   Married.   3 children.   Retired. Once worked a Agricultural engineer.   Enjoys spending time with family.   Social Determinants of Health   Financial Resource Strain: Low Risk  (09/30/2022)   Overall Financial Resource Strain (CARDIA)    Difficulty of Paying Living Expenses: Not hard at all  Food Insecurity: No Food Insecurity (09/30/2022)   Hunger Vital Sign    Worried About Running Out of Food in the Last Year: Never true    Ran Out of Food in the Last Year: Never true  Transportation Needs: No Transportation Needs (09/30/2022)   PRAPARE - Transportation    Lack  of Transportation (Medical): No    Lack of Transportation (Non-Medical): No  Physical Activity: Inactive (09/30/2022)   Exercise Vital Sign    Days of Exercise per Week: 0 days    Minutes of Exercise per Session: 0 min  Stress: No Stress Concern Present (09/30/2022)   [place]    Feeling of Stress : Only a little  Social Connections: Socially Integrated (09/30/2022)   Social Connection and Isolation Panel [NHANES]    Frequency of Communication with Friends and Family:  More than three times a week    Frequency of Social Gatherings with Friends and Family: Three times a week    Attends Religious Services: More than 4 times per year    Active Member of Clubs or Organizations: Yes    Attends Archivist Meetings: More than 4 times per year    Marital Status: Married  Human resources officer Violence: Not At Risk (01/30/2021)   Humiliation, Afraid, Rape, and Kick questionnaire    Fear of Current or Ex-Partner: No    Emotionally Abused: No    Physically Abused: No    Sexually Abused: No    Past Surgical History:  Procedure Laterality Date   COLONOSCOPY WITH PROPOFOL N/A 09/27/2019   Procedure: COLONOSCOPY WITH PROPOFOL;  Surgeon: Lin Landsman, MD;  Location: [facility] ENDOSCOPY;  Service: Gastroenterology;  Laterality: N/A;   ESOPHAGOGASTRODUODENOSCOPY (EGD) WITH PROPOFOL N/A 09/27/2019   Procedure: ESOPHAGOGASTRODUODENOSCOPY (EGD) WITH PROPOFOL;  Surgeon: Lin Landsman, MD;  Location: [facility] ENDOSCOPY;  Service: Gastroenterology;  Laterality: N/A;   TONSILLECTOMY AND ADENOIDECTOMY  1961    Family History  Problem Relation Age of Onset   Hypertension Mother    Hyperlipidemia Mother    Alzheimer's disease Mother    Heart disease Father    Heart attack Father    Hyperlipidemia Father    Hypertension Father    Breast cancer Neg Hx     Allergies  Allergen Reactions   Livalo [Pitavastatin]    Statins     Current Outpatient Medications on File Prior to Visit  Medication Sig Dispense Refill   aspirin EC 81 MG tablet Take 81 mg by mouth daily.     Cholecalciferol (VITAMIN D3) 50 MCG (2000 UT) TABS 1 tablet     cyanocobalamin 500 MCG tablet Take 500 mcg by mouth daily.     ezetimibe (ZETIA) 10 MG tablet TAKE 1 TABLET BY MOUTH EVERY DAY FOR CHOLESTEROL 90 tablet 0   hydrOXYzine (ATARAX) 10 MG tablet Take 1 tablet (10 mg total) by mouth 2 (two) times daily as needed for anxiety. 60 tablet 0   loperamide (IMODIUM) 2 MG capsule Taking 1/2 capsule  by mouth at bedtime as needed     Omega-3 Fatty Acids (FISH OIL PO) Take 1,200 mg by mouth 2 (two) times daily.     Oyster Shell Calcium 500 MG TABS      valsartan-hydrochlorothiazide (DIOVAN-HCT) 80-12.5 MG tablet TAKE 1 TABLET BY MOUTH EVERY DAY FOR BLOOD PRESSURE 90 tablet 1   No current facility-administered medications on file prior to visit.    BP 132/72   Pulse 63   Temp 99 F (37.2 C) (Temporal)   Ht 5\' 2"  (1.575 m)   Wt 165 lb (74.8 kg)   SpO2 98%   BMI 30.18 kg/m  Objective:   Physical Exam Cardiovascular:     Rate and Rhythm: Normal rate and regular rhythm.  Pulmonary:     Effort:  Pulmonary effort is normal.     Breath sounds: Normal breath sounds.  Abdominal:     General: Bowel sounds are normal.     Palpations: Abdomen is soft.     Tenderness: There is no abdominal tenderness.  Musculoskeletal:     Cervical back: Neck supple.  Skin:    General: Skin is warm and dry.           Assessment & Plan:  Essential hypertension Assessment & Plan: Controlled.  Metoprolol succinate causing symptomatic bradycardia. Reduce metoprolol succinate to 25 mg daily. She will monitor. Continue valsartan-HCTZ 80-12.5 mg daily. CMP pending  Orders: -     Metoprolol Succinate ER; Take 1 tablet (25 mg total) by mouth daily. For palpitations  Dispense: 90 tablet; Refill: 3 -     Comprehensive metabolic panel  Prediabetes Assessment & Plan: Repeat A1C pending.    Orders: -     Hemoglobin A1c  Hyperlipidemia, unspecified hyperlipidemia type Assessment & Plan: Repeat lipid panel pending.  Continue Zetia 10 mg daily.   Orders: -     Lipid panel  OSA (obstructive sleep apnea) Assessment & Plan: Continue nightly CPAP use.   Non-alcoholic fatty liver disease Assessment & Plan: Repeat liver enzymes pending.   Chronic pain of right hip Assessment & Plan: Offered physical therapy vs orthopedic referral, she kindly declines but will update if she becomes  interested.  Discussed home stretching and exercises.   Chronic pain of right knee Assessment & Plan: Offered physical therapy vs orthopedic referral, she kindly declines but will update if she becomes interested.  Discussed home stretching and exercises.   Chronic right-sided low back pain without sciatica Assessment & Plan: Offered physical therapy vs orthopedic referral, she kindly declines but will update if she becomes interested.  Discussed home stretching and exercises.   Situational anxiety Assessment & Plan: Overall controlled.  Continue hydroxyzine 10 mg daily PRN.         [name], NP

## 2022-10-01 NOTE — Assessment & Plan Note (Signed)
Repeat A1C pending. 

## 2022-10-01 NOTE — Assessment & Plan Note (Signed)
Repeat lipid panel pending. Continue Zetia 10 mg daily. 

## 2022-10-01 NOTE — Assessment & Plan Note (Signed)
Overall controlled.  Continue hydroxyzine 10 mg daily PRN.

## 2022-10-01 NOTE — Assessment & Plan Note (Signed)
Repeat liver enzymes pending. 

## 2022-10-01 NOTE — Assessment & Plan Note (Signed)
Continue nightly CPAP use. 

## 2022-10-01 NOTE — Assessment & Plan Note (Signed)
Controlled.  Metoprolol succinate causing symptomatic bradycardia. Reduce metoprolol succinate to 25 mg daily. She will monitor. Continue valsartan-HCTZ 80-12.5 mg daily. CMP pending

## 2022-10-13 ENCOUNTER — Other Ambulatory Visit: Payer: Self-pay | Admitting: Primary Care

## 2022-10-13 DIAGNOSIS — E785 Hyperlipidemia, unspecified: Secondary | ICD-10-CM

## 2022-10-13 DIAGNOSIS — I10 Essential (primary) hypertension: Secondary | ICD-10-CM

## 2022-10-14 ENCOUNTER — Encounter: Payer: Self-pay | Admitting: Gastroenterology

## 2022-10-14 DIAGNOSIS — K529 Noninfective gastroenteritis and colitis, unspecified: Secondary | ICD-10-CM

## 2022-10-15 DIAGNOSIS — K529 Noninfective gastroenteritis and colitis, unspecified: Secondary | ICD-10-CM | POA: Diagnosis not present

## 2022-10-17 ENCOUNTER — Other Ambulatory Visit: Payer: Self-pay | Admitting: Primary Care

## 2022-10-17 DIAGNOSIS — F418 Other specified anxiety disorders: Secondary | ICD-10-CM

## 2022-10-17 LAB — GI PROFILE, STOOL, PCR

## 2022-11-05 DIAGNOSIS — M1712 Unilateral primary osteoarthritis, left knee: Secondary | ICD-10-CM | POA: Diagnosis not present

## 2022-11-05 DIAGNOSIS — M13862 Other specified arthritis, left knee: Secondary | ICD-10-CM | POA: Diagnosis not present

## 2022-11-05 DIAGNOSIS — M13861 Other specified arthritis, right knee: Secondary | ICD-10-CM | POA: Diagnosis not present

## 2022-11-26 DIAGNOSIS — M170 Bilateral primary osteoarthritis of knee: Secondary | ICD-10-CM | POA: Diagnosis not present

## 2022-12-20 DIAGNOSIS — I10 Essential (primary) hypertension: Secondary | ICD-10-CM

## 2023-01-03 ENCOUNTER — Ambulatory Visit: Payer: Medicare Other | Admitting: Primary Care

## 2023-01-07 ENCOUNTER — Ambulatory Visit: Payer: Medicare Other | Admitting: Primary Care

## 2023-01-07 MED ORDER — METOPROLOL SUCCINATE ER 25 MG PO TB24
25.0000 mg | ORAL_TABLET | Freq: Every day | ORAL | 1 refills | Status: DC
Start: 2023-01-07 — End: 2023-10-12

## 2023-01-15 ENCOUNTER — Other Ambulatory Visit: Payer: Self-pay | Admitting: Primary Care

## 2023-01-15 DIAGNOSIS — F418 Other specified anxiety disorders: Secondary | ICD-10-CM

## 2023-01-31 DIAGNOSIS — M25562 Pain in left knee: Secondary | ICD-10-CM | POA: Diagnosis not present

## 2023-02-05 ENCOUNTER — Encounter (INDEPENDENT_AMBULATORY_CARE_PROVIDER_SITE_OTHER): Payer: Self-pay

## 2023-02-12 ENCOUNTER — Ambulatory Visit (INDEPENDENT_AMBULATORY_CARE_PROVIDER_SITE_OTHER): Payer: Medicare Other

## 2023-02-12 VITALS — Ht 62.0 in | Wt 153.0 lb

## 2023-02-12 DIAGNOSIS — Z0000 Encounter for general adult medical examination without abnormal findings: Secondary | ICD-10-CM

## 2023-02-12 DIAGNOSIS — Z1231 Encounter for screening mammogram for malignant neoplasm of breast: Secondary | ICD-10-CM

## 2023-02-12 NOTE — Progress Notes (Signed)
Subjective:   Amy Wilcox is a 72 y.o. female who presents for Medicare Annual (Subsequent) preventive examination.  Visit Complete: Virtual  I connected with  [name] on 02/12/23 by a audio enabled telemedicine application and verified that I am speaking with the correct person using two identifiers.  Patient Location: Home  Provider Location: Office/Clinic  I discussed the limitations of evaluation and management by telemedicine. The patient expressed understanding and agreed to proceed.  Patient Medicare AWV questionnaire was completed by the patient on 02/06/23; I have confirmed that all information answered by patient is correct and no changes since this date.  Vital Signs: Unable to obtain new vitals due to this being a telehealth visit.   Review of Systems      Cardiac Risk Factors include: sedentary lifestyle;hypertension;dyslipidemia     Objective:    Today's Vitals   02/12/23 1342  Weight: 153 lb (69.4 kg)  Height: 5\' 2"  (1.575 m)   Body mass index is 27.98 kg/m.     02/12/2023    1:49 PM 01/31/2022    9:45 AM 11/19/2021    1:09 PM 01/30/2021   10:26 AM 01/28/2020   10:29 AM 09/27/2019    8:06 AM 01/25/2019   11:08 AM  Advanced Directives  Does Patient Have a Medical Advance Directive? Yes Yes No Yes Yes Yes Yes  Type of Estate agent of [place];Living will Healthcare Power of [place];Living will  Healthcare Power of [place];Living will Healthcare Power of [place];Living will Living will Healthcare Power of [place];Living will  Does patient want to make changes to medical advance directive?       No - Patient declined  Copy of Healthcare Power of Attorney in Chart? No - copy requested No - copy requested  No - copy requested No - copy requested  No - copy requested  Would patient like information on creating a medical advance directive?   No - Patient declined        Current Medications (verified) Outpatient Encounter Medications as  of 02/12/2023  Medication Sig   aspirin EC 81 MG tablet Take 81 mg by mouth daily.   Cholecalciferol (VITAMIN D3) 50 MCG (2000 UT) TABS 1 tablet   cyanocobalamin 500 MCG tablet Take 500 mcg by mouth daily.   ezetimibe (ZETIA) 10 MG tablet TAKE 1 TABLET BY MOUTH EVERY DAY FOR CHOLESTEROL   hydrOXYzine (ATARAX) 10 MG tablet TAKE 1 TABLET (10 MG TOTAL) BY MOUTH TWICE A DAY AS NEEDED FOR ANXIETY   loperamide (IMODIUM) 2 MG capsule Taking 1/2 capsule by mouth at bedtime as needed   metoprolol succinate (TOPROL-XL) 25 MG 24 hr tablet Take 1 tablet (25 mg total) by mouth daily.   Omega-3 Fatty Acids (FISH OIL PO) Take 1,200 mg by mouth 2 (two) times daily.   Oyster Shell Calcium 500 MG TABS    valsartan-hydrochlorothiazide (DIOVAN-HCT) 80-12.5 MG tablet TAKE 1 TABLET BY MOUTH EVERY DAY FOR BLOOD PRESSURE   No facility-administered encounter medications on file as of 02/12/2023.    Allergies (verified) Livalo [pitavastatin] and Statins   History: Past Medical History:  Diagnosis Date   Essential hypertension    Fibrocystic breast    Hyperlipidemia    IBS (irritable bowel syndrome)    Idiopathic hematuria    OSA (obstructive sleep apnea)    Past Surgical History:  Procedure Laterality Date   COLONOSCOPY WITH PROPOFOL N/A 09/27/2019   Procedure: COLONOSCOPY WITH PROPOFOL;  Surgeon: Toney Reil, MD;  Location:  [facility] ENDOSCOPY;  Service: Gastroenterology;  Laterality: N/A;   ESOPHAGOGASTRODUODENOSCOPY (EGD) WITH PROPOFOL N/A 09/27/2019   Procedure: ESOPHAGOGASTRODUODENOSCOPY (EGD) WITH PROPOFOL;  Surgeon: Toney Reil, MD;  Location: [facility] ENDOSCOPY;  Service: Gastroenterology;  Laterality: N/A;   TONSILLECTOMY AND ADENOIDECTOMY  1961   Family History  Problem Relation Age of Onset   Hypertension Mother    Hyperlipidemia Mother    Alzheimer's disease Mother    Heart disease Father    Heart attack Father    Hyperlipidemia Father    Hypertension Father    Breast cancer Neg Hx     Social History   Socioeconomic History   Marital status: Married    Spouse name: Not on file   Number of children: Not on file   Years of education: Not on file   Highest education level: Some college, no degree  Occupational History   Occupation: retired  Tobacco Use   Smoking status: Never   Smokeless tobacco: Never  Vaping Use   Vaping status: Never Used  Substance and Sexual Activity   Alcohol use: No   Drug use: No   Sexual activity: Yes  Other Topics Concern   Not on file  Social History Narrative   Married.   3 children.   Retired. Once worked a Futures trader.   Enjoys spending time with family.   Social Determinants of Health   Financial Resource Strain: Low Risk  (02/12/2023)   Overall Financial Resource Strain (CARDIA)    Difficulty of Paying Living Expenses: Not hard at all  Food Insecurity: No Food Insecurity (02/12/2023)   Hunger Vital Sign    Worried About Running Out of Food in the Last Year: Never true    Ran Out of Food in the Last Year: Never true  Transportation Needs: No Transportation Needs (02/12/2023)   PRAPARE - Administrator, Civil Service (Medical): No    Lack of Transportation (Non-Medical): No  Physical Activity: Insufficiently Active (02/12/2023)   Exercise Vital Sign    Days of Exercise per Week: 5 days    Minutes of Exercise per Session: 20 min  Stress: No Stress Concern Present (02/12/2023)   [employer] of Occupational Health - Occupational Stress Questionnaire    Feeling of Stress : Not at all  Social Connections: Moderately Integrated (02/12/2023)   Social Connection and Isolation Panel [NHANES]    Frequency of Communication with Friends and Family: Three times a week    Frequency of Social Gatherings with Friends and Family: Three times a week    Attends Religious Services: More than 4 times per year    Active Member of Clubs or Organizations: No    Attends Banker Meetings: Never    Marital Status: Married     Tobacco Counseling Counseling given: Not Answered   Clinical Intake:  Pre-visit preparation completed: Yes  Pain : No/denies pain     BMI - recorded: 27.98 Nutritional Status: BMI 25 -29 Overweight Nutritional Risks: Nausea/ vomitting/ diarrhea (being treated for IBS) Diabetes: No  How often do you need to have someone help you when you read instructions, pamphlets, or other written materials from your doctor or pharmacy?: 1 - Never  Interpreter Needed?: No  Information entered by :: C. LPN   Activities of Daily Living    02/06/2023    9:30 AM  In your present state of health, do you have any difficulty performing the following activities:  Hearing? 0  Vision? 0  Difficulty concentrating  or making decisions? 0  Walking or climbing stairs? 0  Dressing or bathing? 0  Doing errands, shopping? 0  Preparing Food and eating ? N  Using the Toilet? N  In the past six months, have you accidently leaked urine? N  Do you have problems with loss of bowel control? N  Managing your Medications? N  Managing your Finances? N  Housekeeping or managing your Housekeeping? N    Patient Care Team: Doreene Nest, NP as PCP - General (Internal Medicine) [place], [facility] (Ophthalmology)  Indicate any recent Medical Services you may have received from other than [facility] providers in the past year (date may be approximate).     Assessment:   This is a routine wellness examination for Leontina.  Hearing/Vision screen Hearing Screening - Comments:: Denies hearing difficulties   Vision Screening - Comments:: Glasses - Dr.Bowen at [facility]- UTD on eye exams   Dietary issues and exercise activities discussed:     Goals Addressed             This Visit's Progress    Patient Stated       Continue to be active.       Depression Screen    02/12/2023    1:48 PM 10/01/2022    7:43 AM 01/31/2022    9:46 AM 08/21/2021    8:08 AM  01/30/2021   10:29 AM 01/23/2021   11:02 AM 01/28/2020   10:30 AM  PHQ 2/9 Scores  PHQ - 2 Score 0 0 0 0 0 0 0  PHQ- 9 Score  4  0 0 1 0    Fall Risk    02/12/2023    1:50 PM 02/06/2023    9:30 AM 10/01/2022    7:43 AM 01/31/2022    9:46 AM 01/30/2021   10:29 AM  Fall Risk   Falls in the past year? 0 0 0 0 0  Number falls in past yr: 0 0 0 0 0  Injury with Fall? 0 0 0 0 0  Risk for fall due to : No Fall Risks  No Fall Risks Medication side effect No Fall Risks  Follow up Falls prevention discussed;Falls evaluation completed  Falls evaluation completed Falls evaluation completed;Education provided;Falls prevention discussed Falls evaluation completed;Falls prevention discussed    MEDICARE RISK AT HOME:   TIMED UP AND GO:  Was the test performed?  No    Cognitive Function:    01/30/2021   10:32 AM 01/28/2020   10:31 AM 01/25/2019   11:14 AM 09/22/2017    8:15 AM  MMSE - Mini Mental State Exam  Orientation to time 5 5 5 5   Orientation to Place 5 5 5 5   Registration 3 3 3 3   Attention/ Calculation 5 5 5  0  Recall 3 3 3 3   Language- name 2 objects   0 0  Language- repeat 1 1 1 1   Language- follow 3 step command   0 3  Language- read & follow direction   0 0  Write a sentence   0 0  Copy design   0 0  Total score   22 20        02/12/2023    1:51 PM 01/31/2022    9:48 AM  6CIT Screen  What Year? 0 points 0 points  What month? 0 points 0 points  What time? 0 points 0 points  Count back from 20 0 points 0 points  Months in reverse 0  points 0 points  Repeat phrase 0 points 2 points  Total Score 0 points 2 points    Immunizations Immunization History  Administered Date(s) Administered   Fluad Quad(high Dose 65+) 05/25/2020   Influenza, High Dose Seasonal PF 04/08/2018, 04/02/2019   Influenza-Unspecified 05/03/2021, 05/03/2021   PFIZER(Purple Top)SARS-COV-2 Vaccination 09/19/2019, 10/11/2019   Pneumococcal Conjugate-13 04/30/2016   Pneumococcal Polysaccharide-23  10/02/2017   Tdap 03/17/2007    TDAP status: Due, Education has been provided regarding the importance of this vaccine. Advised may receive this vaccine at local pharmacy or Health Dept. Aware to provide a copy of the vaccination record if obtained from local pharmacy or Health Dept. Verbalized acceptance and understanding.  Flu Vaccine status: Due, Education has been provided regarding the importance of this vaccine. Advised may receive this vaccine at local pharmacy or Health Dept. Aware to provide a copy of the vaccination record if obtained from local pharmacy or Health Dept. Verbalized acceptance and understanding.  Pneumococcal vaccine status: Up to date  Covid-19 vaccine status: Declined, Education has been provided regarding the importance of this vaccine but patient still declined. Advised may receive this vaccine at local pharmacy or Health Dept.or vaccine clinic. Aware to provide a copy of the vaccination record if obtained from local pharmacy or Health Dept. Verbalized acceptance and understanding.  Qualifies for Shingles Vaccine? Yes   Zostavax completed No   Shingrix Completed?: No.    Education has been provided regarding the importance of this vaccine. Patient has been advised to call insurance company to determine out of pocket expense if they have not yet received this vaccine. Advised may also receive vaccine at local pharmacy or Health Dept. Verbalized acceptance and understanding.  Screening Tests Health Maintenance  Topic Date Due   Zoster Vaccines- Shingrix (1 of 2) Never done   COVID-19 Vaccine (3 - Pfizer risk series) 11/08/2019   INFLUENZA VACCINE  02/06/2023   Medicare Annual Wellness (AWV)  02/12/2024   MAMMOGRAM  05/27/2024   Colonoscopy  09/26/2029   Pneumonia Vaccine 4+ Years old  Completed   DEXA SCAN  Completed   Hepatitis C Screening  Completed   HPV VACCINES  Aged Out   DTaP/Tdap/Td  Discontinued    Health Maintenance  Health Maintenance Due   Topic Date Due   Zoster Vaccines- Shingrix (1 of 2) Never done   COVID-19 Vaccine (3 - Pfizer risk series) 11/08/2019   INFLUENZA VACCINE  02/06/2023    Colorectal cancer screening: Type of screening: Colonoscopy. Completed 09/27/19. Repeat every 10 years  Mammogram status: Ordered 02/12/23. Pt provided with contact info and advised to call to schedule appt.   Bone Density status: Completed 05/27/22. Results reflect: Bone density results: OSTEOPENIA. Repeat every 2 years.  Lung Cancer Screening: (Low Dose CT Chest recommended if Age 28-80 years, 20 pack-year currently smoking OR have quit w/in 15years.) does not qualify.   Lung Cancer Screening Referral: no  Additional Screening:  Hepatitis C Screening: does qualify; Completed 12/17/17  Vision Screening: Recommended annual ophthalmology exams for early detection of glaucoma and other disorders of the eye. Is the patient up to date with their annual eye exam?  Yes  Who is the provider or what is the name of the office in which the patient attends annual eye exams? Dr.Bowen If pt is not established with a provider, would they like to be referred to a provider to establish care? Yes .   Dental Screening: Recommended annual dental exams for proper oral hygiene  Community Resource Referral / Chronic Care Management: CRR required this visit?  No   CCM required this visit?  No     Plan:     I have personally reviewed and noted the following in the patient's chart:   Medical and social history Use of alcohol, tobacco or illicit drugs  Current medications and supplements including opioid prescriptions. Patient is not currently taking opioid prescriptions. Functional ability and status Nutritional status Physical activity Advanced directives List of other physicians Hospitalizations, surgeries, and ER visits in previous 12 months Vitals Screenings to include cognitive, depression, and falls Referrals and  appointments  In addition, I have reviewed and discussed with patient certain preventive protocols, quality metrics, and best practice recommendations. A written personalized care plan for preventive services as well as general preventive health recommendations were provided to patient.     Maryan Puls, LPN   4/0/9811   After Visit Summary: (MyChart) Due to this being a telephonic visit, the after visit summary with patients personalized plan was offered to patient via MyChart   Nurse Notes: none

## 2023-02-12 NOTE — Patient Instructions (Addendum)
Amy Wilcox , Thank you for taking time to come for your Medicare Wellness Visit. I appreciate your ongoing commitment to your health goals. Please review the following plan we discussed and let me know if I can assist you in the future.   Referrals/Orders/Follow-Ups/Clinician Recommendations: Aim for 30 minutes of exercise or brisk walking, 6-8 glasses of water, and 5 servings of fruits and vegetables each day.   You have an order for:  []   2D Mammogram  [x]   3D Mammogram  []   Bone Density     Please call for appointment:  [facility]  [address] [place] [address] [phone]  [facility] [address] [place], [place] [address] [phone]  [facility] [address] Geanie Logan [place], [place] [address] [phone]    Make sure to wear two-piece clothing.  No lotions, powders, or deodorants the day of the appointment. Make sure to bring picture ID and insurance card.  Bring list of medications you are currently taking including any supplements.   Schedule your [facility] screening mammogram through MyChart!   Log into your MyChart account.  Go to 'Visit' (or 'Appointments' if on mobile App) --> Schedule an Appointment  Under 'Select a Reason for Visit' choose the Mammogram Screening option.  Complete the pre-visit questions and select the time and place that best fits your schedule.    This is a list of the screening recommended for you and due dates:  Health Maintenance  Topic Date Due   Zoster (Shingles) Vaccine (1 of 2) Never done   COVID-19 Vaccine (3 - Pfizer risk series) 11/08/2019   Medicare Annual Wellness Visit  02/01/2023   Flu Shot  02/06/2023   Mammogram  05/27/2024   Colon Cancer Screening  09/26/2029   Pneumonia Vaccine  Completed   DEXA scan (bone density measurement)  Completed   Hepatitis C Screening  Completed    HPV Vaccine  Aged Out   DTaP/Tdap/Td vaccine  Discontinued    Advanced directives: (In Chart) A copy of your advanced directives are scanned into your chart should your provider ever need it.  Next Medicare Annual Wellness Visit scheduled for next year: Yes  Preventive Care 71 Years and Older, Female Preventive care refers to lifestyle choices and visits with your health care provider that can promote health and wellness. What does preventive care include? A yearly physical exam. This is also called an annual well check. Dental exams once or twice a year. Routine eye exams. Ask your health care provider how often you should have your eyes checked. Personal lifestyle choices, including: Daily care of your teeth and gums. Regular physical activity. Eating a healthy diet. Avoiding tobacco and drug use. Limiting alcohol use. Practicing safe sex. Taking low-dose aspirin every day. Taking vitamin and mineral supplements as recommended by your health care provider. What happens during an annual well check? The services and screenings done by your health care provider during your annual well check will depend on your age, overall health, lifestyle risk factors, and family history of disease. Counseling  Your health care provider may ask you questions about your: Alcohol use. Tobacco use. Drug use. Emotional well-being. Home and relationship well-being. Sexual activity. Eating habits. History of falls. Memory and ability to understand (cognition). Work and work Astronomer. Reproductive health. Screening  You may have the following tests or measurements: Height, weight, and BMI. Blood  pressure. Lipid and cholesterol levels. These may be checked every 5 years, or more frequently if you are over 31 years old. Skin check. Lung cancer screening. You may have this screening every year starting at age 71 if you have a 30-pack-year history of smoking and currently smoke or have quit  within the past 15 years. Fecal occult blood test (FOBT) of the stool. You may have this test every year starting at age 71. Flexible sigmoidoscopy or colonoscopy. You may have a sigmoidoscopy every 5 years or a colonoscopy every 10 years starting at age 26. Hepatitis C blood test. Hepatitis B blood test. Sexually transmitted disease (STD) testing. Diabetes screening. This is done by checking your blood sugar (glucose) after you have not eaten for a while (fasting). You may have this done every 1-3 years. Bone density scan. This is done to screen for osteoporosis. You may have this done starting at age 71. Mammogram. This may be done every 1-2 years. Talk to your health care provider about how often you should have regular mammograms. Talk with your health care provider about your test results, treatment options, and if necessary, the need for more tests. Vaccines  Your health care provider may recommend certain vaccines, such as: Influenza vaccine. This is recommended every year. Tetanus, diphtheria, and acellular pertussis (Tdap, Td) vaccine. You may need a Td booster every 10 years. Zoster vaccine. You may need this after age 71. Pneumococcal 13-valent conjugate (PCV13) vaccine. One dose is recommended after age 30. Pneumococcal polysaccharide (PPSV23) vaccine. One dose is recommended after age 16. Talk to your health care provider about which screenings and vaccines you need and how often you need them. This information is not intended to replace advice given to you by your health care provider. Make sure you discuss any questions you have with your health care provider. Document Released: 07/21/2015 Document Revised: 03/13/2016 Document Reviewed: 04/25/2015 Elsevier Interactive Patient Education  2017 [employer].  Fall Prevention in the Home Falls can cause injuries. They can happen to people of all ages. There are many things you can do to make your home safe and to help prevent  falls. What can I do on the outside of my home? Regularly fix the edges of walkways and driveways and fix any cracks. Remove anything that might make you trip as you walk through a door, such as a raised step or threshold. Trim any bushes or trees on the path to your home. Use bright outdoor lighting. Clear any walking paths of anything that might make someone trip, such as rocks or tools. Regularly check to see if handrails are loose or broken. Make sure that both sides of any steps have handrails. Any raised decks and porches should have guardrails on the edges. Have any leaves, snow, or ice cleared regularly. Use sand or salt on walking paths during winter. Clean up any spills in your garage right away. This includes oil or grease spills. What can I do in the bathroom? Use night lights. Install grab bars by the toilet and in the tub and shower. Do not use towel bars as grab bars. Use non-skid mats or decals in the tub or shower. If you need to sit down in the shower, use a plastic, non-slip stool. Keep the floor dry. Clean up any water that spills on the floor as soon as it happens. Remove soap buildup in the tub or shower regularly. Attach bath mats securely with double-sided non-slip rug tape. Do not have throw  rugs and other things on the floor that can make you trip. What can I do in the bedroom? Use night lights. Make sure that you have a light by your bed that is easy to reach. Do not use any sheets or blankets that are too big for your bed. They should not hang down onto the floor. Have a firm chair that has side arms. You can use this for support while you get dressed. Do not have throw rugs and other things on the floor that can make you trip. What can I do in the kitchen? Clean up any spills right away. Avoid walking on wet floors. Keep items that you use a lot in easy-to-reach places. If you need to reach something above you, use a strong step stool that has a grab  bar. Keep electrical cords out of the way. Do not use floor polish or wax that makes floors slippery. If you must use wax, use non-skid floor wax. Do not have throw rugs and other things on the floor that can make you trip. What can I do with my stairs? Do not leave any items on the stairs. Make sure that there are handrails on both sides of the stairs and use them. Fix handrails that are broken or loose. Make sure that handrails are as long as the stairways. Check any carpeting to make sure that it is firmly attached to the stairs. Fix any carpet that is loose or worn. Avoid having throw rugs at the top or bottom of the stairs. If you do have throw rugs, attach them to the floor with carpet tape. Make sure that you have a light switch at the top of the stairs and the bottom of the stairs. If you do not have them, ask someone to add them for you. What else can I do to help prevent falls? Wear shoes that: Do not have high heels. Have rubber bottoms. Are comfortable and fit you well. Are closed at the toe. Do not wear sandals. If you use a stepladder: Make sure that it is fully opened. Do not climb a closed stepladder. Make sure that both sides of the stepladder are locked into place. Ask someone to hold it for you, if possible. Clearly mark and make sure that you can see: Any grab bars or handrails. First and last steps. Where the edge of each step is. Use tools that help you move around (mobility aids) if they are needed. These include: Canes. Walkers. Scooters. Crutches. Turn on the lights when you go into a dark area. Replace any light bulbs as soon as they burn out. Set up your furniture so you have a clear path. Avoid moving your furniture around. If any of your floors are uneven, fix them. If there are any pets around you, be aware of where they are. Review your medicines with your doctor. Some medicines can make you feel dizzy. This can increase your chance of falling. Ask  your doctor what other things that you can do to help prevent falls. This information is not intended to replace advice given to you by your health care provider. Make sure you discuss any questions you have with your health care provider. Document Released: 04/20/2009 Document Revised: 11/30/2015 Document Reviewed: 07/29/2014 Elsevier Interactive Patient Education  2017 [employer].

## 2023-02-18 ENCOUNTER — Ambulatory Visit: Payer: Medicare Other | Admitting: Primary Care

## 2023-02-18 ENCOUNTER — Encounter: Payer: Self-pay | Admitting: Primary Care

## 2023-02-18 VITALS — BP 128/68 | HR 67 | Temp 97.1°F | Wt 157.0 lb

## 2023-02-18 DIAGNOSIS — E1165 Type 2 diabetes mellitus with hyperglycemia: Secondary | ICD-10-CM | POA: Diagnosis not present

## 2023-02-18 DIAGNOSIS — R311 Benign essential microscopic hematuria: Secondary | ICD-10-CM | POA: Diagnosis not present

## 2023-02-18 LAB — POC URINALSYSI DIPSTICK (AUTOMATED)
Bilirubin, UA: NEGATIVE
Blood, UA: NEGATIVE
Glucose, UA: NEGATIVE
Ketones, UA: NEGATIVE
Leukocytes, UA: NEGATIVE
Nitrite, UA: NEGATIVE
Protein, UA: NEGATIVE
Spec Grav, UA: 1.025 (ref 1.010–1.025)
Urobilinogen, UA: 0.2 E.U./dL
pH, UA: 6 (ref 5.0–8.0)

## 2023-02-18 LAB — MICROALBUMIN / CREATININE URINE RATIO
Creatinine,U: 82.4 mg/dL
Microalb Creat Ratio: 0.8 mg/g (ref 0.0–30.0)
Microalb, Ur: <0.7 mg/dL (ref 0.0–1.9)

## 2023-02-18 LAB — POCT GLYCOSYLATED HEMOGLOBIN (HGB A1C): Hemoglobin A1C: 6.4 % — AB (ref 4.0–5.6)

## 2023-02-18 MED ORDER — BLOOD GLUCOSE MONITORING SUPPL DEVI
1.0000 | Freq: Three times a day (TID) | 0 refills | Status: AC
Start: 2023-02-18 — End: ?

## 2023-02-18 MED ORDER — LANCETS MISC. MISC
1.0000 | Freq: Three times a day (TID) | 0 refills | Status: AC
Start: 2023-02-18 — End: 2023-03-20

## 2023-02-18 MED ORDER — LANCET DEVICE MISC
1.0000 | Freq: Three times a day (TID) | 0 refills | Status: AC
Start: 2023-02-18 — End: 2023-03-20

## 2023-02-18 MED ORDER — BLOOD GLUCOSE TEST VI STRP
1.0000 | ORAL_STRIP | Freq: Three times a day (TID) | 0 refills | Status: DC
Start: 2023-02-18 — End: 2023-07-11

## 2023-02-18 NOTE — Assessment & Plan Note (Addendum)
Completed work up in the past per Urology, determined to be benign.   Asymptomatic.   UA pending today.

## 2023-02-18 NOTE — Progress Notes (Signed)
Subjective:    Patient ID: Amy Wilcox, female    DOB: Aug 29, 1950, 72 y.o.   MRN: [id number]  HPI  Amy Wilcox is a very pleasant 72 y.o. female with a history of hypertension, OSA, hyperlipidemia, newly diagnosed type 2 diabetes who presents today for follow-up of diabetes.  Newly diagnosed with type 2 diabetes in May 2024 with A1c of 6.5.  At the time we discussed options for treatment which included dietary changes versus medication.  She opted for dietary and lifestyle changes.  She is here for follow-up today.  Current medications include: None  She is checking her blood glucose 0 times daily.  Last A1C: 6.5 in May 2024, 6.4 today Last Eye Exam: Up-to-date Last Foot Exam: Due Pneumonia Vaccination: 2019 Urine Microalbumin: Due. History of benign microscopic hematuria.  Statin: Statin intolerant.  Dietary changes since last visit: She's cutting back on sugar. Mostly home cooked meals.    Exercise: Active.   Wt Readings from Last 3 Encounters:  02/18/23 157 lb (71.2 kg)  02/12/23 153 lb (69.4 kg)  10/01/22 165 lb (74.8 kg)       Review of Systems  Respiratory:  Negative for shortness of breath.   Cardiovascular:  Negative for chest pain.  Neurological:  Negative for dizziness and numbness.         Past Medical History:  Diagnosis Date   Essential hypertension    Fibrocystic breast    Hyperlipidemia    IBS (irritable bowel syndrome)    Idiopathic hematuria    OSA (obstructive sleep apnea)     Social History   Socioeconomic History   Marital status: Married    Spouse name: Not on file   Number of children: Not on file   Years of education: Not on file   Highest education level: Some college, no degree  Occupational History   Occupation: retired  Tobacco Use   Smoking status: Never   Smokeless tobacco: Never  Vaping Use   Vaping status: Never Used  Substance and Sexual Activity   Alcohol use: No   Drug use: No   Sexual activity: Yes  Other  Topics Concern   Not on file  Social History Narrative   Married.   3 children.   Retired. Once worked a Futures trader.   Enjoys spending time with family.   Social Determinants of Health   Financial Resource Strain: Low Risk  (02/12/2023)   Overall Financial Resource Strain (CARDIA)    Difficulty of Paying Living Expenses: Not hard at all  Food Insecurity: No Food Insecurity (02/12/2023)   Hunger Vital Sign    Worried About Running Out of Food in the Last Year: Never true    Ran Out of Food in the Last Year: Never true  Transportation Needs: No Transportation Needs (02/12/2023)   PRAPARE - Administrator, Civil Service (Medical): No    Lack of Transportation (Non-Medical): No  Physical Activity: Insufficiently Active (02/12/2023)   Exercise Vital Sign    Days of Exercise per Week: 5 days    Minutes of Exercise per Session: 20 min  Stress: No Stress Concern Present (02/12/2023)   [employer] of Occupational Health - Occupational Stress Questionnaire    Feeling of Stress : Not at all  Social Connections: Moderately Integrated (02/12/2023)   Social Connection and Isolation Panel [NHANES]    Frequency of Communication with Friends and Family: Three times a week    Frequency of Social Gatherings with Friends and Family:  Three times a week    Attends Religious Services: More than 4 times per year    Active Member of Clubs or Organizations: No    Attends Banker Meetings: Never    Marital Status: Married  Catering manager Violence: Not At Risk (02/12/2023)   Humiliation, Afraid, Rape, and Kick questionnaire    Fear of Current or Ex-Partner: No    Emotionally Abused: No    Physically Abused: No    Sexually Abused: No    Past Surgical History:  Procedure Laterality Date   COLONOSCOPY WITH PROPOFOL N/A 09/27/2019   Procedure: COLONOSCOPY WITH PROPOFOL;  Surgeon: Toney Reil, MD;  Location: [facility] ENDOSCOPY;  Service: Gastroenterology;  Laterality: N/A;    ESOPHAGOGASTRODUODENOSCOPY (EGD) WITH PROPOFOL N/A 09/27/2019   Procedure: ESOPHAGOGASTRODUODENOSCOPY (EGD) WITH PROPOFOL;  Surgeon: Toney Reil, MD;  Location: [facility] ENDOSCOPY;  Service: Gastroenterology;  Laterality: N/A;   TONSILLECTOMY AND ADENOIDECTOMY  1961    Family History  Problem Relation Age of Onset   Hypertension Mother    Hyperlipidemia Mother    Alzheimer's disease Mother    Heart disease Father    Heart attack Father    Hyperlipidemia Father    Hypertension Father    Breast cancer Neg Hx     Allergies  Allergen Reactions   Livalo [Pitavastatin]    Statins     Current Outpatient Medications on File Prior to Visit  Medication Sig Dispense Refill   aspirin EC 81 MG tablet Take 81 mg by mouth daily.     Cholecalciferol (VITAMIN D3) 50 MCG (2000 UT) TABS 1 tablet     cyanocobalamin 500 MCG tablet Take 500 mcg by mouth daily.     ezetimibe (ZETIA) 10 MG tablet TAKE 1 TABLET BY MOUTH EVERY DAY FOR CHOLESTEROL 90 tablet 3   hydrOXYzine (ATARAX) 10 MG tablet TAKE 1 TABLET (10 MG TOTAL) BY MOUTH TWICE A DAY AS NEEDED FOR ANXIETY 180 tablet 0   loperamide (IMODIUM) 2 MG capsule Taking 1/2 capsule by mouth at bedtime as needed     metoprolol succinate (TOPROL-XL) 25 MG 24 hr tablet Take 1 tablet (25 mg total) by mouth daily. 90 tablet 1   Omega-3 Fatty Acids (FISH OIL PO) Take 1,200 mg by mouth 2 (two) times daily.     Oyster Shell Calcium 500 MG TABS      valsartan-hydrochlorothiazide (DIOVAN-HCT) 80-12.5 MG tablet TAKE 1 TABLET BY MOUTH EVERY DAY FOR BLOOD PRESSURE 90 tablet 3   No current facility-administered medications on file prior to visit.    BP 128/68   Pulse 67   Temp (!) 97.1 F (36.2 C) (Temporal)   Wt 157 lb (71.2 kg)   SpO2 99%   BMI 28.72 kg/m  Objective:   Physical Exam Cardiovascular:     Rate and Rhythm: Normal rate and regular rhythm.  Pulmonary:     Effort: Pulmonary effort is normal.     Breath sounds: Normal breath sounds.   Musculoskeletal:     Cervical back: Neck supple.  Skin:    General: Skin is warm and dry.           Assessment & Plan:  Type 2 diabetes mellitus with hyperglycemia, without long-term current use of insulin (HCC) -     POCT glycosylated hemoglobin (Hb A1C) -     Microalbumin / creatinine urine ratio -     Blood Glucose Monitoring Suppl; 1 each by Does not apply route in the morning, at  noon, and at bedtime. May substitute to any manufacturer covered by patient's insurance.  Dispense: 1 each; Refill: 0 -     Blood Glucose Test; 1 each by In Vitro route in the morning, at noon, and at bedtime. May substitute to any manufacturer covered by patient's insurance.  Dispense: 100 strip; Refill: 0 -     Lancet Device; 1 each by Does not apply route in the morning, at noon, and at bedtime. May substitute to any manufacturer covered by patient's insurance.  Dispense: 1 each; Refill: 0 -     Lancets Misc.; 1 each by Does not apply route in the morning, at noon, and at bedtime. May substitute to any manufacturer covered by patient's insurance.  Dispense: 100 each; Refill: 0  Benign microscopic hematuria Assessment & Plan: Completed work up in the past per Urology, determined to be benign.   Asymptomatic.   UA pending today.  Orders: -     POCT Urinalysis Dipstick (Automated)        Doreene Nest, NP

## 2023-02-18 NOTE — Patient Instructions (Addendum)
Stop by the lab prior to leaving today. I will notify you of your results once received.   Start checking your blood sugar levels.  Appropriate times to check your blood sugar levels are:  -Before any meal (breakfast, lunch, dinner) -Two hours after any meal (breakfast, lunch, dinner) -Bedtime  Your blood sugars should run below 150 consistently.   Please schedule a follow up visit for 6 months for a diabetes check.  It was a pleasure to see you today!

## 2023-02-20 ENCOUNTER — Encounter (INDEPENDENT_AMBULATORY_CARE_PROVIDER_SITE_OTHER): Payer: Self-pay

## 2023-03-04 ENCOUNTER — Encounter: Payer: Self-pay | Admitting: Nurse Practitioner

## 2023-03-04 ENCOUNTER — Telehealth (INDEPENDENT_AMBULATORY_CARE_PROVIDER_SITE_OTHER): Payer: Medicare Other | Admitting: Nurse Practitioner

## 2023-03-04 VITALS — BP 136/77 | HR 104 | Temp 98.6°F | Ht 62.0 in

## 2023-03-04 DIAGNOSIS — U071 COVID-19: Secondary | ICD-10-CM | POA: Diagnosis not present

## 2023-03-04 DIAGNOSIS — R051 Acute cough: Secondary | ICD-10-CM | POA: Diagnosis not present

## 2023-03-04 MED ORDER — NIRMATRELVIR/RITONAVIR (PAXLOVID) TABLET (RENAL DOSING): 2.0000 | ORAL_TABLET | Freq: Two times a day (BID) | ORAL | 0 refills | Status: AC

## 2023-03-04 MED ORDER — GUAIFENESIN-CODEINE 100-10 MG/5ML PO SOLN
5.0000 mL | Freq: Three times a day (TID) | ORAL | 0 refills | Status: AC | PRN
Start: 2023-03-04 — End: 2023-03-11

## 2023-03-04 NOTE — Patient Instructions (Signed)
Nice to see you today. Quarantine guidelines are you need to be fever free for 24 hours without the use of fever reducing medication and symptoms improving before leaving quarantine.  At that juncture please wear mask for 5 days when around others.

## 2023-03-04 NOTE — Progress Notes (Signed)
Ph: [phone] Fax: [phone]   Patient ID: Amy Wilcox, female    DOB: March 13, 1951, 72 y.o.   MRN: [id number]  Virtual visit completed through MyChart, a video enabled telemedicine application. Due to national recommendations of social distancing due to COVID-19, a virtual visit is felt to be most appropriate for this patient at this time. Reviewed limitations, risks, security and privacy concerns of performing a virtual visit and the availability of in person appointments. I also reviewed that there may be a patient responsible charge related to this service. The patient agreed to proceed.   Patient location: home Provider location: [place] [facility], office Persons participating in this virtual visit: patient, provider   If any vitals were documented, they were collected by patient at home unless specified below.    BP 136/77 Comment: per patient  Pulse (!) 104 Comment: per patient  Temp 98.6 F (37 C) Comment: per patient  Ht 5\' 2"  (1.575 m) Comment: per chart  BMI 28.72 kg/m    CC: Covid 19 Subjective:   HPI: Amy Wilcox is a 72 y.o. female presenting on 03/04/2023 for Covid Positive (Nonstop cough, body aches, feels lousy. )    Symptoms started on 03/02/2023 Test positive on 03/04/2023 No sick contacts Covid vaccine: original 2 States that she has been using tylenol and delsym that has not given relief    Relevant past medical, surgical, family and social history reviewed and updated as indicated. Interim medical history since our last visit reviewed. Allergies and medications reviewed and updated. Outpatient Medications Prior to Visit  Medication Sig Dispense Refill   aspirin EC 81 MG tablet Take 81 mg by mouth daily.     Blood Glucose Monitoring Suppl DEVI 1 each by Does not apply route in the morning, at noon, and at bedtime. May substitute to any manufacturer covered by patient's insurance. 1 each 0   Cholecalciferol (VITAMIN D3) 50 MCG (2000 UT) TABS 1  tablet     cyanocobalamin 500 MCG tablet Take 500 mcg by mouth daily.     ezetimibe (ZETIA) 10 MG tablet TAKE 1 TABLET BY MOUTH EVERY DAY FOR CHOLESTEROL 90 tablet 3   Glucose Blood (BLOOD GLUCOSE TEST STRIPS) STRP 1 each by In Vitro route in the morning, at noon, and at bedtime. May substitute to any manufacturer covered by patient's insurance. 100 strip 0   hydrOXYzine (ATARAX) 10 MG tablet TAKE 1 TABLET (10 MG TOTAL) BY MOUTH TWICE A DAY AS NEEDED FOR ANXIETY 180 tablet 0   Lancet Device MISC 1 each by Does not apply route in the morning, at noon, and at bedtime. May substitute to any manufacturer covered by patient's insurance. 1 each 0   Lancets Misc. MISC 1 each by Does not apply route in the morning, at noon, and at bedtime. May substitute to any manufacturer covered by patient's insurance. 100 each 0   loperamide (IMODIUM) 2 MG capsule Taking 1/2 capsule by mouth at bedtime as needed     metoprolol succinate (TOPROL-XL) 25 MG 24 hr tablet Take 1 tablet (25 mg total) by mouth daily. 90 tablet 1   Omega-3 Fatty Acids (FISH OIL PO) Take 1,200 mg by mouth 2 (two) times daily.     Oyster Shell Calcium 500 MG TABS      valsartan-hydrochlorothiazide (DIOVAN-HCT) 80-12.5 MG tablet TAKE 1 TABLET BY MOUTH EVERY DAY FOR BLOOD PRESSURE 90 tablet 3   No facility-administered medications prior to visit.     Per HPI  unless specifically indicated in ROS section below Review of Systems  Constitutional:  Positive for chills, fatigue and fever (subjective).  HENT:  Positive for ear discharge (fullness) and sore throat (improved).   Respiratory:  Positive for cough. Negative for shortness of breath.   Gastrointestinal:  Negative for abdominal pain, diarrhea, nausea and vomiting.  Musculoskeletal:  Positive for myalgias.  Neurological:  Positive for headaches.   Objective:  BP 136/77 Comment: per patient  Pulse (!) 104 Comment: per patient  Temp 98.6 F (37 C) Comment: per patient  Ht 5\' 2"  (1.575  m) Comment: per chart  BMI 28.72 kg/m   Wt Readings from Last 3 Encounters:  02/18/23 157 lb (71.2 kg)  02/12/23 153 lb (69.4 kg)  10/01/22 165 lb (74.8 kg)       Physical exam: Gen: alert, NAD, not ill appearing Pulm: speaks in complete sentences without increased work of breathing Psych: normal mood, normal thought content      Results for orders placed or performed in visit on 02/18/23  Microalbumin / creatinine urine ratio  Result Value Ref Range   Microalb, Ur <0.7 0.0 - 1.9 mg/dL   Creatinine,U 16.1 mg/dL   Microalb Creat Ratio 0.8 0.0 - 30.0 mg/g  POCT glycosylated hemoglobin (Hb A1C)  Result Value Ref Range   Hemoglobin A1C 6.4 (A) 4.0 - 5.6 %   HbA1c POC (<> result, manual entry)     HbA1c, POC (prediabetic range)     HbA1c, POC (controlled diabetic range)    POCT Urinalysis Dipstick (Automated)  Result Value Ref Range   Color, UA Yellow    Clarity, UA Clear    Glucose, UA Negative Negative   Bilirubin, UA Negative    Ketones, UA Negative    Spec Grav, UA 1.025 1.010 - 1.025   Blood, UA Negative    pH, UA 6.0 5.0 - 8.0   Protein, UA Negative Negative   Urobilinogen, UA 0.2 0.2 or 1.0 E.U./dL   Nitrite, UA Negative    Leukocytes, UA Negative Negative   Assessment & Plan:   COVID-19 -     nirmatrelvir/ritonavir (renal dosing); Take 2 tablets by mouth 2 (two) times daily for 5 days. (Take nirmatrelvir 150 mg one tablet twice daily for 5 days and ritonavir 100 mg one tablet twice daily for 5 days) Patient GFR is 58  Dispense: 20 tablet; Refill: 0  Acute cough -     guaiFENesin-Codeine; Take 5 mLs by mouth 3 (three) times daily as needed for up to 7 days.  Dispense: 105 mL; Refill: 0     I discussed the assessment and treatment plan with the patient. The patient was provided an opportunity to ask questions and all were answered. The patient agreed with the plan and demonstrated an understanding of the instructions. The patient was advised to call back or seek  an in-person evaluation if the symptoms worsen or if the condition fails to improve as anticipated.  Follow up plan: Return if symptoms worsen or fail to improve.  Audria Nine, NP

## 2023-04-01 ENCOUNTER — Ambulatory Visit
Admission: EM | Admit: 2023-04-01 | Discharge: 2023-04-01 | Disposition: A | Discharge status: Home / Self Care | Payer: Medicare Other | Attending: Emergency Medicine | Admitting: Emergency Medicine

## 2023-04-01 DIAGNOSIS — R102 Pelvic and perineal pain: Secondary | ICD-10-CM | POA: Insufficient documentation

## 2023-04-01 DIAGNOSIS — R350 Frequency of micturition: Secondary | ICD-10-CM | POA: Diagnosis not present

## 2023-04-01 LAB — POCT URINALYSIS DIP (MANUAL ENTRY)
Bilirubin, UA: NEGATIVE
Glucose, UA: NEGATIVE mg/dL
Ketones, POC UA: NEGATIVE mg/dL
Leukocytes, UA: NEGATIVE
Nitrite, UA: NEGATIVE
Protein Ur, POC: NEGATIVE mg/dL
Spec Grav, UA: 1.025
Urobilinogen, UA: 0.2 U/dL
pH, UA: 6

## 2023-04-01 NOTE — ED Triage Notes (Signed)
Patient to Urgent Care with complaints of urinary frequency/ suprapubic pressure/ foul odor.  Reports symptoms started at least one week ago.  Denies any known fevers.

## 2023-04-01 NOTE — Discharge Instructions (Signed)
Your urinalysis does not show any signs of infection, your urine will be sent to the lab to determine exactly which bacteria is present, if any changes need to be made to your medications you will be notified  You may use over-the-counter Pyridium to help minimize your symptoms, this medication will turn your urine orange  Increase your fluid intake through use of water  As always practice good hygiene, wiping front to back and avoidance of scented vaginal products to prevent further irritation  If symptoms continue to persist and urine culture results are negative , please follow-up with urologist or your primary doctor as needed

## 2023-04-01 NOTE — ED Provider Notes (Signed)
[facility]    CSN: [id number] Arrival date & time: 04/01/23  1146      History   Chief Complaint Chief Complaint  Patient presents with   Urinary Frequency    HPI Amy Wilcox is a 72 y.o. female.   Patient presents for evaluation of urinary frequency, foul urinary odor and lower abdominal pressure present for 7 days.  Abdominal pressure has become constant and more prominent.  Has not attempted treatment of symptoms.  Denies dysuria, hematuria, flank pain, fever or vaginal symptoms.  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/2020   Essential hypertension    Fibrocystic breast    Hyperlipidemia    IBS (irritable bowel syndrome)    Idiopathic hematuria    OSA (obstructive sleep apnea)     Patient Active Problem List   Diagnosis Date Noted   Benign microscopic hematuria 02/18/2023   Chronic pain of right hip 02/19/2022   Chronic pain of right knee 02/19/2022   Chronic right-sided low back pain without sciatica 02/19/2022   Pelvic pressure in female 02/19/2022   Epigastric abdominal pain 11/28/2021   Dependence on other enabling machines and devices 05/23/2021   Frequent headaches 02/02/2021   Gastroesophageal reflux disease with esophagitis without hemorrhage    Situational anxiety 11/03/2018   Fibrosis of liver 04/03/2018   OSA (obstructive sleep apnea) 09/29/2017   Type 2 diabetes mellitus with hyperglycemia (HCC) 05/18/2017   Non-alcoholic fatty liver disease 05/18/2017   Essential hypertension 02/20/2017   Hyperlipidemia 02/20/2017   IBS (irritable bowel syndrome) 02/20/2017    Past Surgical History:  Procedure Laterality Date   COLONOSCOPY WITH PROPOFOL N/A 09/27/2019   Procedure: COLONOSCOPY WITH PROPOFOL;  Surgeon: Toney Reil, MD;  Location: [facility] ENDOSCOPY;  Service: Gastroenterology;  Laterality: N/A;   ESOPHAGOGASTRODUODENOSCOPY (EGD) WITH PROPOFOL N/A 09/27/2019   Procedure: ESOPHAGOGASTRODUODENOSCOPY  (EGD) WITH PROPOFOL;  Surgeon: Toney Reil, MD;  Location: [facility] ENDOSCOPY;  Service: Gastroenterology;  Laterality: N/A;   TONSILLECTOMY AND ADENOIDECTOMY  1961    OB History   No obstetric history on file.      Home Medications    Prior to Admission medications   Medication Sig Start Date End Date Taking? Authorizing Provider  aspirin EC 81 MG tablet Take 81 mg by mouth daily.    [provider]  Blood Glucose Monitoring Suppl DEVI 1 each by Does not apply route in the morning, at noon, and at bedtime. May substitute to any manufacturer covered by patient's insurance. 02/18/23   Doreene Nest, NP  Cholecalciferol (VITAMIN D3) 50 MCG (2000 UT) TABS 1 tablet    [provider]  cyanocobalamin 500 MCG tablet Take 500 mcg by mouth daily.    [provider]  ezetimibe (ZETIA) 10 MG tablet TAKE 1 TABLET BY MOUTH EVERY DAY FOR CHOLESTEROL 10/13/22   Doreene Nest, NP  Glucose Blood (BLOOD GLUCOSE TEST STRIPS) STRP 1 each by In Vitro route in the morning, at noon, and at bedtime. May substitute to any manufacturer covered by patient's insurance. 02/18/23   Doreene Nest, NP  hydrOXYzine (ATARAX) 10 MG tablet TAKE 1 TABLET (10 MG TOTAL) BY MOUTH TWICE A DAY AS NEEDED FOR ANXIETY 01/15/23   Doreene Nest, NP  loperamide (IMODIUM) 2 MG capsule Taking 1/2 capsule by mouth at bedtime as needed    [provider]  metoprolol succinate (TOPROL-XL) 25 MG 24 hr tablet Take 1 tablet (25 mg total) by  mouth daily. 01/07/23   Doreene Nest, NP  Omega-3 Fatty Acids (FISH OIL PO) Take 1,200 mg by mouth 2 (two) times daily.    [provider]  Ethelda Chick Calcium 500 MG TABS     [provider]  valsartan-hydrochlorothiazide (DIOVAN-HCT) 80-12.5 MG tablet TAKE 1 TABLET BY MOUTH EVERY DAY FOR BLOOD PRESSURE 10/13/22   Doreene Nest, NP    Family History Family History  Problem Relation Age of Onset   Hypertension Mother     Hyperlipidemia Mother    Alzheimer's disease Mother    Heart disease Father    Heart attack Father    Hyperlipidemia Father    Hypertension Father    Breast cancer Neg Hx     Social History Social History   Tobacco Use   Smoking status: Never   Smokeless tobacco: Never  Vaping Use   Vaping status: Never Used  Substance Use Topics   Alcohol use: No   Drug use: No     Allergies   Livalo [pitavastatin] and Statins   Review of Systems Review of Systems   Physical Exam Triage Vital Signs ED Triage Vitals  Encounter Vitals Group     BP 04/01/23 1204 (!) 160/82     Systolic BP Percentile --      Diastolic BP Percentile --      Pulse Rate 04/01/23 1204 64     Resp 04/01/23 1204 18     Temp 04/01/23 1204 98.3 F (36.8 C)     Temp src --      SpO2 04/01/23 1204 98 %     Weight --      Height --      Head Circumference --      Peak Flow --      Pain Score 04/01/23 1203 1     Pain Loc --      Pain Education --      Exclude from Growth Chart --    No data found.  Updated Vital Signs BP (!) 160/82   Pulse 64   Temp 98.3 F (36.8 C)   Resp 18   SpO2 98%   Visual Acuity Right Eye Distance:   Left Eye Distance:   Bilateral Distance:    Right Eye Near:   Left Eye Near:    Bilateral Near:     Physical Exam Constitutional:      Appearance: Normal appearance.  Eyes:     Extraocular Movements: Extraocular movements intact.  Pulmonary:     Effort: Pulmonary effort is normal.  Abdominal:     General: Abdomen is flat. Bowel sounds are normal.     Palpations: Abdomen is soft.     Tenderness: There is abdominal tenderness in the suprapubic area.  Neurological:     Mental Status: She is alert and oriented to person, place, and time. Mental status is at baseline.      UC Treatments / Results  Labs (all labs ordered are listed, but only abnormal results are displayed) Labs Reviewed  POCT URINALYSIS DIP (MANUAL ENTRY) - Abnormal; Notable for the following  components:      Result Value   Blood, UA trace-lysed (*)    All other components within normal limits  URINE CULTURE    EKG   Radiology No results found.  Procedures Procedures (including critical care time)  Medications Ordered in UC Medications - No data to display  Initial Impression / Assessment and Plan / UC Course  I have reviewed the triage vital signs and the nursing notes.  Pertinent labs & imaging results that were available during my care of the patient were reviewed by me and considered in my medical decision making (see chart for details).  Urinary frequency, suprapubic pressure  Urinalysis negative, sent for culture as patient is symptomatic, declined rule out of yeast and BV, denies all vaginal symptoms, advised to monitor and discussed and given written handout of supportive care, advised that if culturing is negative and symptoms continue to persist she will need to follow-up with her primary doctor or urologist for further evaluation, verbalized understanding, may follow-up with urgent care as needed Final Clinical Impressions(s) / UC Diagnoses   Final diagnoses:  Urinary frequency  Suprapubic pressure     Discharge Instructions      Your urinalysis does not show any signs of infection, your urine will be sent to the lab to determine exactly which bacteria is present, if any changes need to be made to your medications you will be notified  You may use over-the-counter Pyridium to help minimize your symptoms, this medication will turn your urine orange  Increase your fluid intake through use of water  As always practice good hygiene, wiping front to back and avoidance of scented vaginal products to prevent further irritation  If symptoms continue to persist and urine culture results are negative , please follow-up with urologist or your primary doctor as needed    ED Prescriptions   None    PDMP not reviewed this encounter.   Valinda Hoar, NP 04/01/23 1225

## 2023-04-02 LAB — URINE CULTURE: Culture: NO GROWTH

## 2023-04-03 ENCOUNTER — Ambulatory Visit: Payer: Medicare Other | Admitting: Primary Care

## 2023-04-09 NOTE — Telephone Encounter (Signed)
Patient needs office visit.  

## 2023-04-16 ENCOUNTER — Other Ambulatory Visit: Payer: Self-pay | Admitting: Primary Care

## 2023-04-16 DIAGNOSIS — F418 Other specified anxiety disorders: Secondary | ICD-10-CM

## 2023-05-07 ENCOUNTER — Encounter: Payer: Medicare Other | Admitting: Dermatology

## 2023-05-12 ENCOUNTER — Encounter: Payer: Medicare Other | Admitting: Dermatology

## 2023-06-02 ENCOUNTER — Ambulatory Visit (INDEPENDENT_AMBULATORY_CARE_PROVIDER_SITE_OTHER): Payer: Medicare Other | Admitting: Gastroenterology

## 2023-06-02 ENCOUNTER — Encounter: Payer: Self-pay | Admitting: Gastroenterology

## 2023-06-02 VITALS — BP 147/82 | HR 73 | Temp 98.6°F | Ht 62.0 in | Wt 158.4 lb

## 2023-06-02 DIAGNOSIS — Z8619 Personal history of other infectious and parasitic diseases: Secondary | ICD-10-CM | POA: Diagnosis not present

## 2023-06-02 DIAGNOSIS — Z8719 Personal history of other diseases of the digestive system: Secondary | ICD-10-CM

## 2023-06-02 DIAGNOSIS — K580 Irritable bowel syndrome with diarrhea: Secondary | ICD-10-CM

## 2023-06-02 DIAGNOSIS — Z09 Encounter for follow-up examination after completed treatment for conditions other than malignant neoplasm: Secondary | ICD-10-CM | POA: Diagnosis not present

## 2023-06-02 NOTE — Progress Notes (Signed)
Arlyss Repress, MD [address]  [address]  [place], [place] [address]  Main: [phone]  Fax: [phone]    Gastroenterology Consultation  Referring Provider:     Doreene Nest, NP Primary Care Physician:  Doreene Nest, NP Primary Gastroenterologist:  Dr. Arlyss Repress Reason for Consultation: History of recurrent C. difficile infection       HPI:   Amy Wilcox is a 72 y.o. Caucasian female with metabolic syndrome referred by Doreene Nest, NP  for consultation & management of chronic diarrhea.  Patient had history of diarrhea predominant IBS, fatty liver.  She underwent workup in the past, however microscopic colitis was not ruled out.  When she had a flareup of her IBS symptoms, she was found to have C. difficile infection.  She had recurrent C. 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  First episode in 1/23.  She completed treatment for first recurrence, her diarrhea has resolved. Patient underwent FMT in April 2023.  Since then, she noticed significant improvement in her diarrhea.  She has very seldom episodes of diarrhea, usually attributes to certain foods.  She had a flareup in early April 2024, stool studies were negative for infection.  She would like to know if she can go back to eating certain foods which she stopped such as red meat.  Most of the fruits also result in diarrhea such as apples, oranges melons etc.  She can tolerate berries fairly well.  Her weight has been stable.  LFTs have been normal. She does not have any other concerns today.   NSAIDs: None  Antiplts/Anticoagulants/Anti thrombotics: None  GI Procedures: Colonoscopy in 2009 in [place], normal, biopsies were not performed EGD and colonoscopy 09/27/2019 - Normal duodenal bulb and second portion of the duodenum. - Erosive gastropathy with stigmata of recent bleeding. Biopsied. - Esophagogastric landmarks identified. - Normal gastroesophageal junction and esophagus.  -  The examined portion of the ileum was normal. - The entire examined colon is normal. - The distal rectum and anal verge are normal on retroflexion view. - No specimens collected.  DIAGNOSIS:  A. 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  - NEGATIVE FOR H. PYLORI BY IMMUNOHISTOCHEMISTRY (IHC).  - NEGATIVE FOR INTESTINAL METAPLASIA, ATROPHY, DYSPLASIA, AND  MALIGNANCY.   Comment:  There is a mild superficial lymphoplasmacytic inflammatory infiltrate,  more prominent in the antral samples.  No active inflammation is  identified, and IHC is negative for H. pylori bacteria.    She denies family history of GI malignancy, inflammatory bowel disease or celiac disease  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/2020   Essential hypertension    Fibrocystic breast    Hyperlipidemia    IBS (irritable bowel syndrome)    Idiopathic hematuria    OSA (obstructive sleep apnea)     Past Surgical History:  Procedure Laterality Date   COLONOSCOPY WITH PROPOFOL N/A 09/27/2019   Procedure: COLONOSCOPY WITH PROPOFOL;  Surgeon: Toney Reil, MD;  Location: [facility] ENDOSCOPY;  Service: Gastroenterology;  Laterality: N/A;   ESOPHAGOGASTRODUODENOSCOPY (EGD) WITH PROPOFOL N/A 09/27/2019   Procedure: ESOPHAGOGASTRODUODENOSCOPY (EGD) WITH PROPOFOL;  Surgeon: Toney Reil, MD;  Location: [facility] ENDOSCOPY;  Service: Gastroenterology;  Laterality: N/A;   TONSILLECTOMY AND ADENOIDECTOMY  1961    Current Outpatient Medications:    aspirin EC 81 MG tablet, Take 81 mg by mouth daily., Disp: , Rfl:    Blood  Glucose Monitoring Suppl DEVI, 1 each by Does not apply route in the morning, at noon, and at bedtime. May substitute to any manufacturer covered by patient's insurance., Disp: 1 each, Rfl: 0   Cholecalciferol (VITAMIN D3) 50 MCG (2000 UT) TABS, 1 tablet, Disp: , Rfl:    cyanocobalamin 500 MCG tablet, Take 500 mcg by mouth  daily., Disp: , Rfl:    ezetimibe (ZETIA) 10 MG tablet, TAKE 1 TABLET BY MOUTH EVERY DAY FOR CHOLESTEROL, Disp: 90 tablet, Rfl: 3   Glucose Blood (BLOOD GLUCOSE TEST STRIPS) STRP, 1 each by In Vitro route in the morning, at noon, and at bedtime. May substitute to any manufacturer covered by patient's insurance., Disp: 100 strip, Rfl: 0   hydrOXYzine (ATARAX) 10 MG tablet, TAKE 1 TABLET (10 MG TOTAL) BY MOUTH TWICE A DAY AS NEEDED FOR ANXIETY, Disp: 180 tablet, Rfl: 0   loperamide (IMODIUM) 2 MG capsule, Taking 1/2 capsule by mouth at bedtime as needed, Disp: , Rfl:    metoprolol succinate (TOPROL-XL) 25 MG 24 hr tablet, Take 1 tablet (25 mg total) by mouth daily., Disp: 90 tablet, Rfl: 1   Omega-3 Fatty Acids (FISH OIL PO), Take 1,200 mg by mouth 2 (two) times daily., Disp: , Rfl:    Oyster Shell Calcium 500 MG TABS, , Disp: , Rfl:    valsartan-hydrochlorothiazide (DIOVAN-HCT) 80-12.5 MG tablet, TAKE 1 TABLET BY MOUTH EVERY DAY FOR BLOOD PRESSURE, Disp: 90 tablet, Rfl: 3    Family History  Problem Relation Age of Onset   Hypertension Mother    Hyperlipidemia Mother    Alzheimer's disease Mother    Heart disease Father    Heart attack Father    Hyperlipidemia Father    Hypertension Father    Breast cancer Neg Hx      Social History   Tobacco Use   Smoking status: Never   Smokeless tobacco: Never  Vaping Use   Vaping status: Never Used  Substance Use Topics   Alcohol use: No   Drug use: No    Allergies as of 06/02/2023 - Review Complete 06/02/2023  Allergen Reaction Noted   Livalo [pitavastatin]  02/20/2017   Statins  02/20/2017    Review of Systems:    All systems reviewed and negative except where noted in HPI.   Physical Exam:  BP (!) 147/82 (BP Location: Left Arm, Patient Position: Sitting, Cuff Size: Normal)   Pulse 73   Temp 98.6 F (37 C) (Oral)   Ht 5\' 2"  (1.575 m)   Wt 158 lb 6 oz (71.8 kg)   BMI 28.97 kg/m  No LMP recorded. Patient is  postmenopausal.  General:   Alert,  Well-developed, well-nourished, pleasant and cooperative in NAD Head:  Normocephalic and atraumatic. Eyes:  Sclera clear, no icterus.   Conjunctiva pink. Ears:  Normal auditory acuity. Nose:  No deformity, discharge, or lesions. Mouth:  No deformity or lesions,oropharynx pink & moist. Neck:  Supple; no masses or thyromegaly. Lungs:  Respirations even and unlabored.  Clear throughout to auscultation.   No wheezes, crackles, or rhonchi. No acute distress. Heart:  Regular rate and rhythm; no murmurs, clicks, rubs, or gallops. Abdomen:  Normal bowel sounds. Soft, non-tender and nondistended without masses, hepatosplenomegaly or hernias noted.  No guarding or rebound tenderness.   Rectal: Not performed Msk:  Symmetrical without gross deformities. Good, equal movement & strength bilaterally. Pulses:  Normal pulses noted. Extremities:  No clubbing or edema.  No cyanosis. Neurologic:  Alert and oriented  x3;  grossly normal neurologically. Skin:  Intact without significant lesions or rashes. No jaundice. Psych:  Alert and cooperative. Normal mood and affect.  Imaging Studies: No abdominal imaging available  Assessment and Plan:   Braydee Dosch is a 72 y.o.  White female with metabolic syndrome, Elita Boone fibrosis, chronic intermittent diarrhea, history of C. difficile infection in 1/23, norovirus in 8/21, 6/21, E. coli in 6/21, 4/21.  She is treated for C. difficile infection with 10 days course of oral vancomycin.  She had recurrence of diarrhea in 09/2021, s/p treatment with oral vancomycin.  S/p FMT through [place] in early April 2023.  Diarrhea has resolved.  She has occasional diarrheal episodes, about once a month or so usually triggered after certain foods which is self-limited.  Discussed about trial of probiotics.  Also, reiterated to continue healthy lifestyle, avoid processed foods.   Follow up as needed   Arlyss Repress, MD

## 2023-06-09 ENCOUNTER — Encounter: Payer: Self-pay | Admitting: Primary Care

## 2023-06-09 NOTE — Telephone Encounter (Signed)
 Care team updated and letter sent for eye exam notes.

## 2023-07-10 ENCOUNTER — Other Ambulatory Visit: Payer: Self-pay | Admitting: Primary Care

## 2023-07-10 DIAGNOSIS — E1165 Type 2 diabetes mellitus with hyperglycemia: Secondary | ICD-10-CM

## 2023-07-14 DIAGNOSIS — M25562 Pain in left knee: Secondary | ICD-10-CM | POA: Diagnosis not present

## 2023-07-16 ENCOUNTER — Ambulatory Visit
Admission: RE | Admit: 2023-07-16 | Discharge: 2023-07-16 | Disposition: A | Discharge status: Home / Self Care | Payer: Medicare Other | Source: Ambulatory Visit | Attending: Primary Care | Admitting: Primary Care

## 2023-07-16 DIAGNOSIS — Z1231 Encounter for screening mammogram for malignant neoplasm of breast: Secondary | ICD-10-CM | POA: Insufficient documentation

## 2023-08-01 DIAGNOSIS — H2513 Age-related nuclear cataract, bilateral: Secondary | ICD-10-CM | POA: Diagnosis not present

## 2023-08-01 DIAGNOSIS — H524 Presbyopia: Secondary | ICD-10-CM | POA: Diagnosis not present

## 2023-08-01 LAB — HM DIABETES EYE EXAM

## 2023-08-21 ENCOUNTER — Ambulatory Visit: Payer: Medicare Other | Admitting: Primary Care

## 2023-08-22 ENCOUNTER — Ambulatory Visit: Payer: Medicare Other | Admitting: Primary Care

## 2023-08-26 ENCOUNTER — Other Ambulatory Visit (INDEPENDENT_AMBULATORY_CARE_PROVIDER_SITE_OTHER): Payer: Medicare Other

## 2023-08-26 DIAGNOSIS — E1165 Type 2 diabetes mellitus with hyperglycemia: Secondary | ICD-10-CM

## 2023-08-26 LAB — POCT GLYCOSYLATED HEMOGLOBIN (HGB A1C): Hemoglobin A1C: 5.7 % — AB (ref 4.0–5.6)

## 2023-08-27 ENCOUNTER — Telehealth (INDEPENDENT_AMBULATORY_CARE_PROVIDER_SITE_OTHER): Payer: Medicare Other | Admitting: Primary Care

## 2023-08-27 ENCOUNTER — Encounter: Payer: Self-pay | Admitting: Primary Care

## 2023-08-27 ENCOUNTER — Other Ambulatory Visit: Payer: Self-pay | Admitting: Primary Care

## 2023-08-27 VITALS — Ht 62.0 in | Wt 149.0 lb

## 2023-08-27 DIAGNOSIS — E1165 Type 2 diabetes mellitus with hyperglycemia: Secondary | ICD-10-CM

## 2023-08-27 NOTE — Assessment & Plan Note (Signed)
 Improved with A1C of 5.7 today! Commended her on this success!!!  Continue to work on diet and exercise. Remain off treatment.  Follow up in 6 months.

## 2023-08-27 NOTE — Progress Notes (Signed)
 Patient ID: Amy Wilcox, female    DOB: [birth year], 73 y.o.   MRN: [id number]  Virtual visit completed through [employer], a video enabled telemedicine application. Due to national recommendations of social distancing due to COVID-19, a virtual visit is felt to be most appropriate for this patient at this time. Reviewed limitations, risks, security and privacy concerns of performing a virtual visit and the availability of in person appointments. I also reviewed that there may be a patient responsible charge related to this service. The patient agreed to proceed.   Patient location: home Provider location: [place] at [facility], office Persons participating in this virtual visit: patient, provider   If any vitals were documented, they were collected by patient at home unless specified below.    Ht 5\' 2"  (1.575 m) Comment: Per patient  Wt 149 lb (67.6 kg) Comment: Per patient  BMI 27.25 kg/m    CC: Follow up for Diabetes Subjective:   HPI: Amy Wilcox is a 73 y.o. female with a history of type 2 diabetes, hypertension, OSA, NAFLD, GERD  presenting on 08/27/2023 for Medical Management of Chronic Issues (Diabetes f/u /Patient has no other concerns)   Current medications include: None  She is checking her blood glucose 2 times daily and is getting readings of:  AM fasting: 120s-130s Bedtime: low 100s  Last A1C: 6.4 in August 2024, 5.7 in February 2025 Last Eye Exam: UTD Last Foot Exam: UTD Pneumonia Vaccination: 2019 Urine Microalbumin: UTD Statin: None. Intolerance  Dietary changes since last visit: Since her last visit she's been limiting carbs, limiting portion sizes. Increased water intake.    Exercise: Exercise bike a home         Relevant past medical, surgical, family and social history reviewed and updated as indicated. Interim medical history since our last visit reviewed. Allergies and medications reviewed and updated. Outpatient Medications Prior to Visit   Medication Sig Dispense Refill   aspirin EC 81 MG tablet Take 81 mg by mouth daily.     Blood Glucose Monitoring Suppl DEVI 1 each by Does not apply route in the morning, at noon, and at bedtime. May substitute to any manufacturer covered by patient's insurance. 1 each 0   Cholecalciferol (VITAMIN D3) 50 MCG (2000 UT) TABS 1 tablet     cyanocobalamin 500 MCG tablet Take 500 mcg by mouth daily.     ezetimibe (ZETIA) 10 MG tablet TAKE 1 TABLET BY MOUTH EVERY DAY FOR CHOLESTEROL 90 tablet 3   glucose blood (ACCU-CHEK GUIDE TEST) test strip Use to check blood sugars up to 3 times daily. 300 strip 0   hydrOXYzine (ATARAX) 10 MG tablet TAKE 1 TABLET (10 MG TOTAL) BY MOUTH TWICE A DAY AS NEEDED FOR ANXIETY 180 tablet 0   loperamide (IMODIUM) 2 MG capsule Taking 1/2 capsule by mouth at bedtime as needed     metoprolol succinate (TOPROL-XL) 25 MG 24 hr tablet Take 1 tablet (25 mg total) by mouth daily. 90 tablet 1   Omega-3 Fatty Acids (FISH OIL PO) Take 1,200 mg by mouth 2 (two) times daily.     Oyster Shell Calcium 500 MG TABS      valsartan-hydrochlorothiazide (DIOVAN-HCT) 80-12.5 MG tablet TAKE 1 TABLET BY MOUTH EVERY DAY FOR BLOOD PRESSURE 90 tablet 3   No facility-administered medications prior to visit.     Per HPI unless specifically indicated in ROS section below Review of Systems  Eyes:  Negative for visual disturbance.  Respiratory:  Negative for  shortness of breath.   Cardiovascular:  Negative for chest pain.  Neurological:  Negative for dizziness and light-headedness.   Objective:  Ht 5\' 2"  (1.575 m) Comment: Per patient  Wt 149 lb (67.6 kg) Comment: Per patient  BMI 27.25 kg/m   Wt Readings from Last 3 Encounters:  08/27/23 149 lb (67.6 kg)  06/02/23 158 lb 6 oz (71.8 kg)  02/18/23 157 lb (71.2 kg)       Physical exam: General: Alert and oriented x 3, no distress, does not appear sickly  Pulmonary: Speaks in complete sentences without increased work of breathing, no  cough during visit.  Psychiatric: Normal mood, thought content, and behavior.     Results for orders placed or performed in visit on 08/26/23  POCT glycosylated hemoglobin (Hb A1C)   Collection Time: 08/26/23 10:43 AM  Result Value Ref Range   Hemoglobin A1C 5.7 (A) 4.0 - 5.6 %   HbA1c POC (<> result, manual entry)     HbA1c, POC (prediabetic range)     HbA1c, POC (controlled diabetic range)     Assessment & Plan:   Problem List Items Addressed This Visit       Endocrine   Type 2 diabetes mellitus with hyperglycemia (HCC) - Primary   Improved with A1C of 5.7 today! Commended her on this success!!!  Continue to work on diet and exercise. Remain off treatment.  Follow up in 6 months.        No orders of the defined types were placed in this encounter.  No orders of the defined types were placed in this encounter.   I discussed the assessment and treatment plan with the patient. The patient was provided an opportunity to ask questions and all were answered. The patient agreed with the plan and demonstrated an understanding of the instructions. The patient was advised to call back or seek an in-person evaluation if the symptoms worsen or if the condition fails to improve as anticipated.  Follow up plan:  Continue to work on your diet and regular exercise!  Keep up the great work!  Schedule your annual visit for mid August 2025.  Doreene Nest, NP

## 2023-08-27 NOTE — Patient Instructions (Signed)
 Continue to work on your diet and regular exercise!  Keep up the great work!  Schedule your annual visit for mid August 2025.

## 2023-09-08 DIAGNOSIS — M545 Low back pain, unspecified: Secondary | ICD-10-CM | POA: Diagnosis not present

## 2023-09-08 DIAGNOSIS — M5416 Radiculopathy, lumbar region: Secondary | ICD-10-CM | POA: Diagnosis not present

## 2023-09-18 DIAGNOSIS — M5451 Vertebrogenic low back pain: Secondary | ICD-10-CM | POA: Diagnosis not present

## 2023-09-22 DIAGNOSIS — G4733 Obstructive sleep apnea (adult) (pediatric): Secondary | ICD-10-CM | POA: Diagnosis not present

## 2023-09-23 DIAGNOSIS — M5451 Vertebrogenic low back pain: Secondary | ICD-10-CM | POA: Diagnosis not present

## 2023-10-02 DIAGNOSIS — M5451 Vertebrogenic low back pain: Secondary | ICD-10-CM | POA: Diagnosis not present

## 2023-10-02 DIAGNOSIS — M5416 Radiculopathy, lumbar region: Secondary | ICD-10-CM | POA: Diagnosis not present

## 2023-10-02 DIAGNOSIS — G4733 Obstructive sleep apnea (adult) (pediatric): Secondary | ICD-10-CM | POA: Diagnosis not present

## 2023-10-09 DIAGNOSIS — M5451 Vertebrogenic low back pain: Secondary | ICD-10-CM | POA: Diagnosis not present

## 2023-10-09 DIAGNOSIS — M5416 Radiculopathy, lumbar region: Secondary | ICD-10-CM | POA: Diagnosis not present

## 2023-10-12 ENCOUNTER — Other Ambulatory Visit: Payer: Self-pay | Admitting: Primary Care

## 2023-10-12 DIAGNOSIS — I10 Essential (primary) hypertension: Secondary | ICD-10-CM

## 2023-10-12 DIAGNOSIS — E785 Hyperlipidemia, unspecified: Secondary | ICD-10-CM

## 2023-10-23 DIAGNOSIS — M5416 Radiculopathy, lumbar region: Secondary | ICD-10-CM | POA: Diagnosis not present

## 2023-10-23 DIAGNOSIS — M5451 Vertebrogenic low back pain: Secondary | ICD-10-CM | POA: Diagnosis not present

## 2023-11-17 DIAGNOSIS — M25562 Pain in left knee: Secondary | ICD-10-CM | POA: Diagnosis not present

## 2023-11-17 DIAGNOSIS — M1712 Unilateral primary osteoarthritis, left knee: Secondary | ICD-10-CM | POA: Diagnosis not present

## 2023-12-07 ENCOUNTER — Other Ambulatory Visit: Payer: Self-pay | Admitting: Primary Care

## 2023-12-07 DIAGNOSIS — E1165 Type 2 diabetes mellitus with hyperglycemia: Secondary | ICD-10-CM

## 2023-12-08 DIAGNOSIS — G4733 Obstructive sleep apnea (adult) (pediatric): Secondary | ICD-10-CM | POA: Diagnosis not present

## 2023-12-10 DIAGNOSIS — M1712 Unilateral primary osteoarthritis, left knee: Secondary | ICD-10-CM | POA: Diagnosis not present

## 2023-12-10 DIAGNOSIS — M25562 Pain in left knee: Secondary | ICD-10-CM | POA: Diagnosis not present

## 2024-01-08 ENCOUNTER — Other Ambulatory Visit: Payer: Self-pay | Admitting: Primary Care

## 2024-01-08 DIAGNOSIS — I10 Essential (primary) hypertension: Secondary | ICD-10-CM

## 2024-01-08 DIAGNOSIS — E785 Hyperlipidemia, unspecified: Secondary | ICD-10-CM

## 2024-01-13 DIAGNOSIS — M25562 Pain in left knee: Secondary | ICD-10-CM | POA: Diagnosis not present

## 2024-01-13 DIAGNOSIS — G8929 Other chronic pain: Secondary | ICD-10-CM | POA: Diagnosis not present

## 2024-01-13 DIAGNOSIS — M1712 Unilateral primary osteoarthritis, left knee: Secondary | ICD-10-CM | POA: Diagnosis not present

## 2024-01-28 DIAGNOSIS — M1712 Unilateral primary osteoarthritis, left knee: Secondary | ICD-10-CM | POA: Diagnosis not present

## 2024-01-28 DIAGNOSIS — M25562 Pain in left knee: Secondary | ICD-10-CM | POA: Diagnosis not present

## 2024-01-28 DIAGNOSIS — G8929 Other chronic pain: Secondary | ICD-10-CM | POA: Diagnosis not present

## 2024-02-02 DIAGNOSIS — M1712 Unilateral primary osteoarthritis, left knee: Secondary | ICD-10-CM | POA: Diagnosis not present

## 2024-02-02 DIAGNOSIS — G8929 Other chronic pain: Secondary | ICD-10-CM | POA: Diagnosis not present

## 2024-02-02 DIAGNOSIS — M25562 Pain in left knee: Secondary | ICD-10-CM | POA: Diagnosis not present

## 2024-02-04 ENCOUNTER — Encounter: Payer: Self-pay | Admitting: Gastroenterology

## 2024-02-08 ENCOUNTER — Other Ambulatory Visit: Payer: Self-pay | Admitting: Primary Care

## 2024-02-08 DIAGNOSIS — E1165 Type 2 diabetes mellitus with hyperglycemia: Secondary | ICD-10-CM

## 2024-02-09 ENCOUNTER — Other Ambulatory Visit: Payer: Self-pay | Admitting: Primary Care

## 2024-02-09 DIAGNOSIS — M1712 Unilateral primary osteoarthritis, left knee: Secondary | ICD-10-CM | POA: Diagnosis not present

## 2024-02-09 DIAGNOSIS — M25562 Pain in left knee: Secondary | ICD-10-CM | POA: Diagnosis not present

## 2024-02-09 DIAGNOSIS — E1165 Type 2 diabetes mellitus with hyperglycemia: Secondary | ICD-10-CM

## 2024-02-09 DIAGNOSIS — G8929 Other chronic pain: Secondary | ICD-10-CM | POA: Diagnosis not present

## 2024-02-18 ENCOUNTER — Ambulatory Visit: Payer: Medicare Other

## 2024-02-18 VITALS — Ht 62.0 in | Wt 149.0 lb

## 2024-02-18 DIAGNOSIS — Z0000 Encounter for general adult medical examination without abnormal findings: Secondary | ICD-10-CM

## 2024-02-18 NOTE — Patient Instructions (Addendum)
 Amy Wilcox , Thank you for taking time out of your busy schedule to complete your Annual Wellness Visit with me. I enjoyed our conversation and look forward to speaking with you again next year. I, as well as your care team,  appreciate your ongoing commitment to your health goals. Please review the following plan we discussed and let me know if I can assist you in the future. Your Game plan/ To Do List    Referrals: If you haven't heard from the office you've been referred to, please reach out to them at the phone provided.   Follow up Visits: We will see or speak with you next year for your Next Medicare AWV with our clinical staff-02/18/25 @ 2:20pm televisit Have you seen your provider in the last 6 months (3 months if uncontrolled diabetes)? Yes  Clinician Recommendations:  Aim for 30 minutes of exercise or brisk walking, 6-8 glasses of water, and 5 servings of fruits and vegetables each day.       This is a list of the screenings recommended for you:  Health Maintenance  Topic Date Due   Yearly kidney health urinalysis for diabetes  Never done   Zoster (Shingles) Vaccine (1 of 2) Never done   COVID-19 Vaccine (3 - Pfizer risk series) 11/08/2019   Yearly kidney function blood test for diabetes  10/01/2023   Flu Shot  02/06/2024   Complete foot exam   02/18/2024   Hemoglobin A1C  02/23/2024   Eye exam for diabetics  07/31/2024   Medicare Annual Wellness Visit  02/17/2025   Mammogram  07/15/2025   Colon Cancer Screening  09/26/2029   Pneumococcal Vaccine for age over 36  Completed   DEXA scan (bone density measurement)  Completed   Hepatitis C Screening  Completed   Hepatitis B Vaccine  Aged Out   HPV Vaccine  Aged Out   Meningitis B Vaccine  Aged Out   DTaP/Tdap/Td vaccine  Discontinued    Advanced directives: (Copy Requested) Please bring a copy of your health care power of attorney and living will to the office to be added to your chart at your convenience. You can mail to [facility] [address] [place], [place] [address] or email to [email or web address] Advance Care Planning is important because it:  [x]  Makes sure you receive the medical care that is consistent with your values, goals, and preferences  [x]  It provides guidance to your family and loved ones and reduces their decisional burden about whether or not they are making the right decisions based on your wishes.  Follow the link provided in your after visit summary or read over the paperwork we have mailed to you to help you started getting your Advance Directives in place. If you need assistance in completing these, please reach out to us  so that we can help you!

## 2024-02-18 NOTE — Progress Notes (Signed)
 Subjective:   Amy Wilcox is a 73 y.o. who presents for a Medicare Wellness preventive visit.  As a reminder, Annual Wellness Visits don't include a physical exam, and some assessments may be limited, especially if this visit is performed virtually. We may recommend an in-person follow-up visit with your provider if needed.  Visit Complete: Virtual I connected with  [name] on 02/18/24 by a audio enabled telemedicine application and verified that I am speaking with the correct person using two identifiers.  Patient Location: Home  Provider Location: Office/Clinic  I discussed the limitations of evaluation and management by telemedicine. The patient expressed understanding and agreed to proceed.  Vital Signs: Because this visit was a virtual/telehealth visit, some criteria may be missing or patient reported. Any vitals not documented were not able to be obtained and vitals that have been documented are patient reported.  VideoDeclined- This patient declined Librarian, academic. Therefore the visit was completed with audio only.  Persons Participating in Visit: Patient.  AWV Questionnaire: Yes: Patient Medicare AWV questionnaire was completed by the patient on 02/17/24; I have confirmed that all information answered by patient is correct and no changes since this date.  Cardiac Risk Factors include: advanced age (>36men, >19 women);diabetes mellitus;dyslipidemia;hypertension;sedentary lifestyle     Objective:    Today's Vitals   02/18/24 1420  Weight: 149 lb (67.6 kg)  Height: 5' 2 (1.575 m)   Body mass index is 27.25 kg/m.     02/18/2024    2:31 PM 04/01/2023   12:02 PM 02/12/2023    1:49 PM 01/31/2022    9:45 AM 11/19/2021    1:09 PM 01/30/2021   10:26 AM 01/28/2020   10:29 AM  Advanced Directives  Does Patient Have a Medical Advance Directive? Yes No Yes Yes No Yes Yes  Type of Estate agent of [place];Living will   Healthcare Power of [place];Living will Healthcare Power of [place];Living will  Healthcare Power of [place];Living will Healthcare Power of [place];Living will  Copy of Healthcare Power of Attorney in Chart? No - copy requested  No - copy requested No - copy requested  No - copy requested No - copy requested  Would patient like information on creating a medical advance directive?     No - Patient declined      Current Medications (verified) Outpatient Encounter Medications as of 02/18/2024  Medication Sig   ACCU-CHEK GUIDE TEST test strip USE TO CHECK BLOOD SUGARS UP TO 3 TIMES DAILY.   Accu-Chek Softclix Lancets lancets Test blood sugar once daily   aspirin EC 81 MG tablet Take 81 mg by mouth daily.   Blood Glucose Monitoring Suppl DEVI 1 each by Does not apply route in the morning, at noon, and at bedtime. May substitute to any manufacturer covered by patient's insurance.   Cholecalciferol (VITAMIN D3) 50 MCG (2000 UT) TABS 1 tablet   cyanocobalamin 500 MCG tablet Take 500 mcg by mouth daily.   ezetimibe  (ZETIA ) 10 MG tablet TAKE 1 TABLET BY MOUTH EVERY DAY FOR CHOLESTEROL   loperamide (IMODIUM) 2 MG capsule Taking 1/2 capsule by mouth at bedtime as needed   metoprolol  succinate (TOPROL -XL) 25 MG 24 hr tablet Take 1 tablet (25 mg total) by mouth daily. for blood pressure.   Omega-3 Fatty Acids (FISH OIL PO) Take 1,200 mg by mouth 2 (two) times daily.   Oyster Shell Calcium 500 MG TABS    valsartan -hydrochlorothiazide  (DIOVAN -HCT) 80-12.5 MG tablet TAKE 1 TABLET BY  MOUTH EVERY DAY FOR BLOOD PRESSURE   hydrOXYzine  (ATARAX ) 10 MG tablet TAKE 1 TABLET (10 MG TOTAL) BY MOUTH TWICE A DAY AS NEEDED FOR ANXIETY   No facility-administered encounter medications on file as of 02/18/2024.    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   Cataract 2015   Essential  hypertension    Fibrocystic breast    Hyperlipidemia    IBS (irritable bowel syndrome)    Idiopathic hematuria    OSA (obstructive sleep apnea)    Sleep apnea 2003   CPAP   Past Surgical History:  Procedure Laterality Date   COLONOSCOPY WITH PROPOFOL  N/A 09/27/2019   Procedure: COLONOSCOPY WITH PROPOFOL ;  Surgeon: Unk Corinn Skiff, MD;  Location: [facility] END[facility]COPY;  Service: Gastroenterology;  Laterality: N/A;   ESOPHAGOGASTRODUODENOSCOPY (EGD) WITH PROPOFOL  N/A 09/27/2019   Procedure: ESOPHAGOGASTRODUODENOSCOPY (EGD) WITH PROPOFOL ;  Surgeon: Unk Corinn Skiff, MD;  Location: [facility] ENDOSCOPY;  Service: Gastroenterology;  Laterality: N/A;   TONSILLECTOMY AND ADENOIDECTOMY  1961   Family History  Problem Relation Age of Onset   Hypertension Mother    Hyperlipidemia Mother    Alzheimer's disease Mother    Heart disease Father    Heart attack Father    Hyperlipidemia Father    Hypertension Father    Anxiety disorder Daughter    Depression Daughter    Diabetes Daughter    Birth defects Brother    Diabetes Brother    Intellectual disability Son    Breast cancer Neg Hx    Social History   Socioeconomic History   Marital status: Married    Spouse name: Not on file   Number of children: Not on file   Years of education: Not on file   Highest education level: 12th grade  Occupational History   Occupation: retired  Tobacco Use   Smoking status: Never   Smokeless tobacco: Never  Vaping Use   Vaping status: Never Used  Substance and Sexual Activity   Alcohol use: No   Drug use: No   Sexual activity: Yes  Other Topics Concern   Not on file  Social History Narrative   Married.   3 children.   Retired. Once worked a Futures trader.   Enjoys spending time with family.   Social Drivers of Corporate investment banker Strain: Low Risk  (02/17/2024)   Overall Financial Resource Strain (CARDIA)    Difficulty of Paying Living Expenses: Not hard at all  Food Insecurity: No Food  Insecurity (02/17/2024)   Hunger Vital Sign    Worried About Running Out of Food in the Last Year: Never true    Ran Out of Food in the Last Year: Never true  Transportation Needs: No Transportation Needs (02/17/2024)   PRAPARE - Administrator, Civil Service (Medical): No    Lack of Transportation (Non-Medical): No  Physical Activity: Insufficiently Active (02/17/2024)   Exercise Vital Sign    Days of Exercise per Week: 1 day    Minutes of Exercise per Session: 40 min  Stress: No Stress Concern Present (02/17/2024)   [employer] of Occupational Health - Occupational Stress Questionnaire    Feeling of Stress: Only a little  Social Connections: Socially Integrated (02/17/2024)   Social Connection and Isolation Panel    Frequency of Communication with Friends and Family: More than three times a week  Frequency of Social Gatherings with Friends and Family: Twice a week    Attends Religious Services: More than 4 times per year    Active Member of [employer] or Organizations: Yes    Attends Engineer, structural: More than 4 times per year    Marital Status: Married    Tobacco Counseling Counseling given: Not Answered   Clinical Intake:  Pre-visit preparation completed: Yes  Pain : No/denies pain     BMI - recorded: 27.25 Nutritional Status: BMI 25 -29 Overweight Nutritional Risks: None Diabetes: Yes CBG done?: No Did pt. bring in CBG monitor from home?: No  Lab Results  Component Value Date   HGBA1C 5.7 (A) 08/26/2023   HGBA1C 6.4 (A) 02/18/2023   HGBA1C 6.5 10/01/2022     How often do you need to have someone help you when you read instructions, pamphlets, or other written materials from your doctor or pharmacy?: 1 - Never  Interpreter Needed?: No  Comments: lives with husband and son Information entered by :: B.Delmont Prosch,LPN   Activities of Daily Living     02/17/2024    3:14 PM  In your present state of health, do you have any difficulty  performing the following activities:  Hearing? 0  Vision? 0  Difficulty concentrating or making decisions? 0  Walking or climbing stairs? 0  Dressing or bathing? 0  Doing errands, shopping? 0  Preparing Food and eating ? N  Using the Toilet? N  In the past six months, have you accidently leaked urine? N  Do you have problems with loss of bowel control? N  Managing your Medications? N  Managing your Finances? N  Housekeeping or managing your Housekeeping? N    Patient Care Team: Gretta Comer POUR, NP as PCP - General (Internal Medicine) [facility], [facility] (Ophthalmology) Waylan Cain, MD as Consulting Physician (Ophthalmology)  I have updated your Care Teams any recent Medical Services you may have received from other providers in the past year.     Assessment:   This is a routine wellness examination for Amy Wilcox.  Hearing/Vision screen Hearing Screening - Comments:: Pt says her hearing is good Vision Screening - Comments:: Pt says her vision is good w/glasses Dr B Waylan   Goals Addressed             This Visit's Progress    COMPLETED: DIET - INCREASE WATER INTAKE       Starting 09/22/2017, I will attempt to drink at least 3 glasses of water daily. Goal is 6-8 glasses of water daily.      Patient Stated   On track    02/18/24- I will maintain and continue medications as prescribed.     COMPLETED: Patient Stated       01/31/2022, no goals     Patient Stated       02/18/24-Continue to be active.     COMPLETED: Weight (lb) < 200 lb (90.7 kg)   149 lb (67.6 kg)    Wants to lose at least 10 pounds       Depression Screen     02/18/2024    2:28 PM 08/27/2023    7:55 AM 08/27/2023    7:54 AM 02/12/2023    1:48 PM 10/01/2022    7:43 AM 01/31/2022    9:46 AM 08/21/2021    8:08 AM  PHQ 2/9 Scores  PHQ - 2 Score 0 0 0 0 0 0 0  PHQ- 9 Score  0  4  0    Fall Risk     02/17/2024    3:14 PM 08/27/2023    7:54 AM 02/12/2023    1:50 PM 02/06/2023    9:30 AM  10/01/2022    7:43 AM  Fall Risk   Falls in the past year? 0 0 0 0 0  Number falls in past yr: 0 0 0 0 0  Injury with Fall? 0 0 0 0 0  Risk for fall due to : No Fall Risks No Fall Risks No Fall Risks  No Fall Risks  Follow up Education provided;Falls prevention discussed Falls evaluation completed Falls prevention discussed;Falls evaluation completed  Falls evaluation completed    MEDICARE RISK AT HOME:  Medicare Risk at Home Any stairs in or around the home?: (Patient-Rptd) Yes If so, are there any without handrails?: (Patient-Rptd) No Home free of loose throw rugs in walkways, pet beds, electrical cords, etc?: (Patient-Rptd) Yes Adequate lighting in your home to reduce risk of falls?: (Patient-Rptd) Yes Life alert?: (Patient-Rptd) No Use of a cane, walker or w/c?: (Patient-Rptd) No Grab bars in the bathroom?: (Patient-Rptd) Yes Shower chair or bench in shower?: (Patient-Rptd) No Elevated toilet seat or a handicapped toilet?: (Patient-Rptd) No  TIMED UP AND GO:  Was the test performed?  No  Cognitive Function: 6CIT completed    01/30/2021   10:32 AM 01/28/2020   10:31 AM 01/25/2019   11:14 AM 09/22/2017    8:15 AM  MMSE - Mini Mental State Exam  Orientation to time 5 5 5 5   Orientation to Place 5 5 5 5   Registration 3 3 3 3   Attention/ Calculation 5 5 5  0  Recall 3 3 3 3   Language- name 2 objects   0 0  Language- repeat 1 1 1 1   Language- follow 3 step command   0 3  Language- read & follow direction   0 0  Write a sentence   0 0  Copy design   0 0  Total score   22 20        02/18/2024    2:33 PM 02/12/2023    1:51 PM 01/31/2022    9:48 AM  6CIT Screen  What Year? 0 points 0 points 0 points  What month? 0 points 0 points 0 points  What time? 0 points 0 points 0 points  Count back from 20 0 points 0 points 0 points  Months in reverse 0 points 0 points 0 points  Repeat phrase 0 points 0 points 2 points  Total Score 0 points 0 points 2 points     Immunizations Immunization History  Administered Date(s) Administered   Fluad Quad(high Dose 65+) 05/25/2020   Influenza, High Dose Seasonal PF 04/08/2018, 04/02/2019   Influenza-Unspecified 05/03/2021, 05/03/2021   PFIZER(Purple Top)SARS-COV-2 Vaccination 09/19/2019, 10/11/2019   Pneumococcal Conjugate-13 04/30/2016   Pneumococcal Polysaccharide-23 10/02/2017   Tdap 03/17/2007    Screening Tests Health Maintenance  Topic Date Due   Diabetic kidney evaluation - Urine ACR  Never done   Zoster Vaccines- Shingrix (1 of 2) Never done   COVID-19 Vaccine (3 - Pfizer risk series) 11/08/2019   Diabetic kidney evaluation - eGFR measurement  10/01/2023   INFLUENZA VACCINE  02/06/2024   FOOT EXAM  02/18/2024   HEMOGLOBIN A1C  02/23/2024   OPHTHALMOLOGY EXAM  07/31/2024   Medicare Annual Wellness (AWV)  02/17/2025   MAMMOGRAM  07/15/2025   Colonoscopy  09/26/2029   Pneumococcal Vaccine: 50+ Years  Completed   DEXA SCAN  Completed   Hepatitis C Screening  Completed   Hepatitis B Vaccines  Aged Out   HPV VACCINES  Aged Out   Meningococcal B Vaccine  Aged Out   DTaP/Tdap/Td  Discontinued    Health Maintenance  Health Maintenance Due  Topic Date Due   Diabetic kidney evaluation - Urine ACR  Never done   Zoster Vaccines- Shingrix (1 of 2) Never done   COVID-19 Vaccine (3 - Pfizer risk series) 11/08/2019   Diabetic kidney evaluation - eGFR measurement  10/01/2023   INFLUENZA VACCINE  02/06/2024   FOOT EXAM  02/18/2024   Health Maintenance Items Addressed: None due at this time  Additional Screening:  Vision Screening: Recommended annual ophthalmology exams for early detection of glaucoma and other disorders of the eye. Would you like a referral to an eye doctor? No    Dental Screening: Recommended annual dental exams for proper oral hygiene  Community Resource Referral / Chronic Care Management: CRR required this visit?  No   CCM required this visit?  No   Plan:     I have personally reviewed and noted the following in the patient's chart:   Medical and social history Use of alcohol, tobacco or illicit drugs  Current medications and supplements including opioid prescriptions. Patient is not currently taking opioid prescriptions. Functional ability and status Nutritional status Physical activity Advanced directives List of other physicians Hospitalizations, surgeries, and ER visits in previous 12 months Vitals Screenings to include cognitive, depression, and falls Referrals and appointments  In addition, I have reviewed and discussed with patient certain preventive protocols, quality metrics, and best practice recommendations. A written personalized care plan for preventive services as well as general preventive health recommendations were provided to patient.   Erminio LITTIE Saris, LPN   1/[id number]/7974   After Visit Summary: (MyChart) Due to this being a telephonic visit, the after visit summary with patients personalized plan was offered to patient via MyChart   Notes: Pt relays she has concerns with lft knee pain (says bone on bone per ortho MD) and says she did not think she was diabetic but pre-diabetic

## 2024-02-19 DIAGNOSIS — R197 Diarrhea, unspecified: Secondary | ICD-10-CM | POA: Diagnosis not present

## 2024-02-23 DIAGNOSIS — M25562 Pain in left knee: Secondary | ICD-10-CM | POA: Diagnosis not present

## 2024-02-23 DIAGNOSIS — M1712 Unilateral primary osteoarthritis, left knee: Secondary | ICD-10-CM | POA: Diagnosis not present

## 2024-02-23 DIAGNOSIS — G8929 Other chronic pain: Secondary | ICD-10-CM | POA: Diagnosis not present

## 2024-02-24 ENCOUNTER — Encounter: Payer: Self-pay | Admitting: Primary Care

## 2024-02-24 ENCOUNTER — Ambulatory Visit (INDEPENDENT_AMBULATORY_CARE_PROVIDER_SITE_OTHER): Admitting: Primary Care

## 2024-02-24 VITALS — BP 144/72 | HR 63 | Temp 97.8°F | Ht 62.0 in | Wt 150.0 lb

## 2024-02-24 DIAGNOSIS — G8929 Other chronic pain: Secondary | ICD-10-CM | POA: Diagnosis not present

## 2024-02-24 DIAGNOSIS — K2100 Gastro-esophageal reflux disease with esophagitis, without bleeding: Secondary | ICD-10-CM

## 2024-02-24 DIAGNOSIS — G4733 Obstructive sleep apnea (adult) (pediatric): Secondary | ICD-10-CM | POA: Diagnosis not present

## 2024-02-24 DIAGNOSIS — E1165 Type 2 diabetes mellitus with hyperglycemia: Secondary | ICD-10-CM | POA: Diagnosis not present

## 2024-02-24 DIAGNOSIS — E2839 Other primary ovarian failure: Secondary | ICD-10-CM | POA: Diagnosis not present

## 2024-02-24 DIAGNOSIS — K580 Irritable bowel syndrome with diarrhea: Secondary | ICD-10-CM

## 2024-02-24 DIAGNOSIS — I10 Essential (primary) hypertension: Secondary | ICD-10-CM | POA: Diagnosis not present

## 2024-02-24 DIAGNOSIS — E785 Hyperlipidemia, unspecified: Secondary | ICD-10-CM

## 2024-02-24 DIAGNOSIS — M25561 Pain in right knee: Secondary | ICD-10-CM | POA: Diagnosis not present

## 2024-02-24 DIAGNOSIS — M1712 Unilateral primary osteoarthritis, left knee: Secondary | ICD-10-CM | POA: Diagnosis not present

## 2024-02-24 DIAGNOSIS — Z1231 Encounter for screening mammogram for malignant neoplasm of breast: Secondary | ICD-10-CM | POA: Diagnosis not present

## 2024-02-24 LAB — CBC
HCT: 41.9 % (ref 36.0–46.0)
Hemoglobin: 14.7 g/dL (ref 12.0–15.0)
MCHC: 35 g/dL (ref 30.0–36.0)
MCV: 87.3 fl (ref 78.0–100.0)
Platelets: 203 K/uL (ref 150.0–400.0)
RBC: 4.8 Mil/uL (ref 3.87–5.11)
RDW: 12.5 % (ref 11.5–15.5)
WBC: 5.7 K/uL (ref 4.0–10.5)

## 2024-02-24 LAB — COMPREHENSIVE METABOLIC PANEL WITH GFR
ALT: 13 U/L (ref 0–35)
AST: 14 U/L (ref 0–37)
Albumin: 4.6 g/dL (ref 3.5–5.2)
Alkaline Phosphatase: 33 U/L — ABNORMAL LOW (ref 39–117)
BUN: 19 mg/dL (ref 6–23)
CO2: 32 meq/L (ref 19–32)
Calcium: 10 mg/dL (ref 8.4–10.5)
Chloride: 100 meq/L (ref 96–112)
Creatinine, Ser: 0.87 mg/dL (ref 0.40–1.20)
GFR: 66.54 mL/min (ref >60.00)
Glucose, Bld: 129 mg/dL — ABNORMAL HIGH (ref 70–99)
Potassium: 4 meq/L (ref 3.5–5.1)
Sodium: 141 meq/L (ref 135–145)
Total Bilirubin: 0.8 mg/dL (ref 0.2–1.2)
Total Protein: 7 g/dL (ref 6.0–8.3)

## 2024-02-24 LAB — MICROALBUMIN / CREATININE URINE RATIO
Creatinine,U: 94.2 mg/dL
Microalb Creat Ratio: 8.6 mg/g (ref 0.0–30.0)
Microalb, Ur: 0.8 mg/dL (ref 0.0–1.9)

## 2024-02-24 LAB — LIPID PANEL
Cholesterol: 232 mg/dL — ABNORMAL HIGH (ref 0–200)
HDL: 44.5 mg/dL (ref >39.00)
LDL Cholesterol: 126 mg/dL — ABNORMAL HIGH (ref 0–99)
NonHDL: 187.19
Total CHOL/HDL Ratio: 5
Triglycerides: 304 mg/dL — ABNORMAL HIGH (ref 0.0–149.0)
VLDL: 60.8 mg/dL — ABNORMAL HIGH (ref 0.0–40.0)

## 2024-02-24 LAB — HEMOGLOBIN A1C: Hgb A1c MFr Bld: 5.8 % (ref 4.6–6.5)

## 2024-02-24 NOTE — Assessment & Plan Note (Signed)
 Following with orthopedics. Continue physical therapy.

## 2024-02-24 NOTE — Patient Instructions (Signed)
 Stop by the lab prior to leaving today. I will notify you of your results once received.   [facility] your mammogram and bone density scan for January 2026.   Please schedule a follow up visit for 6 months for a diabetes check.  It was a pleasure to see you today!

## 2024-02-24 NOTE — Assessment & Plan Note (Signed)
No concerns. Continue to monitor.  

## 2024-02-24 NOTE — Assessment & Plan Note (Addendum)
 Following with GI. Waxes and wanes.   She will be following up soon.

## 2024-02-24 NOTE — Assessment & Plan Note (Signed)
 Repeat lipid panel pending.  Continue Zetia  10 mg daily.  Statin intolerant.

## 2024-02-24 NOTE — Assessment & Plan Note (Signed)
 Repeat A1c pending. Remain off treatment.  Foot exam today. Urine microalbumin done pending.

## 2024-02-24 NOTE — Assessment & Plan Note (Signed)
Continue CPAP nightly. °

## 2024-02-24 NOTE — Progress Notes (Signed)
 Subjective:    Patient ID: Amy Wilcox, female    DOB: [birth year], 73 y.o.   MRN: [id number]   History of Present Illness    Amy Wilcox is a very pleasant 73 y.o. female with a history of hypertension, OSA, GERD, type 2 diabetes, hyperlipidemia, frequent weights who presents today for follow up chronic conditions.  Bone density scan is due in September 2025, mammogram due in January 2026.  She will complete both in January 2026.  Immunizations discussed today, she declines Shingrix vaccines at this time.  1) Hypertension/OSA: Currently managed on valsartan -hydrochlorothiazide  80-12.5 mg daily and metoprolol  succinate 25 mg daily.   She is checking her BP at home which is running 110s-140/60-80s. She denies chest pain, shortness of breath.   She has managed on CPAP for OSA, she is compliant nightly.  BP Readings from Last 3 Encounters:  02/24/24 (!) 144/72  06/02/23 (!) 147/82  04/01/23 (!) 160/82    2) Situational Anxiety: Currently managed on hydroxyzine  10 mg as needed for which she uses sparingly. Overall feels stable. No concerns today.   3) Hyperlipidemia: Currently managed on Zetia  10 mg daily. She is not exercising due to chronic knee pain. She is intolerant to statins.  4) Type 2 Diabetes:  Current medications include: None.   She is checking her blood glucose 1 times daily and is getting readings of:  AM fasting: 120-130s  Last A1C: 5.7 in February 2025 Last Eye Exam: UTD Last Foot Exam:  Due Pneumonia Vaccination: 2019 Urine Microalbumin: Due Statin: Intolerant    Exercise: None due to knee pain      Review of Systems  Respiratory:  Negative for shortness of breath.   Cardiovascular:  Negative for chest pain.  Musculoskeletal:  Positive for arthralgias.  Neurological:  Negative for numbness and headaches.         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   Cataract 2015    Essential hypertension    Fibrocystic breast    Hyperlipidemia    IBS (irritable bowel syndrome)    Idiopathic hematuria    OSA (obstructive sleep apnea)    Sleep apnea 2003   CPAP    Social History   Socioeconomic History   Marital status: Married    Spouse name: Not on file   Number of children: Not on file   Years of education: Not on file   Highest education level: 12th grade  Occupational History   Occupation: retired  Tobacco Use   Smoking status: Never   Smokeless tobacco: Never  Vaping Use   Vaping status: Never Used  Substance and Sexual Activity   Alcohol use: No   Drug use: No   Sexual activity: Yes  Other Topics Concern   Not on file  Social History Narrative   Married.   3 children.   Retired. Once worked a Futures trader.   Enjoys spending time with family.   Social Drivers of Corporate investment banker Strain: Low Risk  (02/17/2024)   Overall Financial Resource Strain (CARDIA)    Difficulty of Paying Living Expenses: Not hard at all  Food Insecurity: No Food Insecurity (02/17/2024)   Hunger Vital Sign    Worried About Running Out of Food in the Last Year: Never true    Ran Out of Food in the Last Year: Never true  Transportation Needs: No Transportation Needs (02/17/2024)   PRAPARE - Transportation  Lack of Transportation (Medical): No    Lack of Transportation (Non-Medical): No  Physical Activity: Insufficiently Active (02/17/2024)   Exercise Vital Sign    Days of Exercise per Week: 1 day    Minutes of Exercise per Session: 40 min  Stress: No Stress Concern Present (02/17/2024)   [employer] of Occupational Health - Occupational Stress Questionnaire    Feeling of Stress: Only a little  Social Connections: Socially Integrated (02/17/2024)   Social Connection and Isolation Panel    Frequency of Communication with Friends and Family: More than three times a week    Frequency of Social Gatherings with Friends and Family: Twice a week    Attends  Religious Services: More than 4 times per year    Active Member of Clubs or Organizations: Yes    Attends Banker Meetings: More than 4 times per year    Marital Status: Married  Catering manager Violence: Not At Risk (02/18/2024)   Humiliation, Afraid, Rape, and Kick questionnaire    Fear of Current or Ex-Partner: No    Emotionally Abused: No    Physically Abused: No    Sexually Abused: No    Past Surgical History:  Procedure Laterality Date   COLONOSCOPY WITH PROPOFOL  N/A 09/27/2019   Procedure: COLONOSCOPY WITH PROPOFOL ;  Surgeon: Unk Corinn Skiff, MD;  Location: [facility] END[facility]COPY;  Service: Gastroenterology;  Laterality: N/A;   ESOPHAGOGASTRODUODENOSCOPY (EGD) WITH PROPOFOL  N/A 09/27/2019   Procedure: ESOPHAGOGASTRODUODENOSCOPY (EGD) WITH PROPOFOL ;  Surgeon: Unk Corinn Skiff, MD;  Location: [facility] ENDOSCOPY;  Service: Gastroenterology;  Laterality: N/A;   TONSILLECTOMY AND ADENOIDECTOMY  1961    Family History  Problem Relation Age of Onset   Hypertension Mother    Hyperlipidemia Mother    Alzheimer's disease Mother    Heart disease Father    Heart attack Father    Hyperlipidemia Father    Hypertension Father    Anxiety disorder Daughter    Depression Daughter    Diabetes Daughter    Birth defects Brother    Diabetes Brother    Intellectual disability Son    Breast cancer Neg Hx     Allergies  Allergen Reactions   Livalo [Pitavastatin]    Statins     Current Outpatient Medications on File Prior to Visit  Medication Sig Dispense Refill   ACCU-CHEK GUIDE TEST test strip USE TO CHECK BLOOD SUGARS UP TO 3 TIMES DAILY. 100 strip 2   Accu-Chek Softclix Lancets lancets Test blood sugar once daily 100 each 3   aspirin EC 81 MG tablet Take 81 mg by mouth daily.     Blood Glucose Monitoring Suppl DEVI 1 each by Does not apply route in the morning, at noon, and at bedtime. May substitute to any manufacturer covered by patient's insurance. 1 each 0    Cholecalciferol (VITAMIN D3) 50 MCG (2000 UT) TABS 1 tablet     cyanocobalamin 500 MCG tablet Take 500 mcg by mouth daily.     ezetimibe  (ZETIA ) 10 MG tablet TAKE 1 TABLET BY MOUTH EVERY DAY FOR CHOLESTEROL 90 tablet 0   loperamide (IMODIUM) 2 MG capsule Taking 1/2 capsule by mouth at bedtime as needed     metoprolol  succinate (TOPROL -XL) 25 MG 24 hr tablet Take 1 tablet (25 mg total) by mouth daily. for blood pressure. 90 tablet 0   Omega-3 Fatty Acids (FISH OIL PO) Take 1,200 mg by mouth 2 (two) times daily.     Oyster Shell Calcium 500 MG TABS  valsartan -hydrochlorothiazide  (DIOVAN -HCT) 80-12.5 MG tablet TAKE 1 TABLET BY MOUTH EVERY DAY FOR BLOOD PRESSURE 90 tablet 0   hydrOXYzine  (ATARAX ) 10 MG tablet TAKE 1 TABLET (10 MG TOTAL) BY MOUTH TWICE A DAY AS NEEDED FOR ANXIETY (Patient not taking: Reported on 02/24/2024) 180 tablet 0   No current facility-administered medications on file prior to visit.    BP (!) 144/72   Pulse 63   Temp 97.8 F (36.6 C) (Temporal)   Ht 5' 2 (1.575 m)   Wt 150 lb (68 kg)   SpO2 100%   BMI 27.44 kg/m  Objective:   Physical Exam Cardiovascular:     Rate and Rhythm: Normal rate and regular rhythm.  Pulmonary:     Effort: Pulmonary effort is normal.     Breath sounds: Normal breath sounds.  Musculoskeletal:     Cervical back: Neck supple.  Skin:    General: Skin is warm and dry.  Neurological:     Mental Status: She is alert and oriented to person, place, and time.  Psychiatric:        Mood and Affect: Mood normal.     Physical Exam        Assessment & Plan:  Irritable bowel syndrome with diarrhea Assessment & Plan: Following with GI. Waxes and wanes.   She will be following up soon.      Gastroesophageal reflux disease with esophagitis without hemorrhage Assessment & Plan: No concerns.  Continue to monitor.   Controlled type 2 diabetes mellitus with hyperglycemia, without long-term current use of insulin  (HCC) Assessment & Plan: Repeat A1c pending. Remain off treatment.  Foot exam today. Urine microalbumin done pending.  Orders: -     Hemoglobin A1c -     Microalbumin / creatinine urine ratio  Screening mammogram for breast cancer -     3D Screening Mammogram, Left and Right; Future  Estrogen deficiency -     DG Bone Density; Future  Hyperlipidemia, unspecified hyperlipidemia type Assessment & Plan: Repeat lipid panel pending.  Continue Zetia  10 mg daily.  Statin intolerant.  Orders: -     Lipid panel -     Comprehensive metabolic panel with GFR  Essential hypertension Assessment & Plan: Above goal today, home readings are at goal.  Continue valsartan -hydrochlorothiazide  50-12.5 mg daily, metoprolol  succinate 25 mg daily. CMP pending.  Orders: -     CBC  OSA (obstructive sleep apnea) Assessment & Plan: Continue CPAP nightly.   Chronic pain of right knee Assessment & Plan: Following with orthopedics. Continue physical therapy.     Assessment and Plan Assessment & Plan         Comer MARLA Gaskins, NP

## 2024-02-24 NOTE — Assessment & Plan Note (Signed)
 Above goal today, home readings are at goal.  Continue valsartan -hydrochlorothiazide  50-12.5 mg daily, metoprolol  succinate 25 mg daily. CMP pending.

## 2024-02-25 ENCOUNTER — Ambulatory Visit: Payer: Self-pay | Admitting: Primary Care

## 2024-02-25 DIAGNOSIS — E1165 Type 2 diabetes mellitus with hyperglycemia: Secondary | ICD-10-CM

## 2024-02-25 DIAGNOSIS — I10 Essential (primary) hypertension: Secondary | ICD-10-CM

## 2024-02-25 DIAGNOSIS — G4733 Obstructive sleep apnea (adult) (pediatric): Secondary | ICD-10-CM | POA: Diagnosis not present

## 2024-02-25 DIAGNOSIS — E785 Hyperlipidemia, unspecified: Secondary | ICD-10-CM

## 2024-03-01 DIAGNOSIS — M1712 Unilateral primary osteoarthritis, left knee: Secondary | ICD-10-CM | POA: Diagnosis not present

## 2024-03-01 DIAGNOSIS — G8929 Other chronic pain: Secondary | ICD-10-CM | POA: Diagnosis not present

## 2024-03-01 DIAGNOSIS — M25562 Pain in left knee: Secondary | ICD-10-CM | POA: Diagnosis not present

## 2024-03-03 ENCOUNTER — Ambulatory Visit: Payer: Self-pay | Admitting: Primary Care

## 2024-03-03 ENCOUNTER — Ambulatory Visit
Admission: RE | Admit: 2024-03-03 | Discharge: 2024-03-03 | Disposition: A | Discharge status: Home / Self Care | Payer: Self-pay | Source: Ambulatory Visit | Attending: Primary Care | Admitting: Primary Care

## 2024-03-03 DIAGNOSIS — I10 Essential (primary) hypertension: Secondary | ICD-10-CM | POA: Insufficient documentation

## 2024-03-03 DIAGNOSIS — I2510 Atherosclerotic heart disease of native coronary artery without angina pectoris: Secondary | ICD-10-CM

## 2024-03-03 DIAGNOSIS — E785 Hyperlipidemia, unspecified: Secondary | ICD-10-CM

## 2024-03-03 DIAGNOSIS — E1165 Type 2 diabetes mellitus with hyperglycemia: Secondary | ICD-10-CM | POA: Insufficient documentation

## 2024-03-11 ENCOUNTER — Telehealth: Payer: Self-pay | Admitting: *Deleted

## 2024-03-11 NOTE — Telephone Encounter (Signed)
 Lmovm to verify card hx.

## 2024-03-12 NOTE — Progress Notes (Signed)
  Cardiology Office Note   Date:  03/15/2024  ID:  Tamaira Ciriello, DOB October 10, 1950, MRN [id number] PCP: Gretta Comer POUR, NP  [facility] Providers Cardiologist:  Caron Poser, MD     History of Present Illness Amy Wilcox is a 73 y.o. female PMH HLD, DM 2 who presents for further evaluation and management of abnormal coronary calcium score.  Patient presents for further evaluation of an abnormal calcium score.  She had a calcium score of 500 on 02/2024 with three-vessel calcium.  Her last LDL 126 02/2024.  She has a long history of statin intolerance and has trialed at least 5 different statins per her report.  She reports intermittent chest discomfort which happens about once per week.  This has been going on for about a year.  She says that it seems to happen after she has been exerting herself and subsides quickly after.  She has never had a contrasted CT scan before; she notes that her father and her son both have mild reactions to contrast dye (hives).  Relevant CVD History -CAC score 500 02/2024 with three-vessel calcium   ROS: Pt denies any chest discomfort, jaw pain, arm pain, palpitations, syncope, presyncope, orthopnea, PND, or LE edema.  Studies Reviewed I have independently reviewed the patient's ECG, CT scan from 02/2024, prior medical records, and recent blood work.  Physical Exam VS:  BP 136/80 (BP Location: Right Arm, Patient Position: Sitting, Cuff Size: Normal)   Pulse 72   Ht 5' 2 (1.575 m)   Wt 148 lb (67.1 kg)   SpO2 98%   BMI 27.07 kg/m        Wt Readings from Last 3 Encounters:  03/15/24 148 lb (67.1 kg)  02/24/24 150 lb (68 kg)  02/18/24 149 lb (67.6 kg)    GEN: No acute distress. NECK: No JVD; No carotid bruits. CARDIAC: RRR, no murmurs, rubs, gallops. RESPIRATORY:  Clear to auscultation. EXTREMITIES:  Warm and well-perfused. No edema.  ASSESSMENT AND PLAN Severely elevated coronary calcium score Chest discomfort Patient presents with a severely  elevated coronary calcium score with three-vessel disease.  She has exertional chest discomfort which occurs approximately once per week.  She also has history of statin intolerance and is trialed about 5 different statins.  She gets severe myalgias with these.  Plan: - Continue ASA 81 mg daily - Continue Zetia  10 mg daily - Start Repatha  - Coronary CT angiogram; there is a family history of mild contrast dye reaction and father and son (hives).  She has never personally had exposure to contrast dye before.  We discussed an empiric dose of Benadryl beforehand and close monitoring during.  She reports that her son does well with Benadryl and it seems to take care of his allergy.  HLD Statin intolerant Last LDL 126 02/2024.  History of statin intolerance with intolerance to a reported 5 different agents.  As above, given the known presence of CAC and symptoms, we will opt for LDL goal less than 55.  Continue Zetia  10 mg daily and start Repatha .  Will recheck lipids next visit.  HTN Appears relatively well-controlled today.  Continue valsartan -HCTZ 80-12.5 mg daily.  Will address this at subsequent visits once we know her coronary status as this is not her principal problem.        Dispo: RTC 2 months after cardiac testing  Signed, Caron Poser, MD

## 2024-03-15 ENCOUNTER — Other Ambulatory Visit: Payer: Self-pay

## 2024-03-15 ENCOUNTER — Telehealth: Payer: Self-pay | Admitting: Pharmacy Technician

## 2024-03-15 ENCOUNTER — Other Ambulatory Visit (HOSPITAL_COMMUNITY): Payer: Self-pay

## 2024-03-15 ENCOUNTER — Ambulatory Visit

## 2024-03-15 VITALS — BP 136/80 | HR 72 | Ht 62.0 in | Wt 148.0 lb

## 2024-03-15 DIAGNOSIS — R079 Chest pain, unspecified: Secondary | ICD-10-CM

## 2024-03-15 DIAGNOSIS — I2510 Atherosclerotic heart disease of native coronary artery without angina pectoris: Secondary | ICD-10-CM

## 2024-03-15 DIAGNOSIS — E782 Mixed hyperlipidemia: Secondary | ICD-10-CM | POA: Insufficient documentation

## 2024-03-15 DIAGNOSIS — Z789 Other specified health status: Secondary | ICD-10-CM | POA: Diagnosis not present

## 2024-03-15 MED ORDER — REPATHA SURECLICK 140 MG/ML ~~LOC~~ SOAJ: 140.0000 mg | SUBCUTANEOUS | 6 refills | Status: DC

## 2024-03-15 MED ORDER — METOPROLOL TARTRATE 100 MG PO TABS: ORAL_TABLET | ORAL | 0 refills | Status: DC

## 2024-03-15 NOTE — Telephone Encounter (Signed)
 Hi, her copay was high so I got her a grant. I called her and asked if she wanted to get this at the [facility] since she lives in [place] and she said yes. I added the grant into wam and made a note. Can you send the praluent  to the [facility] [address] [place], [place], [place] [address] please? Thank you!      Pharmacy Patient Advocate Encounter  Received notification from [facility] that Prior Authorization for praluent  75mg  has been APPROVED from 02/10/24 to until further notice. Ran test claim, Copay is $486.58. This test claim was processed through [facility]- copay amounts may vary at other pharmacies due to pharmacy/plan contracts, or as the patient moves through the different stages of their insurance plan.   PA #/Case ID/Reference #: [id number]    I got her a grant  Patient Advocate Encounter   The patient was approved for a [employer] grant that will help cover the cost of repatha  Total amount awarded, 2500.  Effective: 02/14/24 - 02/12/25   APW:[id number] ERW:[id number] Hmnle:[id number] PI:[id number] [employer] ID: [id number]   Pharmacy provided with approval and processing information. Patient informed via mychart/telephone

## 2024-03-15 NOTE — Patient Instructions (Addendum)
 Medication Instructions:  Your physician recommends the following medication changes.  START TAKING: Evolocumab  (Repatha  SureClick) 140mg  injection once very 14 days  Your physician recommends that you continue on your current medications as directed. Please refer to the Current Medication list given to you today.   *If you need a refill on your cardiac medications before your next appointment, please call your pharmacy*  Lab Work: No labs ordered today  If you have labs (blood work) drawn today and your tests are completely normal, you will receive your results only by: MyChart Message (if you have MyChart) OR A paper copy in the mail If you have any lab test that is abnormal or we need to change your treatment, we will call you to review the results.  Testing/Procedures:   Your cardiac CT will be scheduled at one of the below locations:   [facility] [address] [place], [place] [address] [phone]  Please arrive 15 mins early for check-in and test prep.  There is spacious parking and easy access to the radiology department from the [facility] entrance. Please enter here and check-in with the desk attendant.   Please follow these instructions carefully (unless otherwise directed):  An IV will be required for this test and Nitroglycerin will be given.    On the Night Before the Test: Be sure to Drink plenty of water. Do not consume any caffeinated/decaffeinated beverages or chocolate 12 hours prior to your test. Do not take any antihistamines 12 hours prior to your test.   On the Day of the Test: Drink plenty of water until 1 hour prior to the test. Do not eat any food 1 hour prior to test. You may take your regular medications prior to the test.  Take metoprolol  (Lopressor ) 100 mg two hours prior to test. Please HOLD valsartan -hydrochlorothiazide . Please HOLD Metoprolol  25 mg Succinate (Toprol  XL) day of the test. FEMALES-  please wear underwire-free bra if available, avoid dresses & tight clothing      After the Test: Drink plenty of water. After receiving IV contrast, you may experience a mild flushed feeling. This is normal. On occasion, you may experience a mild rash up to 24 hours after the test. This is not dangerous. If this occurs, you can take Benadryl 25 mg, Zyrtec, Claritin, or Allegra and increase your fluid intake. (Patients taking Tikosyn should avoid Benadryl, and may take Zyrtec, Claritin, or Allegra) If you experience trouble breathing, this can be serious. If it is severe call 911 IMMEDIATELY. If it is mild, please call our office.  We will call to schedule your test 2-4 weeks out understanding that some insurance companies will need an authorization prior to the service being performed.   For more information and frequently asked questions, please visit our website : [email or web address]  For non-scheduling related questions, please contact the cardiac imaging nurse navigator should you have any questions/concerns: Cardiac Imaging Nurse Navigators Direct Office Dial: [phone]   For scheduling needs, including cancellations and rescheduling, please call [place], [phone].    Follow-Up: At [facility], you and your health needs are our priority.  As part of our continuing mission to provide you with exceptional heart care, our providers are all part of one team.  This team includes your primary Cardiologist (physician) and Advanced Practice Providers or APPs (Physician Assistants and Nurse Practitioners) who all work together to provide you with the care you need, when you need it.  Your next appointment:  2 month(s)  Provider:   Caron Poser, MD  We recommend signing up for the patient portal called MyChart.  Sign up information is provided on this After Visit Summary.  MyChart is used to connect with patients for Virtual Visits (Telemedicine).  Patients  are able to view lab/test results, encounter notes, upcoming appointments, etc.  Non-urgent messages can be sent to your provider as well.   To learn more about what you can do with MyChart, go to [email or web address].

## 2024-03-15 NOTE — Telephone Encounter (Signed)
 Patient Advocate Encounter   The patient was approved for a [employer] grant that will help cover the cost of repatha  Total amount awarded, 2500.  Effective: 02/14/24 - 02/12/25   APW:[id number] ERW:[id number] Hmnle:[id number] PI:[id number] [employer] ID: [id number]   Pharmacy provided with approval and processing information. Patient informed via mychart

## 2024-03-15 NOTE — Telephone Encounter (Signed)
 PER RX REQUEST  Pharmacy Patient Advocate Encounter   Received notification from RX Request Messages that prior authorization for REPATHA  is required/requested.   Insurance verification completed.   The patient is insured through [facility] .   Per PA REQ:  So ran pa for praluent   Pharmacy Patient Advocate Encounter   Received notification from RX Request Messages that prior authorization for PRALUENT  is required/requested.   Insurance verification completed.   The patient is insured through [facility] .   Per test claim: PA required; PA submitted to above mentioned insurance via Latent Key/confirmation #/EOC [id number] Status is pending

## 2024-03-16 ENCOUNTER — Other Ambulatory Visit: Payer: Self-pay

## 2024-03-16 ENCOUNTER — Other Ambulatory Visit (HOSPITAL_COMMUNITY): Payer: Self-pay

## 2024-03-16 MED ORDER — PRALUENT 75 MG/ML ~~LOC~~ SOAJ
75.0000 mg | SUBCUTANEOUS | 3 refills | Status: DC
  Filled 2024-03-16: qty 6, 84d supply, fill #0

## 2024-03-16 NOTE — Addendum Note (Signed)
 Addended by: DARRELL BRUCKNER on: 03/16/2024 08:21 AM   Modules accepted: Orders

## 2024-03-16 NOTE — Telephone Encounter (Signed)
 Rx sent to [facility]

## 2024-03-24 ENCOUNTER — Ambulatory Visit: Admission: EM | Admit: 2024-03-24 | Discharge: 2024-03-24 | Disposition: A | Discharge status: Home / Self Care

## 2024-03-24 ENCOUNTER — Encounter: Payer: Self-pay | Admitting: Emergency Medicine

## 2024-03-24 DIAGNOSIS — S40021A Contusion of right upper arm, initial encounter: Secondary | ICD-10-CM | POA: Diagnosis not present

## 2024-03-24 NOTE — ED Provider Notes (Signed)
 [facility]  Note:  This document was prepared using Conservation officer, historic buildings and may include unintentional dictation errors.  MRN: [id number] DOB: [birth year]  Subjective:   Amy Wilcox is a 73 y.o. female presenting for red skin lesion to right arm this morning.  Patient reports that she noticed this morning when she woke up a red mark on her arm which was not there last night before bed.  Patient denies any pain, itching, secondary symptoms.  Patient denies any known injury or trauma.  Patient was concerned because her husband had a tick bite earlier in the summer that she was concerned maybe this was due to a tick bite.  Patient denies any other secondary symptoms.  No current facility-administered medications for this encounter.  Current Outpatient Medications:    ACCU-CHEK GUIDE TEST test strip, USE TO CHECK BLOOD SUGARS UP TO 3 TIMES DAILY., Disp: 100 strip, Rfl: 2   Accu-Chek Softclix Lancets lancets, Test blood sugar once daily, Disp: 100 each, Rfl: 3   Alirocumab  (PRALUENT ) 75 MG/ML SOAJ, Inject 1 mL (75 mg total) into the skin every 14 (fourteen) days., Disp: 6 mL, Rfl: 3   aspirin EC 81 MG tablet, Take 81 mg by mouth daily., Disp: , Rfl:    Blood Glucose Monitoring Suppl DEVI, 1 each by Does not apply route in the morning, at noon, and at bedtime. May substitute to any manufacturer covered by patient's insurance., Disp: 1 each, Rfl: 0   Cholecalciferol (VITAMIN D3) 50 MCG (2000 UT) TABS, 1 tablet, Disp: , Rfl:    cyanocobalamin 500 MCG tablet, Take 500 mcg by mouth daily., Disp: , Rfl:    ezetimibe  (ZETIA ) 10 MG tablet, TAKE 1 TABLET BY MOUTH EVERY DAY FOR CHOLESTEROL, Disp: 90 tablet, Rfl: 0   hydrOXYzine  (ATARAX ) 10 MG tablet, TAKE 1 TABLET (10 MG TOTAL) BY MOUTH TWICE A DAY AS NEEDED FOR ANXIETY (Patient taking differently: as needed.), Disp: 180 tablet, Rfl: 0   loperamide (IMODIUM) 2 MG capsule, Taking 1/2 capsule by mouth at bedtime as needed, Disp: , Rfl:     metoprolol  succinate (TOPROL -XL) 25 MG 24 hr tablet, Take 1 tablet (25 mg total) by mouth daily. 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   Cataract 2015   Essential hypertension    Fibrocystic breast    Hyperlipidemia    IBS (irritable bowel syndrome)    Idiopathic hematuria    OSA (obstructive sleep apnea)    Sleep apnea 2003   CPAP     Past Surgical History:  Procedure Laterality Date   COLONOSCOPY WITH PROPOFOL  N/A 09/27/2019   Procedure: COLONOSCOPY WITH PROPOFOL ;  Surgeon: Unk Corinn Skiff, MD;  Location: [facility] ENDOSCOPY;  Service: Gastroenterology;  Laterality: N/A;   ESOPHAGOGASTRODUODENOSCOPY (EGD) WITH PROPOFOL  N/A 09/27/2019   Procedure: ESOPHAGOGASTRODUODENOSCOPY (EGD) WITH PROPOFOL ;  Surgeon: Unk Corinn Skiff, MD;  Location: [facility]OSCOPY;  Service: Gastroenterology;  Laterality: N/A;   TONSILLECTOMY AND ADENOIDECTOMY  1961    Family History  Problem Relation Age of Onset   Hypertension Mother    Hyperlipidemia Mother    Alzheimer's disease Mother    Heart disease Father    Heart attack Father    Hyperlipidemia Father    Hypertension Father    Anxiety disorder Daughter    Depression Daughter    Diabetes Daughter    Birth defects Brother    Diabetes Brother    Intellectual disability Son    Breast cancer Neg Hx     Social History   Tobacco Use   Smoking status:  Never   Smokeless tobacco: Never  Vaping Use   Vaping status: Never Used  Substance Use Topics   Alcohol use: No   Drug use: No    ROS Refer to HPI for ROS details.  Objective:   Vitals: BP (!) 149/75 (BP Location: Left Arm)   Pulse 67   Temp 98.3 F (36.8 C) (Oral)   Resp 18   SpO2 97%   Physical Exam Vitals and nursing note reviewed.  Constitutional:      General: She is not in acute distress.    Appearance: Normal appearance. She is well-developed. She is not ill-appearing or toxic-appearing.  HENT:     Head: Normocephalic and atraumatic.  Cardiovascular:     Rate and Rhythm: Normal rate.  Pulmonary:     Effort: Pulmonary effort is normal. No respiratory distress.  Skin:    General: Skin is warm and dry.     Findings: Ecchymosis and erythema present. No abrasion, abscess, lesion, petechiae, rash or wound.  Neurological:     General: No focal deficit present.     Mental Status: She is alert and oriented to person, place, and time.  Psychiatric:        Mood and Affect: Mood normal.        Behavior: Behavior normal.     Procedures  No results found for this or any previous visit (from the past 24 hours).  No results found.   Assessment and Plan :     Discharge Instructions       1. Arm contusion, right, initial encounter (Primary) - Skin lesion to right arm most likely secondary to subcutaneous blood vessel rupture.  Continue to monitor site for any change in severity of symptoms if there is any escalation of recurrent symptoms or development of new symptoms follow-up for further evaluation and management.       Sung R[place] B Kalynne Womac   Zellie Jenning, [place], [place] 03/24/24 1333

## 2024-03-24 NOTE — Discharge Instructions (Addendum)
  1. Arm contusion, right, initial encounter (Primary) - Skin lesion to right arm most likely secondary to subcutaneous blood vessel rupture.  Continue to monitor site for any change in severity of symptoms if there is any escalation of recurrent symptoms or development of new symptoms follow-up for further evaluation and management.

## 2024-03-24 NOTE — ED Triage Notes (Signed)
 Patient woke up this morning a notice a red spot on right ac. Patient denies itching and pain. Patient denies taking anything or putting anything on it.

## 2024-03-26 DIAGNOSIS — K529 Noninfective gastroenteritis and colitis, unspecified: Secondary | ICD-10-CM | POA: Diagnosis not present

## 2024-04-01 ENCOUNTER — Encounter (HOSPITAL_COMMUNITY): Payer: Self-pay

## 2024-04-02 ENCOUNTER — Telehealth (HOSPITAL_COMMUNITY): Payer: Self-pay | Admitting: *Deleted

## 2024-04-02 NOTE — Telephone Encounter (Signed)
 Reaching out to patient to offer assistance regarding upcoming cardiac imaging study; pt verbalizes understanding of appt date/time, parking situation and where to check in, pre-test NPO status and medications ordered, and verified current allergies; name and call back number provided for further questions should they arise Sid Seats RN Navigator Cardiac Imaging [facility] [phone] office [phone] cell

## 2024-04-05 ENCOUNTER — Ambulatory Visit: Payer: Self-pay

## 2024-04-05 ENCOUNTER — Ambulatory Visit
Admission: RE | Admit: 2024-04-05 | Discharge: 2024-04-05 | Disposition: A | Discharge status: Home / Self Care | Source: Ambulatory Visit

## 2024-04-05 DIAGNOSIS — I2510 Atherosclerotic heart disease of native coronary artery without angina pectoris: Secondary | ICD-10-CM | POA: Diagnosis present

## 2024-04-05 DIAGNOSIS — R079 Chest pain, unspecified: Secondary | ICD-10-CM | POA: Diagnosis present

## 2024-04-05 MED ORDER — NITROGLYCERIN 0.4 MG SL SUBL
0.8000 mg | SUBLINGUAL_TABLET | Freq: Once | SUBLINGUAL | Status: AC
  Administered 2024-04-05: 0.8 mg via SUBLINGUAL
  Filled 2024-04-05: qty 25

## 2024-04-05 MED ORDER — IOHEXOL 350 MG/ML SOLN
100.0000 mL | Freq: Once | INTRAVENOUS | Status: AC | PRN
Start: 2024-04-05 — End: 2024-04-05
  Administered 2024-04-05: 100 mL via INTRAVENOUS

## 2024-04-05 NOTE — Progress Notes (Signed)
 Patient tolerated CT well. Vital signs stable encourage to drink water throughout day.Reasons explained and verbalized understanding. Ambulated steady gait.

## 2024-04-09 ENCOUNTER — Other Ambulatory Visit: Payer: Self-pay | Admitting: Primary Care

## 2024-04-09 DIAGNOSIS — E785 Hyperlipidemia, unspecified: Secondary | ICD-10-CM

## 2024-04-09 DIAGNOSIS — I10 Essential (primary) hypertension: Secondary | ICD-10-CM

## 2024-04-14 ENCOUNTER — Ambulatory Visit: Admitting: Anesthesiology

## 2024-04-14 ENCOUNTER — Encounter: Payer: Self-pay | Admitting: Gastroenterology

## 2024-04-14 ENCOUNTER — Other Ambulatory Visit: Payer: Self-pay

## 2024-04-14 ENCOUNTER — Encounter
Admission: RE | Disposition: A | Discharge status: Home / Self Care | Payer: Self-pay | Source: Home / Self Care | Attending: Gastroenterology

## 2024-04-14 ENCOUNTER — Ambulatory Visit
Admission: RE | Admit: 2024-04-14 | Discharge: 2024-04-14 | Disposition: A | Discharge status: Home / Self Care | Attending: Gastroenterology | Admitting: Gastroenterology

## 2024-04-14 DIAGNOSIS — G4733 Obstructive sleep apnea (adult) (pediatric): Secondary | ICD-10-CM | POA: Insufficient documentation

## 2024-04-14 DIAGNOSIS — K3189 Other diseases of stomach and duodenum: Secondary | ICD-10-CM | POA: Insufficient documentation

## 2024-04-14 DIAGNOSIS — Z7984 Long term (current) use of oral hypoglycemic drugs: Secondary | ICD-10-CM | POA: Diagnosis not present

## 2024-04-14 DIAGNOSIS — K2100 Gastro-esophageal reflux disease with esophagitis, without bleeding: Secondary | ICD-10-CM | POA: Diagnosis not present

## 2024-04-14 DIAGNOSIS — R197 Diarrhea, unspecified: Secondary | ICD-10-CM | POA: Diagnosis not present

## 2024-04-14 DIAGNOSIS — E119 Type 2 diabetes mellitus without complications: Secondary | ICD-10-CM | POA: Diagnosis not present

## 2024-04-14 DIAGNOSIS — K319 Disease of stomach and duodenum, unspecified: Secondary | ICD-10-CM | POA: Diagnosis not present

## 2024-04-14 DIAGNOSIS — K529 Noninfective gastroenteritis and colitis, unspecified: Secondary | ICD-10-CM | POA: Insufficient documentation

## 2024-04-14 DIAGNOSIS — K209 Esophagitis, unspecified without bleeding: Secondary | ICD-10-CM | POA: Diagnosis not present

## 2024-04-14 DIAGNOSIS — K259 Gastric ulcer, unspecified as acute or chronic, without hemorrhage or perforation: Secondary | ICD-10-CM | POA: Diagnosis not present

## 2024-04-14 DIAGNOSIS — K208 Other esophagitis without bleeding: Secondary | ICD-10-CM | POA: Diagnosis not present

## 2024-04-14 DIAGNOSIS — K2970 Gastritis, unspecified, without bleeding: Secondary | ICD-10-CM | POA: Diagnosis not present

## 2024-04-14 DIAGNOSIS — I10 Essential (primary) hypertension: Secondary | ICD-10-CM | POA: Diagnosis not present

## 2024-04-14 HISTORY — PX: COLONOSCOPY: SHX5424

## 2024-04-14 HISTORY — PX: ESOPHAGOGASTRODUODENOSCOPY: SHX5428

## 2024-04-14 SURGERY — COLONOSCOPY
Anesthesia: General

## 2024-04-14 MED ORDER — LIDOCAINE HCL (CARDIAC) PF 100 MG/5ML IV SOSY
PREFILLED_SYRINGE | INTRAVENOUS | Status: DC | PRN
  Administered 2024-04-14: 60 mg via INTRAVENOUS

## 2024-04-14 MED ORDER — GLYCOPYRROLATE 0.2 MG/ML IJ SOLN
INTRAMUSCULAR | Status: DC | PRN
  Administered 2024-04-14: .2 mg via INTRAVENOUS

## 2024-04-14 MED ORDER — PROPOFOL 10 MG/ML IV BOLUS
INTRAVENOUS | Status: DC | PRN
  Administered 2024-04-14: 70 mg via INTRAVENOUS

## 2024-04-14 MED ORDER — SODIUM CHLORIDE 0.9 % IV SOLN: INTRAVENOUS | Status: DC

## 2024-04-14 MED ORDER — PROPOFOL 500 MG/50ML IV EMUL
INTRAVENOUS | Status: DC | PRN
  Administered 2024-04-14: 150 ug/kg/min via INTRAVENOUS

## 2024-04-14 NOTE — Transfer of Care (Signed)
 Immediate Anesthesia Transfer of Care Note  Patient: Amy Wilcox  Procedure(s) Performed: COLONOSCOPY EGD (ESOPHAGOGASTRODUODENOSCOPY)  Patient Location: Endoscopy Unit  Anesthesia Type:General  Level of Consciousness: drowsy  Airway & Oxygen Therapy: Patient Spontanous Breathing and Patient connected to face mask oxygen  Post-op Assessment: Report given to RN and Post -op Vital signs reviewed and stable  Post vital signs: Reviewed and stable  Last Vitals:  Vitals Value Taken Time  BP 100/56 04/14/24 09:41  Temp 35.6 C 04/14/24 09:41  Pulse 74 04/14/24 09:41  Resp 13 04/14/24 09:41  SpO2 98 % 04/14/24 09:41    Last Pain:  Vitals:   04/14/24 0941  TempSrc: Tympanic  PainSc: Asleep         Complications: No notable events documented.

## 2024-04-14 NOTE — H&P (Signed)
 Corinn JONELLE Brooklyn, MD [facility], [address] [address]  [place], [place] [address]  Main: [phone] Fax:  [phone] Pager: [phone]   Primary Care Physician:  Gretta Comer POUR, NP Primary Gastroenterologist:  Dr. Corinn JONELLE Brooklyn  Pre-Procedure History & Physical: HPI:  Amy Wilcox is a 73 y.o. female is here for an endoscopy and colonoscopy.   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   Cataract 2015   Essential hypertension    Fibrocystic breast    Hyperlipidemia    IBS (irritable bowel syndrome)    Idiopathic hematuria    OSA (obstructive sleep apnea)    Sleep apnea 2003   CPAP    Past Surgical History:  Procedure Laterality Date   COLONOSCOPY WITH PROPOFOL  N/A 09/27/2019   Procedure: COLONOSCOPY WITH PROPOFOL ;  Surgeon: Brooklyn Corinn Skiff, MD;  Location: [facility] ENDOSCOPY;  Service: Gastroenterology;  Laterality: N/A;   ESOPHAGOGASTRODUODENOSCOPY (EGD) WITH PROPOFOL  N/A 09/27/2019   Procedure: ESOPHAGOGASTRODUODENOSCOPY (EGD) WITH PROPOFOL ;  Surgeon: [place] Corinn Skiff, MD;  Location: [facility] ENDOSCOPY;  Service: Gastroenterology;  Laterality: N/A;   TONSILLECTOMY AND ADENOIDECTOMY  1961    Prior to Admission medications   Medication Sig Start Date End Date Taking? Authorizing Provider  Cholecalciferol (VITAMIN D3) 50 MCG (2000 UT) TABS 1 tablet   Yes [provider]  cyanocobalamin 500 MCG tablet Take 500 mcg by mouth daily.   Yes [provider]  ezetimibe  (ZETIA ) 10 MG tablet TAKE 1 TABLET BY MOUTH EVERY DAY FOR CHOLESTEROL 04/09/24  Yes Clark, Katherine K, NP  Omega-3 Fatty Acids (FISH OIL PO) Take 1,200 mg by mouth 2 (two) times daily.   Yes [provider]  Allie Gutta Calcium 500 MG TABS    Yes [provider]  valsartan -hydrochlorothiazide  (DIOVAN -HCT) 80-12.5 MG tablet TAKE 1 TABLET BY MOUTH EVERY DAY FOR BLOOD PRESSURE 04/09/24  Yes  Gretta Comer POUR, NP  ACCU-CHEK GUIDE TEST test strip USE TO CHECK BLOOD SUGARS UP TO 3 TIMES DAILY. 12/07/23   Clark, Katherine K, NP  Accu-Chek Softclix Lancets lancets Test blood sugar once daily 02/09/24   Clark, Katherine K, NP  Alirocumab  (PRALUENT ) 75 MG/ML SOAJ Inject 1 mL (75 mg total) into the skin every 14 (fourteen) days. 03/16/24   Argentina Clap, MD  aspirin EC 81 MG tablet Take 81 mg by mouth daily.    [provider]  Blood Glucose Monitoring Suppl DEVI 1 each by Does not apply route in the morning, at noon, and at bedtime. May substitute to any manufacturer covered by patient's insurance. 02/18/23   Clark, Katherine K, NP  hydrOXYzine  (ATARAX ) 10 MG tablet TAKE 1 TABLET (10 MG TOTAL) BY MOUTH TWICE A DAY AS NEEDED FOR ANXIETY Patient taking differently: as needed. 04/16/23   Clark, Katherine K, NP  loperamide (IMODIUM) 2 MG capsule Taking 1/2 capsule by mouth at bedtime as needed    [provider]  metoprolol  succinate (TOPROL -XL) 25 MG 24 hr tablet TAKE 1 TABLET BY MOUTH EVERY DAY FOR BLOOD PRESSURE 04/09/24   Gretta Comer POUR, NP    Allergies as of 04/05/2024 - Review Complete 04/05/2024  Allergen Reaction Noted   Livalo [pitavastatin]  02/20/2017   Statins  02/20/2017    Family History  Problem Relation Age of Onset   Hypertension Mother    Hyperlipidemia Mother    Alzheimer's disease Mother    Heart disease Father  Heart attack Father    Hyperlipidemia Father    Hypertension Father    Anxiety disorder Daughter    Depression Daughter    Diabetes Daughter    Birth defects Brother    Diabetes Brother    Intellectual disability Son    Breast cancer Neg Hx     Social History   Socioeconomic History   Marital status: Married    Spouse name: Not on file   Number of children: Not on file   Years of education: Not on file   Highest education level: 12th grade  Occupational History   Occupation: retired  Tobacco Use   Smoking status: Never    Smokeless tobacco: Never  Vaping Use   Vaping status: Never Used  Substance and Sexual Activity   Alcohol use: No   Drug use: No   Sexual activity: Yes  Other Topics Concern   Not on file  Social History Narrative   Married.   3 children.   Retired. Once worked a Futures trader.   Enjoys spending time with family.   Social Drivers of Corporate investment banker Strain: Low Risk  (02/17/2024)   Overall Financial Resource Strain (CARDIA)    Difficulty of Paying Living Expenses: Not hard at all  Food Insecurity: No Food Insecurity (02/17/2024)   Hunger Vital Sign    Worried About Running Out of Food in the Last Year: Never true    Ran Out of Food in the Last Year: Never true  Transportation Needs: No Transportation Needs (02/17/2024)   PRAPARE - Administrator, Civil Service (Medical): No    Lack of Transportation (Non-Medical): No  Physical Activity: Insufficiently Active (02/17/2024)   Exercise Vital Sign    Days of Exercise per Week: 1 day    Minutes of Exercise per Session: 40 min  Stress: No Stress Concern Present (02/17/2024)   [employer] of Occupational Health - Occupational Stress Questionnaire    Feeling of Stress: Only a little  Social Connections: Socially Integrated (02/17/2024)   Social Connection and Isolation Panel    Frequency of Communication with Friends and Family: More than three times a week    Frequency of Social Gatherings with Friends and Family: Twice a week    Attends Religious Services: More than 4 times per year    Active Member of [employer] or Organizations: Yes    Attends Engineer, structural: More than 4 times per year    Marital Status: Married  Catering manager Violence: Not At Risk (02/18/2024)   Humiliation, Afraid, Rape, and Kick questionnaire    Fear of Current or Ex-Partner: No    Emotionally Abused: No    Physically Abused: No    Sexually Abused: No    Review of Systems: See HPI, otherwise negative ROS  Physical  Exam: BP (!) 152/68   Pulse 95   Temp (!) 97.1 F (36.2 C) (Temporal)   Resp 16   Ht 5' 2 (1.575 m)   Wt 65.3 kg   SpO2 99%   BMI 26.34 kg/m  General:   Alert,  pleasant and cooperative in NAD Head:  Normocephalic and atraumatic. Neck:  Supple; no masses or thyromegaly. Lungs:  Clear throughout to auscultation.    Heart:  Regular rate and rhythm. Abdomen:  Soft, nontender and nondistended. Normal bowel sounds, without guarding, and without rebound.   Neurologic:  Alert and  oriented x4;  grossly normal neurologically.  Impression/Plan: Amy Wilcox is here for an  endoscopy and colonoscopy to be performed for chronic diarrhea  Risks, benefits, limitations, and alternatives regarding  endoscopy and colonoscopy have been reviewed with the patient.  Questions have been answered.  All parties agreeable.   Corinn Brooklyn, MD  04/14/2024, 8:32 AM

## 2024-04-14 NOTE — Op Note (Signed)
 [facility] Patient Name: Amy Wilcox Procedure Date: 04/14/2024 9:04 AM MRN: [id number] Account #: [id number] Date of Birth: Nov 22, 1950 Admit Type: Outpatient Age: 73 Room: [facility] ENDO ROOM 4 Gender: Female Note Status: Finalized Instrument Name: Upper GI Scope [id number] Procedure:             Upper GI endoscopy Indications:           Diarrhea Providers:             Corinn Jess Brooklyn MD, MD Referring MD:          Comer LOIS Gaskins (Referring MD) Medicines:             General Anesthesia Complications:         No immediate complications. Estimated blood loss: None. Procedure:             Pre-Anesthesia Assessment:                        - Prior to the procedure, a History and Physical was                         performed, and patient medications and allergies were                         reviewed. The patient is competent. The risks and                         benefits of the procedure and the sedation options and                         risks were discussed with the patient. All questions                         were answered and informed consent was obtained.                         Patient identification and proposed procedure were                         verified by the physician, the nurse, the                         anesthesiologist, the anesthetist and the technician                         in the pre-procedure area in the procedure room in the                         endoscopy suite. Mental Status Examination: alert and                         oriented. Airway Examination: normal oropharyngeal                         airway and neck mobility. Respiratory Examination:                         clear to auscultation. CV Examination: normal.  Prophylactic Antibiotics: The patient does not require                         prophylactic antibiotics. Prior Anticoagulants: The                         patient has taken no  anticoagulant or antiplatelet                         agents. ASA Grade Assessment: II - A patient with mild                         systemic disease. After reviewing the risks and                         benefits, the patient was deemed in satisfactory                         condition to undergo the procedure. The anesthesia                         plan was to use general anesthesia. Immediately prior                         to administration of medications, the patient was                         re-assessed for adequacy to receive sedatives. The                         heart rate, respiratory rate, oxygen saturations,                         blood pressure, adequacy of pulmonary ventilation, and                         response to care were monitored throughout the                         procedure. The physical status of the patient was                         re-assessed after the procedure.                        After obtaining informed consent, the endoscope was                         passed under direct vision. Throughout the procedure,                         the patient's blood pressure, pulse, and oxygen                         saturations were monitored continuously. The Endoscope                         was introduced through the mouth, and advanced to the  second part of duodenum. The upper GI endoscopy was                         accomplished without difficulty. The patient tolerated                         the procedure well. Findings:      The duodenal bulb and second portion of the duodenum were normal.       Biopsies were taken with a cold forceps for histology.      Multiple dispersed small erosions with stigmata of recent bleeding were       found in the gastric body. Biopsies were taken with a cold forceps for       Helicobacter pylori testing.      Diffuse mildly erythematous mucosa without bleeding was found in the       gastric antrum.  Biopsies were taken with a cold forceps for Helicobacter       pylori testing.      The cardia and gastric fundus were normal on retroflexion.      LA Grade A (one or more mucosal breaks less than 5 mm, not extending       between tops of 2 mucosal folds) esophagitis with no bleeding was found       at the gastroesophageal junction.      The examined esophagus was normal. Biopsies were taken with a cold       forceps for histology. Impression:            - Normal duodenal bulb and second portion of the                         duodenum. Biopsied.                        - Erosive gastropathy with stigmata of recent                         bleeding. Biopsied.                        - Erythematous mucosa in the antrum. Biopsied.                        - LA Grade A reflux esophagitis with no bleeding.                        - Normal esophagus. Biopsied. Recommendation:        - Await pathology results.                        - Follow an antireflux regimen today.                        - Continue present medications.                        - Proceed with colonoscopy as scheduled                        See colonoscopy report Procedure Code(s):     --- Professional ---  56760, Esophagogastroduodenoscopy, flexible,                         transoral; with biopsy, single or multiple Diagnosis Code(s):     --- Professional ---                        K92.2, Gastrointestinal hemorrhage, unspecified                        K31.89, Other diseases of stomach and duodenum                        K21.00, Gastro-esophageal reflux disease with                         esophagitis, without bleeding                        R19.7, Diarrhea, unspecified CPT copyright 2022 American Medical Association. All rights reserved. The codes documented in this report are preliminary and upon coder review may  be revised to meet current compliance requirements. Dr. Corinn Brooklyn Corinn Jess Brooklyn MD,  MD 04/14/2024 9:21:28 AM This report has been signed electronically. Number of Addenda: 0 Note Initiated On: 04/14/2024 9:04 AM Estimated Blood Loss:  Estimated blood loss: none.      [facility]

## 2024-04-14 NOTE — Anesthesia Preprocedure Evaluation (Addendum)
 Anesthesia Evaluation  Patient identified by MRN, date of birth, ID band Patient awake    Reviewed: Allergy & Precautions, NPO status , Patient's Chart, lab work & pertinent test results  Airway Mallampati: II  TM Distance: >3 FB Neck ROM: full    Dental  (+) Teeth Intact   Pulmonary neg pulmonary ROS, sleep apnea    Pulmonary exam normal breath sounds clear to auscultation       Cardiovascular hypertension, negative cardio ROS Normal cardiovascular exam Rhythm:Regular Rate:Normal     Neuro/Psych negative neurological ROS  negative psych ROS   GI/Hepatic negative GI ROS, Neg liver ROS,,,  Endo/Other  negative endocrine ROSdiabetes, Type 2, Oral Hypoglycemic Agents    Renal/GU negative Renal ROS  negative genitourinary   Musculoskeletal   Abdominal Normal abdominal exam  (+)   Peds negative pediatric ROS (+)  Hematology negative hematology ROS (+)   Anesthesia Other Findings Past Medical History: 2003: Allergy     Comment:  Statins 02/03/2020: Benign essential hematuria 2015: Cataract No date: Essential hypertension No date: Fibrocystic breast No date: Hyperlipidemia No date: IBS (irritable bowel syndrome) No date: Idiopathic hematuria No date: OSA (obstructive sleep apnea) 2003: Sleep apnea     Comment:  CPAP  Past Surgical History: 09/27/2019: COLONOSCOPY WITH PROPOFOL ; N/A     Comment:  Procedure: COLONOSCOPY WITH PROPOFOL ;  Surgeon: Unk Corinn Skiff, MD;  Location: [facility] ENDOSCOPY;  Service:               Gastroenterology;  Laterality: N/A; 09/27/2019: ESOPHAGOGASTRODUODENOSCOPY (EGD) WITH PROPOFOL ; N/A     Comment:  Procedure: ESOPHAGOGASTRODUODENOSCOPY (EGD) WITH               PROPOFOL ;  Surgeon: Unk Corinn Skiff, MD;  Location:               [facility] ENDOSCOPY;  Service: Gastroenterology;  Laterality:               N/A; 1961: TONSILLECTOMY AND ADENOIDECTOMY  BMI    Body Mass  Index: 26.34 kg/m      Reproductive/Obstetrics negative OB ROS                              Anesthesia Physical Anesthesia Plan  ASA: 2  Anesthesia Plan: General   Post-op Pain Management:    Induction: Intravenous  PONV Risk Score and Plan: Propofol  infusion and TIVA  Airway Management Planned: Natural Airway and Nasal Cannula  Additional Equipment:   Intra-op Plan:   Post-operative Plan:   Informed Consent: I have reviewed the patients History and Physical, chart, labs and discussed the procedure including the risks, benefits and alternatives for the proposed anesthesia with the patient or authorized representative who has indicated his/her understanding and acceptance.     Dental Advisory Given  Plan Discussed with: Anesthesiologist, CRNA and Surgeon  Anesthesia Plan Comments:          Anesthesia Quick Evaluation

## 2024-04-14 NOTE — Op Note (Signed)
 [facility] Gastroenterology Patient Name: Amy Wilcox Procedure Date: 04/14/2024 9:03 AM MRN: [id number] Account #: [id number] Date of Birth: July 22, 1950 Admit Type: Outpatient Age: 73 Room: [facility] ENDO ROOM 4 Gender: Female Note Status: Finalized Instrument Name: Colon Scope [id number] Procedure:             Colonoscopy Indications:           Last colonoscopy: March 2021, Chronic diarrhea,                         Clinically significant diarrhea of unexplained origin Providers:             Corinn Jess Brooklyn MD, MD Referring MD:          JINNY Lum Gaskins, MD (Referring MD) Medicines:             General Anesthesia Complications:         No immediate complications. Estimated blood loss: None. Procedure:             Pre-Anesthesia Assessment:                        - Prior to the procedure, a History and Physical was                         performed, and patient medications and allergies were                         reviewed. The patient is competent. The risks and                         benefits of the procedure and the sedation options and                         risks were discussed with the patient. All questions                         were answered and informed consent was obtained.                         Patient identification and proposed procedure were                         verified by the physician, the nurse, the                         anesthesiologist, the anesthetist and the technician                         in the pre-procedure area in the procedure room in the                         endoscopy suite. Mental Status Examination: alert and                         oriented. Airway Examination: normal oropharyngeal                         airway and neck mobility. Respiratory Examination:  clear to auscultation. CV Examination: normal.                         Prophylactic Antibiotics: The patient does not require                          prophylactic antibiotics. Prior Anticoagulants: The                         patient has taken no anticoagulant or antiplatelet                         agents. ASA Grade Assessment: II - A patient with mild                         systemic disease. After reviewing the risks and                         benefits, the patient was deemed in satisfactory                         condition to undergo the procedure. The anesthesia                         plan was to use general anesthesia. Immediately prior                         to administration of medications, the patient was                         re-assessed for adequacy to receive sedatives. The                         heart rate, respiratory rate, oxygen saturations,                         blood pressure, adequacy of pulmonary ventilation, and                         response to care were monitored throughout the                         procedure. The physical status of the patient was                         re-assessed after the procedure.                        After obtaining informed consent, the colonoscope was                         passed under direct vision. Throughout the procedure,                         the patient's blood pressure, pulse, and oxygen                         saturations were monitored continuously. The  Colonoscope was introduced through the anus and                         advanced to the the terminal ileum, with                         identification of the appendiceal orifice and IC                         valve. The colonoscopy was performed without                         difficulty. The patient tolerated the procedure well.                         The quality of the bowel preparation was evaluated                         using the BBPS [facility] Bowel Preparation Scale) with                         scores of: Right Colon = 3, Transverse Colon = 3 and                         Left  Colon = 3 (entire mucosa seen well with no                         residual staining, small fragments of stool or opaque                         liquid). The total BBPS score equals 9. The terminal                         ileum, ileocecal valve, appendiceal orifice, and                         rectum were photographed. Findings:      The perianal and digital rectal examinations were normal. Pertinent       negatives include normal sphincter tone and no palpable rectal lesions.      The terminal ileum appeared normal. Biopsies were taken with a cold       forceps for histology.      Normal mucosa was found in the entire colon, in the left colon and in       the right colon. Biopsies for histology were taken with a cold forceps       from the entire colon for evaluation of microscopic colitis.      The retroflexed view of the distal rectum and anal verge was normal and       showed no anal or rectal abnormalities. Impression:            - The examined portion of the ileum was normal.                         Biopsied.                        - Normal mucosa in the entire examined  colon, in the                         left colon and in the right colon. Biopsied.                        - The distal rectum and anal verge are normal on                         retroflexion view. Recommendation:        - Discharge patient to home (with escort).                        - Resume previous diet today.                        - Continue present medications.                        - Await pathology results. Procedure Code(s):     --- Professional ---                        [phone], Colonoscopy, flexible; with biopsy, single or                         multiple Diagnosis Code(s):     --- Professional ---                        K52.9, Noninfective gastroenteritis and colitis,                         unspecified                        R19.7, Diarrhea, unspecified CPT copyright 2022 American Medical  Association. All rights reserved. The codes documented in this report are preliminary and upon coder review may  be revised to meet current compliance requirements. Dr. Corinn Brooklyn Corinn Jess Brooklyn MD, MD 04/14/2024 9:45:50 AM This report has been signed electronically. Number of Addenda: 0 Note Initiated On: 04/14/2024 9:03 AM Scope Withdrawal Time: 0 hours 13 minutes 38 seconds  Total Procedure Duration: 0 hours 16 minutes 28 seconds  Estimated Blood Loss:  Estimated blood loss: none.      [facility]

## 2024-04-14 NOTE — Anesthesia Postprocedure Evaluation (Signed)
 Anesthesia Post Note  Patient: Amy Wilcox  Procedure(s) Performed: COLONOSCOPY EGD (ESOPHAGOGASTRODUODENOSCOPY)  Patient location during evaluation: PACU Anesthesia Type: General Level of consciousness: awake and awake and alert Pain management: satisfactory to patient Vital Signs Assessment: post-procedure vital signs reviewed and stable Respiratory status: spontaneous breathing Cardiovascular status: stable Anesthetic complications: no   No notable events documented.   Last Vitals:  Vitals:   04/14/24 0950 04/14/24 1002  BP: 111/61 116/69  Pulse: 85 69  Resp: 16 17  Temp:    SpO2: 98% (!) 80%    Last Pain:  Vitals:   04/14/24 1002  TempSrc:   PainSc: 0-No pain                 VAN STAVEREN,Mahdiya Mossberg

## 2024-04-15 LAB — SURGICAL PATHOLOGY

## 2024-04-21 ENCOUNTER — Ambulatory Visit: Payer: Self-pay | Admitting: Gastroenterology

## 2024-04-22 DIAGNOSIS — K529 Noninfective gastroenteritis and colitis, unspecified: Secondary | ICD-10-CM | POA: Diagnosis not present

## 2024-04-22 DIAGNOSIS — K580 Irritable bowel syndrome with diarrhea: Secondary | ICD-10-CM | POA: Diagnosis not present

## 2024-06-10 NOTE — Progress Notes (Signed)
  Cardiology Office Note   Date:  06/14/2024  ID:  Amy Wilcox, DOB Jan 24, 1951, MRN [id number] PCP: Gretta Comer POUR, NP  [facility] Providers Cardiologist:  Caron Poser, MD     History of Present Illness Amy Wilcox is a 73 y.o. female PMH HLD, DM 2 who presents for further evaluation and management of abnormal coronary calcium score.  Patient presents for further evaluation of an abnormal calcium score.  She had a calcium score of 500 on 02/2024 with three-vessel calcium.  Her last LDL 126 02/2024.  She has a long history of statin intolerance and has trialed at least 5 different statins per her report.  She reports intermittent chest discomfort which happens about once per week.  This has been going on for about a year.  She says that it seems to happen after she has been exerting herself and subsides quickly after.  She has never had a contrasted CT scan before; she notes that her father and her son both have mild reactions to contrast dye (hives).  Interval history: Since last visit, we obtained a coronary CT angiogram which was CAD RADS 2 with mild stenosis of the LAD, ostial LCx, and ostial RCA.  She has been doing well and has had minimal chest discomfort since last visit.  She has been tolerating the Praluent  without issue.  No other complaints today.  Relevant CVD History -CCTA 04/2024 CAD RADS 2 with mild stenosis of LAD, ostial LCx, ostial RCA.  Aortic atherosclerosis. -CAC score 500 02/2024 with three-vessel calcium   ROS: Pt denies any chest discomfort, jaw pain, arm pain, palpitations, syncope, presyncope, orthopnea, PND, or LE edema.  Studies Reviewed I have independently reviewed the patient's ECG, CT scan from 02/2024, prior medical records, and recent blood work.  Physical Exam VS:  BP (!) 122/58 (BP Location: Left Arm, Patient Position: Sitting, Cuff Size: Normal)   Pulse (!) 58   Ht 5' 2 (1.575 m)   Wt 142 lb 6.4 oz (64.6 kg)   SpO2 99%   BMI 26.05 kg/m         Wt Readings from Last 3 Encounters:  06/14/24 142 lb 6.4 oz (64.6 kg)  04/14/24 144 lb (65.3 kg)  03/15/24 148 lb (67.1 kg)    GEN: No acute distress. NECK: No JVD; No carotid bruits. CARDIAC: RRR, no murmurs, rubs, gallops. RESPIRATORY:  Clear to auscultation. EXTREMITIES:  Warm and well-perfused. No edema.  ASSESSMENT AND PLAN Nonobstructive CAD Chest discomfort Coronary CT angiogram 04/2024 CAD RADS 2 with mild stenosis of the LAD, ostial LCx, ostial RCA.  Minimal residual chest discomfort.  Doing well.  Discussed ongoing aggressive preventive measures.  Plan: - Continue ASA 81 mg daily - Continue Zetia  10 mg daily - Continue Praluent   HLD Statin intolerant Aortic atherosclerosis Last LDL 126 02/2024.  History of statin intolerance with intolerance to a reported 5 different agents.  Significant ASCVD burden given evidence of CAD and aortic atherosclerosis.  Will shoot for LDL goal less than 70, less than 55 would be optimal.  Plan: - Continue Zetia  10 mg daily - Continue Praluent  - Recheck lipids today  HTN Well-controlled.  Continue valsartan -HCTZ 80-12.5 mg daily.        Dispo: RTC 12 months or sooner PRN  Signed, Caron Poser, MD

## 2024-06-14 ENCOUNTER — Ambulatory Visit

## 2024-06-14 ENCOUNTER — Telehealth: Payer: Self-pay | Admitting: Pharmacy Technician

## 2024-06-14 VITALS — BP 122/58 | HR 58 | Ht 62.0 in | Wt 142.4 lb

## 2024-06-14 DIAGNOSIS — I700 Atherosclerosis of aorta: Secondary | ICD-10-CM | POA: Diagnosis present

## 2024-06-14 DIAGNOSIS — R079 Chest pain, unspecified: Secondary | ICD-10-CM

## 2024-06-14 DIAGNOSIS — E782 Mixed hyperlipidemia: Secondary | ICD-10-CM | POA: Diagnosis present

## 2024-06-14 DIAGNOSIS — I2510 Atherosclerotic heart disease of native coronary artery without angina pectoris: Secondary | ICD-10-CM | POA: Diagnosis present

## 2024-06-14 DIAGNOSIS — Z789 Other specified health status: Secondary | ICD-10-CM | POA: Diagnosis present

## 2024-06-14 MED ORDER — PRALUENT 75 MG/ML ~~LOC~~ SOAJ: 75.0000 mg | SUBCUTANEOUS | 3 refills | Status: DC

## 2024-06-14 NOTE — Patient Instructions (Addendum)
 Medication Instructions:  Your physician recommends that you continue on your current medications as directed. Please refer to the Current Medication list given to you today.  *If you need a refill on your cardiac medications before your next appointment, please call your pharmacy*  Lab Work: Your provider would like for you to have following labs drawn today LIPID PANEL.   If you have labs (blood work) drawn today and your tests are completely normal, you will receive your results only by: MyChart Message (if you have MyChart) OR A paper copy in the mail If you have any lab test that is abnormal or we need to change your treatment, we will call you to review the results.  Testing/Procedures: No test ordered today   Follow-Up: At [facility], you and your health needs are our priority.  As part of our continuing mission to provide you with exceptional heart care, our providers are all part of one team.  This team includes your primary Cardiologist (physician) and Advanced Practice Providers or APPs (Physician Assistants and Nurse Practitioners) who all work together to provide you with the care you need, when you need it.  Your next appointment:   12 month(s)  Provider:   Caron Poser, MD   We recommend signing up for the patient portal called MyChart.  Sign up information is provided on this After Visit Summary.  MyChart is used to connect with patients for Virtual Visits (Telemedicine).  Patients are able to view lab/test results, encounter notes, upcoming appointments, etc.  Non-urgent messages can be sent to your provider as well.   To learn more about what you can do with MyChart, go to [email or web address].

## 2024-06-14 NOTE — Telephone Encounter (Signed)
 Patient called and asked for hw grant info be given to [facility]   Effective: 02/14/24 - 02/12/25   APW:[id number] ERW:[id number] Hmnle:[id number] PI:[id number] [employer] ID: [id number]  I called [facility] and gave hw grant info and now free

## 2024-06-15 ENCOUNTER — Ambulatory Visit: Payer: Self-pay

## 2024-06-15 LAB — LIPID PANEL
Chol/HDL Ratio: 2.7 ratio (ref 0.0–4.4)
Cholesterol, Total: 116 mg/dL (ref 100–199)
HDL: 43 mg/dL (ref >39)
LDL Chol Calc (NIH): 46 mg/dL (ref 0–99)
Triglycerides: 162 mg/dL — ABNORMAL HIGH (ref 0–149)
VLDL Cholesterol Cal: 27 mg/dL (ref 5–40)

## 2024-06-17 ENCOUNTER — Other Ambulatory Visit: Payer: Self-pay

## 2024-06-17 DIAGNOSIS — R079 Chest pain, unspecified: Secondary | ICD-10-CM

## 2024-06-17 DIAGNOSIS — I2510 Atherosclerotic heart disease of native coronary artery without angina pectoris: Secondary | ICD-10-CM

## 2024-06-17 DIAGNOSIS — E782 Mixed hyperlipidemia: Secondary | ICD-10-CM

## 2024-06-18 ENCOUNTER — Other Ambulatory Visit: Payer: Self-pay

## 2024-06-18 MED FILL — Alirocumab Subcutaneous Solution Auto-Injector 75 MG/ML: SUBCUTANEOUS | 28 days supply | Qty: 2 | Fill #0 | Status: AC

## 2024-07-05 ENCOUNTER — Other Ambulatory Visit: Payer: Self-pay

## 2024-07-05 MED FILL — Alirocumab Subcutaneous Solution Auto-Injector 75 MG/ML: SUBCUTANEOUS | 28 days supply | Qty: 2 | Fill #1 | Status: CN

## 2024-07-08 MED FILL — Alirocumab Subcutaneous Solution Auto-Injector 75 MG/ML: SUBCUTANEOUS | 28 days supply | Qty: 2 | Fill #1 | Status: AC

## 2024-07-19 ENCOUNTER — Other Ambulatory Visit: Payer: Self-pay

## 2024-07-19 ENCOUNTER — Emergency Department: Admission: EM | Admit: 2024-07-19 | Discharge: 2024-07-19 | Disposition: A | Discharge status: Home / Self Care

## 2024-07-19 ENCOUNTER — Emergency Department

## 2024-07-19 DIAGNOSIS — K529 Noninfective gastroenteritis and colitis, unspecified: Secondary | ICD-10-CM | POA: Diagnosis not present

## 2024-07-19 DIAGNOSIS — R197 Diarrhea, unspecified: Secondary | ICD-10-CM | POA: Diagnosis present

## 2024-07-19 LAB — COMPREHENSIVE METABOLIC PANEL WITH GFR
ALT: 14 U/L (ref 0–44)
AST: 17 U/L (ref 15–41)
Albumin: 4.8 g/dL (ref 3.5–5.0)
Alkaline Phosphatase: 42 U/L (ref 38–126)
Anion gap: 12 (ref 5–15)
BUN: 22 mg/dL (ref 8–23)
CO2: 27 mmol/L (ref 22–32)
Calcium: 10.4 mg/dL — ABNORMAL HIGH (ref 8.9–10.3)
Chloride: 103 mmol/L (ref 98–111)
Creatinine, Ser: 0.84 mg/dL (ref 0.44–1.00)
GFR, Estimated: >60 mL/min
Glucose, Bld: 132 mg/dL — ABNORMAL HIGH (ref 70–99)
Potassium: 3.8 mmol/L (ref 3.5–5.1)
Sodium: 142 mmol/L (ref 135–145)
Total Bilirubin: 0.6 mg/dL (ref 0.0–1.2)
Total Protein: 7.5 g/dL (ref 6.5–8.1)

## 2024-07-19 LAB — CBC
HCT: 44.5 % (ref 36.0–46.0)
Hemoglobin: 15.5 g/dL — ABNORMAL HIGH (ref 12.0–15.0)
MCH: 30.3 pg (ref 26.0–34.0)
MCHC: 34.8 g/dL (ref 30.0–36.0)
MCV: 87.1 fL (ref 80.0–100.0)
Platelets: 227 K/uL (ref 150–400)
RBC: 5.11 MIL/uL (ref 3.87–5.11)
RDW: 12.5 % (ref 11.5–15.5)
WBC: 8.2 K/uL (ref 4.0–10.5)
nRBC: 0 % (ref 0.0–0.2)

## 2024-07-19 LAB — URINALYSIS, ROUTINE W REFLEX MICROSCOPIC
Bacteria, UA: NONE SEEN
Bilirubin Urine: NEGATIVE
Glucose, UA: NEGATIVE mg/dL
Ketones, ur: NEGATIVE mg/dL
Leukocytes,Ua: NEGATIVE
Nitrite: NEGATIVE
Protein, ur: NEGATIVE mg/dL
RBC / HPF: 0 RBC/hpf (ref 0–5)
Specific Gravity, Urine: 1.023 (ref 1.005–1.030)
pH: 5 (ref 5.0–8.0)

## 2024-07-19 LAB — LIPASE, BLOOD: Lipase: 23 U/L (ref 11–51)

## 2024-07-19 MED ORDER — AMOXICILLIN-POT CLAVULANATE 875-125 MG PO TABS
1.0000 | ORAL_TABLET | Freq: Two times a day (BID) | ORAL | 0 refills | Status: AC
  Filled 2024-07-19: qty 14, 7d supply, fill #0

## 2024-07-19 MED ORDER — IOHEXOL 300 MG/ML  SOLN
100.0000 mL | Freq: Once | INTRAMUSCULAR | Status: AC | PRN
  Administered 2024-07-19: 100 mL via INTRAVENOUS

## 2024-07-19 NOTE — ED Notes (Signed)
 This RN present for rectal exam with MD Fernand. Following exam, pt repositioned in bed and give 2 warm blankets, and call light placed in pt arms reach.

## 2024-07-19 NOTE — ED Provider Notes (Signed)
 "  [facility] Provider Note    Event Date/Time   First MD Initiated Contact with Patient 07/19/24 [phone]     (approximate)   History   Diarrhea   HPI  Amy Wilcox is a 74 y.o. female presenting with concern of rectal bleeding.  She has a history of underlying IBS in the past, this has been controlled fairly well with her diet, yesterday she ate something she was not supposed to eat, since then having watery diarrhea and abdominal cramping.  She states that this feels a little bit different from her normal abdominal cramping that she develops when she changes her diet.  She had multiple watery bowel movements overnight and then this was followed by 2 episodes of bloody bowel movements.  Bowel movements described as primarily bloody but 2 tablespoons of blood without any mixed in stool.  Denies any lightheadedness shortness of breath with the symptoms.  She does have a history of hemorrhoids before in the past, denies any rectal pain or pain with bowel movements.  She did have a recent colonoscopy done, I reviewed her recent GI note as well as results of the colonoscopy from April 14, 2024, which did not demonstrate evidence of significant GI pathology and continue with plan for management with FODMAP diet.     Physical Exam   Triage Vital Signs: ED Triage Vitals  Encounter Vitals Group     BP 07/19/24 0814 (!) 151/86     Girls Systolic BP Percentile --      Girls Diastolic BP Percentile --      Boys Systolic BP Percentile --      Boys Diastolic BP Percentile --      Pulse Rate 07/19/24 0814 75     Resp 07/19/24 0814 16     Temp 07/19/24 0814 98.6 F (37 C)     Temp src --      SpO2 07/19/24 0814 99 %     Weight 07/19/24 0813 141 lb (64 kg)     Height 07/19/24 0813 5' 3 (1.6 m)     Head Circumference --      Peak Flow --      Pain Score 07/19/24 0813 2     Pain Loc --      Pain Education --      Exclude from Growth Chart --     Most recent vital  signs: Vitals:   07/19/24 0814 07/19/24 1306  BP: (!) 151/86 (!) 150/82  Pulse: 75 75  Resp: 16 16  Temp: 98.6 F (37 C) 98.5 F (36.9 C)  SpO2: 99% 99%     General: Awake, no distress.  CV:  Good peripheral perfusion.  Resp:  Normal effort.  Abd:  No distention.  Soft nontender Other:     ED Results / Procedures / Treatments   Labs (all labs ordered are listed, but only abnormal results are displayed) Labs Reviewed  COMPREHENSIVE METABOLIC PANEL WITH GFR - Abnormal; Notable for the following components:      Result Value   Glucose, Bld 132 (*)    Calcium 10.4 (*)    All other components within normal limits  CBC - Abnormal; Notable for the following components:   Hemoglobin 15.5 (*)    All other components within normal limits  URINALYSIS, ROUTINE W REFLEX MICROSCOPIC - Abnormal; Notable for the following components:   Color, Urine YELLOW (*)    APPearance CLEAR (*)    Hgb urine dipstick  SMALL (*)    All other components within normal limits  LIPASE, BLOOD     EKG     RADIOLOGY   PROCEDURES:  Critical Care performed: No  Procedures   MEDICATIONS ORDERED IN ED: Medications  iohexol  (OMNIPAQUE ) 300 MG/ML solution 100 mL (100 mLs Intravenous Contrast Given 07/19/24 1206)     IMPRESSION / MDM / ASSESSMENT AND PLAN / ED COURSE  I reviewed the triage vital signs and the nursing notes.                               Patient's presentation is most consistent with acute complicated illness / injury requiring diagnostic workup.  74 year old female presents today with concern of rectal bleeding.  Described as bright red blood along with multiple episodes of diarrhea.  She appears well she is not in any acute distress her vitals here are reassuring, hemoglobin of 15.5 here.  Given the presentation suspect likely a hemorrhoidal bleed, she did just have a colonoscopy a few days ago.  Stool was not described as melanotic.  Will follow-up imaging given her  complaint of abdominal cramping, but I suspect this is likely dietary changes.   Clinical Course as of 07/19/24 1541  Mon Jul 19, 2024  9065 Bright red blood appreciate on patient's rectal exam, there is multiple large hemorrhoids no active bleeding noted.  Awaiting CT imaging results, however anticipate likely hemorrhoidal bleed resulting in the symptoms secondary to her multiple episodes of diarrhea.  Believe will be reasonable for discharge home given her reassuring CT. [SK]  1540 Patient CT imaging demonstrating evidence of colitis, she appears well she has not had any further bowel movements while here, feel unlikely acute GI bleed, and we have reasonable for discharge home with outpatient follow-up with gastroenterology.  Will place her on a brief course of Augmentin .  I discussed return precautions, we will have her discharged home at this time. [SK]    Clinical Course User Index [SK] Fernand Rossie HERO, MD     FINAL CLINICAL IMPRESSION(S) / ED DIAGNOSES   Final diagnoses:  Colitis     Rx / DC Orders   ED Discharge Orders          Ordered    amoxicillin -clavulanate (AUGMENTIN ) 875-125 MG tablet  2 times daily        07/19/24 1425             Note:  This document was prepared using Dragon voice recognition software and may include unintentional dictation errors.   Fernand Rossie HERO, MD 07/19/24 1541  "

## 2024-07-19 NOTE — ED Triage Notes (Signed)
 Pt to ED for diarrhea started this am with blood noted. Reports has been changing diet for IBS. +abd cramping

## 2024-07-19 NOTE — Discharge Instructions (Signed)
 You were seen today due to concern of diarrhea and blood in your stool.  At this time fortunately your labs are reassuring.  Your CT scan demonstrates some evidence of inflammation in your colon, I have written for some antibiotics for you to take, please take these as instructed.  Please be sure to follow-up with your primary doctor as well as your gastroenterologist.  If you have any worsening of symptoms such as abdominal pain, increased bleeding, lightheadedness, shortness of breath, or any other symptoms you find concerning please return to the emergency department immediately for further medical management.

## 2024-07-20 ENCOUNTER — Ambulatory Visit
Admission: RE | Admit: 2024-07-20 | Discharge: 2024-07-20 | Disposition: A | Source: Ambulatory Visit | Attending: Primary Care

## 2024-07-20 ENCOUNTER — Ambulatory Visit
Admission: RE | Admit: 2024-07-20 | Discharge: 2024-07-20 | Disposition: A | Discharge status: Home / Self Care | Source: Ambulatory Visit | Attending: Primary Care | Admitting: Primary Care

## 2024-07-20 DIAGNOSIS — Z1231 Encounter for screening mammogram for malignant neoplasm of breast: Secondary | ICD-10-CM | POA: Insufficient documentation

## 2024-07-20 DIAGNOSIS — E2839 Other primary ovarian failure: Secondary | ICD-10-CM | POA: Diagnosis present

## 2024-08-08 MED FILL — Alirocumab Subcutaneous Solution Auto-Injector 75 MG/ML: SUBCUTANEOUS | 28 days supply | Qty: 2 | Fill #2 | Status: AC

## 2024-08-13 ENCOUNTER — Other Ambulatory Visit: Payer: Self-pay

## 2024-08-26 ENCOUNTER — Ambulatory Visit: Admitting: Primary Care

## 2025-02-18 ENCOUNTER — Ambulatory Visit
